# Patient Record
Sex: Female | Born: 1955
Health system: Southern US, Community
[De-identification: ages and names within clinical notes are randomized; demographics above are authoritative.]

## PROBLEM LIST (undated history)

## (undated) DIAGNOSIS — J189 Pneumonia, unspecified organism: Secondary | ICD-10-CM

## (undated) DIAGNOSIS — I10 Essential (primary) hypertension: Secondary | ICD-10-CM

## (undated) DIAGNOSIS — F411 Generalized anxiety disorder: Secondary | ICD-10-CM

## (undated) DIAGNOSIS — G459 Transient cerebral ischemic attack, unspecified: Secondary | ICD-10-CM

## (undated) DIAGNOSIS — J90 Pleural effusion, not elsewhere classified: Secondary | ICD-10-CM

## (undated) DIAGNOSIS — M1990 Unspecified osteoarthritis, unspecified site: Secondary | ICD-10-CM

## (undated) DIAGNOSIS — I700 Atherosclerosis of aorta: Secondary | ICD-10-CM

## (undated) DIAGNOSIS — R0609 Other forms of dyspnea: Secondary | ICD-10-CM

## (undated) DIAGNOSIS — C3492 Malignant neoplasm of unspecified part of left bronchus or lung: Secondary | ICD-10-CM

## (undated) DIAGNOSIS — E785 Hyperlipidemia, unspecified: Secondary | ICD-10-CM

## (undated) HISTORY — PX: NEPHRECTOMY: SHX65

## (undated) HISTORY — DX: Pleural effusion, not elsewhere classified: J90

## (undated) HISTORY — DX: Other forms of dyspnea: R06.09

## (undated) HISTORY — DX: Pneumonia, unspecified organism: J18.9

## (undated) HISTORY — DX: Malignant neoplasm of unspecified part of left bronchus or lung: C34.92

## (undated) HISTORY — DX: Transient cerebral ischemic attack, unspecified: G45.9

## (undated) HISTORY — DX: Atherosclerosis of aorta: I70.0

## (undated) HISTORY — DX: Hyperlipidemia, unspecified: E78.5

## (undated) HISTORY — DX: Essential (primary) hypertension: I10

## (undated) HISTORY — DX: Generalized anxiety disorder: F41.1

---

## 1997-10-05 ENCOUNTER — Emergency Department (HOSPITAL_COMMUNITY): Admission: EM | Admit: 1997-10-05 | Discharge: 1997-10-05 | Payer: Self-pay | Admitting: *Deleted

## 1999-09-08 ENCOUNTER — Other Ambulatory Visit: Admission: RE | Admit: 1999-09-08 | Discharge: 1999-09-08 | Payer: Self-pay | Admitting: Obstetrics and Gynecology

## 1999-09-17 ENCOUNTER — Ambulatory Visit (HOSPITAL_COMMUNITY): Admission: RE | Admit: 1999-09-17 | Discharge: 1999-09-17 | Payer: Self-pay | Admitting: Gastroenterology

## 1999-09-17 ENCOUNTER — Encounter: Payer: Self-pay | Admitting: Obstetrics & Gynecology

## 2016-04-29 DIAGNOSIS — Z1231 Encounter for screening mammogram for malignant neoplasm of breast: Secondary | ICD-10-CM | POA: Diagnosis not present

## 2016-04-29 DIAGNOSIS — M8589 Other specified disorders of bone density and structure, multiple sites: Secondary | ICD-10-CM | POA: Diagnosis not present

## 2016-04-29 DIAGNOSIS — M8588 Other specified disorders of bone density and structure, other site: Secondary | ICD-10-CM | POA: Diagnosis not present

## 2016-08-03 DIAGNOSIS — H6123 Impacted cerumen, bilateral: Secondary | ICD-10-CM | POA: Diagnosis not present

## 2016-09-17 DIAGNOSIS — N3090 Cystitis, unspecified without hematuria: Secondary | ICD-10-CM | POA: Diagnosis not present

## 2016-09-17 DIAGNOSIS — N3001 Acute cystitis with hematuria: Secondary | ICD-10-CM | POA: Diagnosis not present

## 2016-10-06 DIAGNOSIS — Z1389 Encounter for screening for other disorder: Secondary | ICD-10-CM | POA: Diagnosis not present

## 2016-10-06 DIAGNOSIS — I10 Essential (primary) hypertension: Secondary | ICD-10-CM | POA: Diagnosis not present

## 2016-10-06 DIAGNOSIS — E785 Hyperlipidemia, unspecified: Secondary | ICD-10-CM | POA: Diagnosis not present

## 2016-10-06 DIAGNOSIS — E119 Type 2 diabetes mellitus without complications: Secondary | ICD-10-CM | POA: Diagnosis not present

## 2017-01-25 DIAGNOSIS — H524 Presbyopia: Secondary | ICD-10-CM | POA: Diagnosis not present

## 2017-01-25 DIAGNOSIS — H2512 Age-related nuclear cataract, left eye: Secondary | ICD-10-CM | POA: Diagnosis not present

## 2017-02-25 DIAGNOSIS — N3001 Acute cystitis with hematuria: Secondary | ICD-10-CM | POA: Diagnosis not present

## 2017-03-03 DIAGNOSIS — N210 Calculus in bladder: Secondary | ICD-10-CM | POA: Diagnosis not present

## 2017-03-03 DIAGNOSIS — N1330 Unspecified hydronephrosis: Secondary | ICD-10-CM | POA: Insufficient documentation

## 2017-03-04 DIAGNOSIS — N1330 Unspecified hydronephrosis: Secondary | ICD-10-CM | POA: Diagnosis not present

## 2017-03-04 DIAGNOSIS — N210 Calculus in bladder: Secondary | ICD-10-CM | POA: Diagnosis not present

## 2017-03-04 DIAGNOSIS — R319 Hematuria, unspecified: Secondary | ICD-10-CM | POA: Diagnosis not present

## 2017-03-23 DIAGNOSIS — Z22322 Carrier or suspected carrier of Methicillin resistant Staphylococcus aureus: Secondary | ICD-10-CM | POA: Insufficient documentation

## 2017-03-30 DIAGNOSIS — N1330 Unspecified hydronephrosis: Secondary | ICD-10-CM | POA: Diagnosis not present

## 2017-11-05 DIAGNOSIS — F411 Generalized anxiety disorder: Secondary | ICD-10-CM | POA: Diagnosis not present

## 2017-11-05 DIAGNOSIS — E559 Vitamin D deficiency, unspecified: Secondary | ICD-10-CM | POA: Diagnosis not present

## 2017-11-05 DIAGNOSIS — D539 Nutritional anemia, unspecified: Secondary | ICD-10-CM | POA: Diagnosis not present

## 2017-11-05 DIAGNOSIS — E119 Type 2 diabetes mellitus without complications: Secondary | ICD-10-CM | POA: Diagnosis not present

## 2017-11-05 DIAGNOSIS — E785 Hyperlipidemia, unspecified: Secondary | ICD-10-CM | POA: Diagnosis not present

## 2017-11-05 DIAGNOSIS — I10 Essential (primary) hypertension: Secondary | ICD-10-CM | POA: Diagnosis not present

## 2018-05-09 DIAGNOSIS — I10 Essential (primary) hypertension: Secondary | ICD-10-CM | POA: Diagnosis not present

## 2018-05-09 DIAGNOSIS — E119 Type 2 diabetes mellitus without complications: Secondary | ICD-10-CM | POA: Diagnosis not present

## 2018-05-09 DIAGNOSIS — D539 Nutritional anemia, unspecified: Secondary | ICD-10-CM | POA: Diagnosis not present

## 2018-05-09 DIAGNOSIS — Z1331 Encounter for screening for depression: Secondary | ICD-10-CM | POA: Diagnosis not present

## 2018-05-09 DIAGNOSIS — E785 Hyperlipidemia, unspecified: Secondary | ICD-10-CM | POA: Diagnosis not present

## 2018-07-22 DIAGNOSIS — Z1231 Encounter for screening mammogram for malignant neoplasm of breast: Secondary | ICD-10-CM | POA: Diagnosis not present

## 2018-07-22 DIAGNOSIS — M85851 Other specified disorders of bone density and structure, right thigh: Secondary | ICD-10-CM | POA: Diagnosis not present

## 2018-07-22 DIAGNOSIS — M8588 Other specified disorders of bone density and structure, other site: Secondary | ICD-10-CM | POA: Diagnosis not present

## 2018-07-22 DIAGNOSIS — M8589 Other specified disorders of bone density and structure, multiple sites: Secondary | ICD-10-CM | POA: Diagnosis not present

## 2018-11-15 DIAGNOSIS — E785 Hyperlipidemia, unspecified: Secondary | ICD-10-CM | POA: Diagnosis not present

## 2018-11-15 DIAGNOSIS — I10 Essential (primary) hypertension: Secondary | ICD-10-CM | POA: Diagnosis not present

## 2018-11-15 DIAGNOSIS — E119 Type 2 diabetes mellitus without complications: Secondary | ICD-10-CM | POA: Diagnosis not present

## 2018-11-15 DIAGNOSIS — E559 Vitamin D deficiency, unspecified: Secondary | ICD-10-CM | POA: Diagnosis not present

## 2018-11-15 DIAGNOSIS — F411 Generalized anxiety disorder: Secondary | ICD-10-CM | POA: Diagnosis not present

## 2019-06-16 LAB — COLOGUARD: COLOGUARD: NEGATIVE

## 2019-12-08 ENCOUNTER — Institutional Professional Consult (permissible substitution): Payer: Self-pay | Admitting: Emergency Medicine

## 2019-12-11 ENCOUNTER — Ambulatory Visit (INDEPENDENT_AMBULATORY_CARE_PROVIDER_SITE_OTHER): Payer: Medicare Other | Admitting: Pulmonary Disease

## 2019-12-11 ENCOUNTER — Other Ambulatory Visit: Payer: Self-pay

## 2019-12-11 ENCOUNTER — Encounter: Payer: Self-pay | Admitting: Pulmonary Disease

## 2019-12-11 VITALS — BP 124/72 | HR 93 | Temp 97.8°F | Ht 64.5 in | Wt 169.2 lb

## 2019-12-11 DIAGNOSIS — C3492 Malignant neoplasm of unspecified part of left bronchus or lung: Secondary | ICD-10-CM | POA: Diagnosis not present

## 2019-12-11 DIAGNOSIS — J910 Malignant pleural effusion: Secondary | ICD-10-CM

## 2019-12-11 NOTE — Patient Instructions (Addendum)
- We will refer you to medical oncology for further evaluation and treatment of lung cancer - We will schedule you for CT PET scan and Brain MRI at [facility] - Should your shortness of breath worsen we can perform repeat thoracentesis or place a PleurX catheter to remove fluid    Indwelling Pleural Catheter Home Guide  An indwelling pleural catheter is a thin, flexible tube that is inserted under your skin and into your chest. The catheter drains excess fluid that collects in the area between the chest wall and the lungs (pleural space).After the catheter is inserted, it can be attached to a bottle that collects fluid. The pleural catheter will allow you to drain fluid from your chest at home on a regular basis (sometimes daily). This will eliminate the need for frequent visits to the hospital or clinic to drain the fluid. The catheter may be removed after the excess fluid problem is resolved, usually after 2-3 months. It is important to follow instructions from your health care provider about how to drain and care for your catheter. What are the risks? Generally, this is a safe procedure. However, problems may occur, including:  Infection.  Skin damage around the catheter.  Lung damage.  Failure of the chest tube to work properly.  Spreading of cancer cells along the catheter, if you have cancer. Supplies needed:  Vacuum-sealed drainage bottle with attached drainage line.  Sterile dressing.  Sterile alcohol pads.  Sterile gloves.  Valve cap.  Sterile gauze pads, 4  4 inch (10 cm  10 cm).  Tape.  Adhesive dressing.  Sterile foam catheter pad. How to care for your catheter and insertion site  Wash your hands with soap and warm water before and after touching the catheter or insertion site. If soap and water are not available, use hand sanitizer.  Check your bandage (dressing) daily to make sure it is clean and dry.  Keep the skin around the catheter clean  and dry.  Check the catheter regularly for any cracks or kinks in the tubing.  Check your catheter insertion site every day for signs of infection. Check for: ? Skin breakdown. ? Redness, swelling, or pain. ? Fluid or blood. ? Warmth. ? Pus or a bad smell. How to drain your catheter You may need to drain your catheter every day, or more or less often as told by your health care provider. Follow instructions from your health care provider about how to drain your catheter. You may also refer to instructions that come with the drainage system. To drain the catheter: 1. Wash your hands with soap and warm water. If soap and water are not available, use hand sanitizer. 2. Carefully remove the dressing from around the catheter. 3. Wash your hands again. 4. Put on the gloves provided. 5. Prepare the vacuum-sealed drainage bottle and drainage line. Close the drainage line of the vacuum-sealed drainage bottle by squeezing the pinch clamp or rolling the wheel of the roller clamp toward the bottle. The vacuum in the bottle will be lost if the line is not closed completely. 6. Remove the access tip cover from the drainage line. Do not touch the end. Set it on a sterile surface. 7. Remove the catheter valve cap and throw it away. 8. Use an alcohol pad to clean the end of the catheter. 9. Insert the access tip into the catheter valve. Make sure the valve and access tip are securely connected. Listen for a click to confirm that  they are connected. 10. Insert the T plunger to break the vacuum seal on the drainage bottle. 11. Open the clamp on the drainage line. 12. Allow the catheter to drain. Keep the catheter and the drainage bottle below the level of your chest. There may be a one-way valve on the end of the tubing that will allow liquid and air to flow out of the catheter without letting air inside. 13. Drain the amount of fluid as told by your health care provider. It usually takes 5-15 minutes. Do not  drain more than 1000 mL of fluid. You may feel a little discomfort while you are draining. If the pain is severe, stop draining and contact your health care provider. 14. After you finish draining the catheter, remove the drainage bottle tubing from the catheter. 15. Use a clean alcohol pad to wipe the catheter tip. 16. Place a clean cap on the end of the catheter. 17. Use an alcohol pad to clean the skin around the catheter. 18. Allow the skin to air-dry. 19. Put the catheter pad on your skin. Curl the catheter into loops and place it on the pad. Do not place the catheter on your skin. 20. Replace the dressing over the catheter. 21. Discard the drainage bottle as instructed by your health care provider. Do not reuse the drainage bottle. How to change your dressing Change your dressing at least once a week, or more often if needed to keep the dressing dry. Be sure to change the dressing whenever it becomes moist. Your health care provider will tell you how often to change your dressing. 1. Wash your hands with soap and warm water. If soap and water are not available, use hand sanitizer. 2. Gently remove the old dressing. Avoid using scissors to remove the dressing. Sharp objects may damage the catheter. 3. Wash the skin around the insertion site with mild, fragrance-free soap and warm water. Rinse well, then pat the area dry with a clean cloth. 4. Check the skin around the catheter for signs of infection. Check for: ? Skin breakdown. ? Redness, swelling, or pain. ? Fluid or blood. ? Warmth. ? Pus or a bad smell. 5. If your catheter was stitched (sutured) to your skin, look at the suture to make sure it is still anchored in your skin. 6. Do not apply creams, ointments, or alcohol to the area. Let your skin air-dry completely before you apply a new dressing. 7. Curl the catheter into loops and place it on the sterile catheter pad. Do not place the catheter on your skin. 8. If you do not have a  pad, use a clean dressing. Slide the dressing under the disk that holds the drainage catheter in place. 9. Use gauze to cover the catheter and the catheter pad. The catheter should rest on the pad or dressing, not on your skin. 10. Tape the dressing to your skin. You may be instructed to use an adhesive dressing covering instead of gauze and tape. 11. Wash your hands with soap and warm water. If soap and water are not available, use hand sanitizer. General recommendations  Always wash your hands with soap and warm water before and after caring for your catheter and drainage bottle. Use a mild, fragrance-free soap. If soap and water are not available, use hand sanitizer.  Always make sure there are no leaks in the catheter or drainage bottle.  Each time you drain the catheter, note the color and amount of fluid.  Do not  touch the tip of the catheter or the drainage bottle tubing.  Do not reuse drainage bottles.  Do not take baths, swim, or use a hot tub until your health care provider approves. Ask your health care provider if you may take showers. You may only be allowed to take sponge baths.  Take deep breaths regularly, followed by a cough. Doing this can help to prevent lung infection. Contact a health care provider if:  You have any questions about caring for your catheter or drainage bottle.  You still have pain at the catheter insertion site more than 2 days after your procedure.  You have pain while draining your catheter.  Your catheter becomes bent, twisted, or cracked.  The connection between the catheter and the collection bottle becomes loose.  You have any of these around your catheter insertion site or coming from it: ? Skin breakdown. ? Redness, swelling, or pain. ? Fluid or blood. ? Warmth. ? Pus or a bad smell. Get help right away if:  You have a fever or chills.  You have chest pain.  You have dizziness or shortness of breath.  You have severe redness,  swelling, or pain at your catheter insertion site.  The catheter comes out.  The catheter is blocked or clogged. Summary  An indwelling pleural catheter is a thin, flexible tube that is inserted under your skin and into your chest. The catheter drains excess fluid that collects in the area between the chest wall and the lungs (pleural space).  It is important to follow instructions from your health care provider about how to drain and care for your catheter.  Do not touch the tip of the catheter or the drainage bottle tubing.  Always wash your hands with soap and water before and after caring for your catheter and drainage bottle. If soap and water are not available, use hand sanitizer. This information is not intended to replace advice given to you by your health care provider. Make sure you discuss any questions you have with your health care provider. Document Revised: 06/17/2018 Document Reviewed: 06/18/2016 Elsevier Patient Education  [place].

## 2019-12-11 NOTE — Progress Notes (Signed)
Synopsis: Referred by Laverna Peace, NP for Lung Cancer  Subjective:   PATIENT ID: Gabrielle [name] GENDER: female DOB: 1955/09/16, MRN: [id number]   HPI  Chief Complaint  Patient presents with  . Consult    had 1L of fluid drained from left lung, belives she was told it was cancer increased shortness of breath with more activity but usually O2 sats stay 93-96% denies any coughing    Gabrielle [name] is a 64 year old woman, former smoker with history of hypertension and hyperlipidemia who is referred to pulmonary clinic for adenocarcinoma of the lung.   Patient reports developing shortness of breath with exertion in August and went to her primary care in September for evaluation. Chest radiograph was performed on 11/23/19 which showed large left pleural effusion with compressive atelectasis. She then had ultrasound guided thoracentesis by IR on 11/27/19 with drainage of 1,268mL of dark maroon colored fluid. Pathology report on 12/01/19 resulted with malignant cells consistent with adenocarcinoma of the lung.   She reports experiencing some relief of her shortness of breath after the thoracentesis. She currently experiences dyspnea with exertion but she is not limited from completing her daily activities. She denies cough, hemoptysis or wheezing. She denies chest discomfort and lower extremity edema. She denies fevers, chills or sweats. She has lost 30lbs this year and reports a persistent 'full' feeling in her abdomen, but she has also intentionally modified her diet as she has tried to work on losing weight over this year.  She is a former smoker. She quit 5 years ago. She smoked 1 pack per day from age 36 to 28, so she has a 39 pack year smoking history. She has no history of hazardous occupational exposures.    Past Medical History:  Diagnosis Date  . Hyperlipidemia   . Hypertension   . Pleural effusion   . Pneumonia      Family History  Problem Relation Age of Onset  . COPD Mother        Social History   Socioeconomic History  . Marital status: Married    Spouse name: Not on file  . Number of children: Not on file  . Years of education: Not on file  . Highest education level: Not on file  Occupational History  . Not on file  Tobacco Use  . Smoking status: Former Smoker    Packs/day: 1.00    Years: 40.00    Pack years: 40.00    Types: Cigarettes    Quit date: 2016    Years since quitting: 5.7  . Smokeless tobacco: Never Used  Substance and Sexual Activity  . Alcohol use: Not on file  . Drug use: Not on file  . Sexual activity: Not on file  Other Topics Concern  . Not on file  Social History Narrative  . Not on file   Social Determinants of Health   Financial Resource Strain:   . Difficulty of Paying Living Expenses: Not on file  Food Insecurity:   . Worried About Charity fundraiser in the Last Year: Not on file  . Ran Out of Food in the Last Year: Not on file  Transportation Needs:   . Lack of Transportation (Medical): Not on file  . Lack of Transportation (Non-Medical): Not on file  Physical Activity:   . Days of Exercise per Week: Not on file  . Minutes of Exercise per Session: Not on file  Stress:   . Feeling of Stress : Not  on file  Social Connections:   . Frequency of Communication with Friends and Family: Not on file  . Frequency of Social Gatherings with Friends and Family: Not on file  . Attends Religious Services: Not on file  . Active Member of Clubs or Organizations: Not on file  . Attends Archivist Meetings: Not on file  . Marital Status: Not on file  Intimate Partner Violence:   . Fear of Current or Ex-Partner: Not on file  . Emotionally Abused: Not on file  . Physically Abused: Not on file  . Sexually Abused: Not on file     Allergies  Allergen Reactions  . Ace Inhibitors Cough     Outpatient Medications Prior to Visit  Medication Sig Dispense Refill  . cholecalciferol (VITAMIN D3) 25 MCG (1000 UNIT) tablet  Take 2,000 Units by mouth daily.    Marland Kitchen losartan-hydrochlorothiazide (HYZAAR) 100-12.5 MG tablet Take 1 tablet by mouth daily.    . metFORMIN (GLUCOPHAGE) 500 MG tablet Take 500 mg by mouth 2 (two) times daily with a meal.    . pravastatin (PRAVACHOL) 40 MG tablet Take 40 mg by mouth daily.    . sertraline (ZOLOFT) 50 MG tablet Take 75 mg by mouth daily.     No facility-administered medications prior to visit.    Review of Systems  Constitutional: Positive for weight loss. Negative for chills, diaphoresis, fever and malaise/fatigue.  HENT: Negative for congestion, nosebleeds, sinus pain and sore throat.   Eyes: Negative for blurred vision.  Respiratory: Positive for shortness of breath. Negative for cough, hemoptysis, sputum production and wheezing.   Cardiovascular: Negative for chest pain, palpitations, orthopnea, claudication, leg swelling and PND.  Gastrointestinal: Negative for abdominal pain, blood in stool, constipation, diarrhea, heartburn, melena, nausea and vomiting.  Genitourinary: Negative for dysuria and hematuria.  Musculoskeletal: Negative for joint pain and myalgias.  Skin: Negative for itching and rash.  Neurological: Negative for dizziness and weakness.  Endo/Heme/Allergies: Does not bruise/bleed easily.  Psychiatric/Behavioral: Negative.    Objective:   Vitals:   12/11/19 1527  BP: 124/72  Pulse: 93  Temp: 97.8 F (36.6 C)  TempSrc: Temporal  SpO2: 94%  Weight: 169 lb 3.2 oz (76.7 kg)  Height: 5' 4.5" (1.638 m)     Physical Exam Constitutional:      General: She is not in acute distress.    Appearance: Normal appearance. She is normal weight. She is not ill-appearing.  HENT:     Head: Normocephalic and atraumatic.     Nose: Nose normal.     Mouth/Throat:     Mouth: Mucous membranes are dry.     Pharynx: Oropharynx is clear.  Eyes:     General: No scleral icterus.    Extraocular Movements: Extraocular movements intact.     Conjunctiva/sclera:  Conjunctivae normal.     Pupils: Pupils are equal, round, and reactive to light.  Cardiovascular:     Rate and Rhythm: Normal rate and regular rhythm.     Pulses: Normal pulses.     Heart sounds: Normal heart sounds. No murmur heard.   Pulmonary:     Effort: Pulmonary effort is normal.     Breath sounds: Examination of the left-middle field reveals decreased breath sounds. Examination of the left-lower field reveals decreased breath sounds. Decreased breath sounds present.  Abdominal:     General: Abdomen is flat. Bowel sounds are normal.     Palpations: Abdomen is soft.  Musculoskeletal:     Cervical back:  Neck supple.     Right lower leg: No edema.     Left lower leg: No edema.  Lymphadenopathy:     Cervical: No cervical adenopathy.  Skin:    Findings: No lesion or rash.  Neurological:     General: No focal deficit present.     Mental Status: She is alert and oriented to person, place, and time. Mental status is at baseline.  Psychiatric:        Behavior: Behavior normal.        Thought Content: Thought content normal.        Judgment: Judgment normal.    Chest imaging: 12/06/19 CT Chest: No mediastinal or hilar adenopathy Large left pleural effusion with some components appearing loculated. Subtle pleural nodularity without discrete enhancing pleural mass. There is significant compressive atelectasis of the left lung.  The right lung is clear.  Left kidney is surgically absent. No upper abdominal adenopathy. No chest wall mass, breast masses, supraclavicular or axillary adenopathy. The thyroid gland is grossly normal.    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   She has metastatic adenocarcinoma of the lung as diagnosis was made from pleural fluid sample at [facility] on 12/01/19. We will order a CT PET scan and Brain MRI to complete the staging imaging.   She will be referred to medical oncology for further evaluation and treatment. I will try to add her case to the 10/7 Thoracic Cancer Conference meeting.   In regards to the malignant pleural effusion, we discussed options for repeat thoracentesis and/or PleurX placement for relief of dyspnea. At this time, she is functionally active and able to perform her daily tasks as needed without limitation. Should her dyspnea progress or become and physically limiting factor then we can move forward with pleurX placement. I have provided her with information on the pleurX catheter in the after visit summary.   Follow up as needed.  A total of 65 minutes was spent in direct contact with the patient and her husband along with coordinating care and documentation.  Freda Jackson, MD [facility] Office: [phone]   Current Outpatient Medications:  .  cholecalciferol (VITAMIN D3) 25 MCG (1000 UNIT) tablet, Take 2,000 Units by mouth daily., Disp: , Rfl:  .  losartan-hydrochlorothiazide (HYZAAR) 100-12.5 MG tablet, Take 1 tablet by mouth daily., Disp: , Rfl:  .  metFORMIN (GLUCOPHAGE) 500 MG tablet, Take 500 mg by mouth 2 (two) times daily with a meal., Disp: , Rfl:  .  pravastatin (PRAVACHOL) 40 MG tablet, Take 40 mg by mouth daily., Disp: , Rfl:  .  sertraline (ZOLOFT) 50 MG tablet, Take 75 mg by mouth daily., Disp: , Rfl:

## 2019-12-12 ENCOUNTER — Encounter: Payer: Self-pay | Admitting: *Deleted

## 2019-12-12 ENCOUNTER — Ambulatory Visit
Admission: RE | Admit: 2019-12-12 | Discharge: 2019-12-12 | Disposition: A | Discharge status: Home / Self Care | Payer: Self-pay | Source: Ambulatory Visit | Attending: Pulmonary Disease | Admitting: Pulmonary Disease

## 2019-12-12 DIAGNOSIS — C3492 Malignant neoplasm of unspecified part of left bronchus or lung: Secondary | ICD-10-CM

## 2019-12-12 DIAGNOSIS — J910 Malignant pleural effusion: Secondary | ICD-10-CM

## 2019-12-12 NOTE — Progress Notes (Signed)
I received referral on Gabrielle [name] today.  Due to her work up at [facility], I updated scheduling to call and schedule her to be seen with Dr. Hinton Rao or Dr. Bobby Rumpf.

## 2019-12-12 NOTE — Addendum Note (Signed)
Addended by: Amado Coe on: 12/12/2019 03:51 PM   Modules accepted: Orders

## 2019-12-14 ENCOUNTER — Telehealth: Payer: Self-pay | Admitting: *Deleted

## 2019-12-14 ENCOUNTER — Other Ambulatory Visit: Payer: Self-pay | Admitting: *Deleted

## 2019-12-14 NOTE — Progress Notes (Signed)
The proposed treatment discussed in cancer conference 12/14/19 is for discussion purpose only and is not a binding recommendation.  The patient was not physically examined nor present for their treatment options.  Therefore, final treatment plans cannot be decided.

## 2019-12-14 NOTE — Telephone Encounter (Signed)
I called Ms. Bublitz to see if she would like to be seen with Dr. Julien Nordmann in [place] or would she like to be seen at [facility].  I was unable to reach but did leave a vm message with my name and phone number to call.

## 2019-12-15 ENCOUNTER — Telehealth: Payer: Self-pay | Admitting: *Deleted

## 2019-12-15 ENCOUNTER — Encounter: Payer: Self-pay | Admitting: *Deleted

## 2019-12-15 DIAGNOSIS — J910 Malignant pleural effusion: Secondary | ICD-10-CM

## 2019-12-15 NOTE — Telephone Encounter (Signed)
I called Gabrielle [name] today to inquire where she would like to get treatment.  I was unable to reach but did leave a vm message with my name and phone number.

## 2019-12-15 NOTE — Telephone Encounter (Signed)
Gabrielle [name] called me back. She states she would like to be treated in [place]. I will follow up with Dr. Julien Nordmann and then call her back with an appt.

## 2019-12-15 NOTE — Telephone Encounter (Signed)
I called to update Gabrielle [name] on her appt.  She verbalized understanding.

## 2019-12-15 NOTE — Progress Notes (Signed)
I notified pathology dept to send path from Gabrielle [name] to [place].

## 2019-12-19 ENCOUNTER — Telehealth: Payer: Self-pay | Admitting: Pulmonary Disease

## 2019-12-19 NOTE — Telephone Encounter (Signed)
Records need to be faxed to [phone],  Fax in increments of 25 pages otherwise will be rejected

## 2019-12-21 NOTE — Telephone Encounter (Signed)
Faxing records again Fax confirmation received

## 2019-12-22 ENCOUNTER — Other Ambulatory Visit: Payer: Self-pay

## 2019-12-22 ENCOUNTER — Ambulatory Visit (HOSPITAL_COMMUNITY)
Admission: RE | Admit: 2019-12-22 | Discharge: 2019-12-22 | Disposition: A | Discharge status: Home / Self Care | Payer: Medicare Other | Source: Ambulatory Visit | Attending: Pulmonary Disease | Admitting: Pulmonary Disease

## 2019-12-22 DIAGNOSIS — I700 Atherosclerosis of aorta: Secondary | ICD-10-CM | POA: Insufficient documentation

## 2019-12-22 DIAGNOSIS — J910 Malignant pleural effusion: Secondary | ICD-10-CM | POA: Diagnosis not present

## 2019-12-22 DIAGNOSIS — C3492 Malignant neoplasm of unspecified part of left bronchus or lung: Secondary | ICD-10-CM | POA: Diagnosis present

## 2019-12-22 DIAGNOSIS — I2510 Atherosclerotic heart disease of native coronary artery without angina pectoris: Secondary | ICD-10-CM | POA: Diagnosis not present

## 2019-12-22 DIAGNOSIS — K5730 Diverticulosis of large intestine without perforation or abscess without bleeding: Secondary | ICD-10-CM | POA: Diagnosis not present

## 2019-12-22 LAB — GLUCOSE, CAPILLARY: Glucose-Capillary: 118 mg/dL — ABNORMAL HIGH (ref 70–99)

## 2019-12-22 MED ORDER — FLUDEOXYGLUCOSE F - 18 (FDG) INJECTION
8.2400 | Freq: Once | INTRAVENOUS | Status: AC | PRN
  Administered 2019-12-22: 8.24 via INTRAVENOUS

## 2019-12-26 ENCOUNTER — Other Ambulatory Visit: Payer: Self-pay

## 2019-12-26 ENCOUNTER — Ambulatory Visit (HOSPITAL_COMMUNITY)
Admission: RE | Admit: 2019-12-26 | Discharge: 2019-12-26 | Disposition: A | Discharge status: Home / Self Care | Payer: Medicare Other | Source: Ambulatory Visit | Attending: Pulmonary Disease | Admitting: Pulmonary Disease

## 2019-12-26 ENCOUNTER — Telehealth: Payer: Self-pay | Admitting: Pulmonary Disease

## 2019-12-26 DIAGNOSIS — C3492 Malignant neoplasm of unspecified part of left bronchus or lung: Secondary | ICD-10-CM | POA: Insufficient documentation

## 2019-12-26 MED ORDER — GADOBUTROL 1 MMOL/ML IV SOLN
7.0000 mL | Freq: Once | INTRAVENOUS | Status: AC | PRN
  Administered 2019-12-26: 7 mL via INTRAVENOUS

## 2019-12-26 NOTE — Telephone Encounter (Signed)
Spoke with Linus Orn from [facility]iology       IMPRESSION: 1.4 x 1.3 cm enhancing dural-based mass overlying the left frontal operculum. While this may reflect a meningioma, a dural-based metastatic lesion cannot be excluded. Mild mass effect upon the underlying left frontal lobe with mild parenchymal edema.  4 mm indeterminate enhancing lesion within the right frontal calvarium for which a small osseous metastasis cannot be excluded. Attention recommended on follow-up.  Mild chronic small vessel ischemic disease. This includes a small chronic left thalamic lacunar infarct.  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   Electronically Signed   By: Kellie Simmering DO   On: 12/26/2019 15:04

## 2019-12-26 NOTE — Telephone Encounter (Signed)
Thank you. She has follow up with heme/onc on 12/28/19.

## 2019-12-28 ENCOUNTER — Encounter: Payer: Self-pay | Admitting: *Deleted

## 2019-12-28 ENCOUNTER — Inpatient Hospital Stay: Payer: Medicare Other | Attending: Internal Medicine | Admitting: Internal Medicine

## 2019-12-28 ENCOUNTER — Telehealth: Payer: Self-pay | Admitting: *Deleted

## 2019-12-28 ENCOUNTER — Inpatient Hospital Stay: Payer: Medicare Other

## 2019-12-28 ENCOUNTER — Other Ambulatory Visit: Payer: Self-pay | Admitting: *Deleted

## 2019-12-28 ENCOUNTER — Other Ambulatory Visit: Payer: Self-pay

## 2019-12-28 ENCOUNTER — Encounter: Payer: Self-pay | Admitting: Internal Medicine

## 2019-12-28 VITALS — BP 147/73 | HR 81 | Temp 97.6°F | Resp 18 | Ht 64.5 in | Wt 169.0 lb

## 2019-12-28 DIAGNOSIS — Z8051 Family history of malignant neoplasm of kidney: Secondary | ICD-10-CM | POA: Diagnosis not present

## 2019-12-28 DIAGNOSIS — Z7984 Long term (current) use of oral hypoglycemic drugs: Secondary | ICD-10-CM | POA: Insufficient documentation

## 2019-12-28 DIAGNOSIS — Z8249 Family history of ischemic heart disease and other diseases of the circulatory system: Secondary | ICD-10-CM | POA: Diagnosis not present

## 2019-12-28 DIAGNOSIS — I2510 Atherosclerotic heart disease of native coronary artery without angina pectoris: Secondary | ICD-10-CM | POA: Insufficient documentation

## 2019-12-28 DIAGNOSIS — Z905 Acquired absence of kidney: Secondary | ICD-10-CM | POA: Insufficient documentation

## 2019-12-28 DIAGNOSIS — Z7189 Other specified counseling: Secondary | ICD-10-CM

## 2019-12-28 DIAGNOSIS — E119 Type 2 diabetes mellitus without complications: Secondary | ICD-10-CM | POA: Diagnosis not present

## 2019-12-28 DIAGNOSIS — Z79899 Other long term (current) drug therapy: Secondary | ICD-10-CM | POA: Diagnosis not present

## 2019-12-28 DIAGNOSIS — J90 Pleural effusion, not elsewhere classified: Secondary | ICD-10-CM | POA: Diagnosis not present

## 2019-12-28 DIAGNOSIS — E785 Hyperlipidemia, unspecified: Secondary | ICD-10-CM | POA: Insufficient documentation

## 2019-12-28 DIAGNOSIS — Z87891 Personal history of nicotine dependence: Secondary | ICD-10-CM | POA: Diagnosis not present

## 2019-12-28 DIAGNOSIS — C3432 Malignant neoplasm of lower lobe, left bronchus or lung: Secondary | ICD-10-CM | POA: Diagnosis present

## 2019-12-28 DIAGNOSIS — Z5111 Encounter for antineoplastic chemotherapy: Secondary | ICD-10-CM

## 2019-12-28 DIAGNOSIS — J910 Malignant pleural effusion: Secondary | ICD-10-CM

## 2019-12-28 DIAGNOSIS — I10 Essential (primary) hypertension: Secondary | ICD-10-CM | POA: Insufficient documentation

## 2019-12-28 DIAGNOSIS — C3492 Malignant neoplasm of unspecified part of left bronchus or lung: Secondary | ICD-10-CM

## 2019-12-28 LAB — CBC WITH DIFFERENTIAL (CANCER CENTER ONLY)
Abs Immature Granulocytes: 0.01 10*3/uL (ref 0.00–0.07)
Basophils Absolute: 0.1 10*3/uL (ref 0.0–0.1)
Basophils Relative: 1 %
Eosinophils Absolute: 0.2 10*3/uL (ref 0.0–0.5)
Eosinophils Relative: 3 %
HCT: 39 % (ref 36.0–46.0)
Hemoglobin: 12.8 g/dL (ref 12.0–15.0)
Immature Granulocytes: 0 %
Lymphocytes Relative: 16 %
Lymphs Abs: 1 10*3/uL (ref 0.7–4.0)
MCH: 30.3 pg (ref 26.0–34.0)
MCHC: 32.8 g/dL (ref 30.0–36.0)
MCV: 92.4 fL (ref 80.0–100.0)
Monocytes Absolute: 0.5 10*3/uL (ref 0.1–1.0)
Monocytes Relative: 7 %
Neutro Abs: 4.8 10*3/uL (ref 1.7–7.7)
Neutrophils Relative %: 73 %
Platelet Count: 259 10*3/uL (ref 150–400)
RBC: 4.22 MIL/uL (ref 3.87–5.11)
RDW: 13.4 % (ref 11.5–15.5)
WBC Count: 6.5 10*3/uL (ref 4.0–10.5)
nRBC: 0 % (ref 0.0–0.2)

## 2019-12-28 LAB — CMP (CANCER CENTER ONLY)
ALT: 13 U/L (ref 0–44)
AST: 12 U/L — ABNORMAL LOW (ref 15–41)
Albumin: 3.8 g/dL (ref 3.5–5.0)
Alkaline Phosphatase: 49 U/L (ref 38–126)
Anion gap: 8 (ref 5–15)
BUN: 20 mg/dL (ref 8–23)
CO2: 30 mmol/L (ref 22–32)
Calcium: 10.3 mg/dL (ref 8.9–10.3)
Chloride: 104 mmol/L (ref 98–111)
Creatinine: 1.08 mg/dL — ABNORMAL HIGH (ref 0.44–1.00)
GFR, Estimated: 57 mL/min — ABNORMAL LOW (ref >60)
Glucose, Bld: 109 mg/dL — ABNORMAL HIGH (ref 70–99)
Potassium: 4 mmol/L (ref 3.5–5.1)
Sodium: 142 mmol/L (ref 135–145)
Total Bilirubin: 0.3 mg/dL (ref 0.3–1.2)
Total Protein: 7.3 g/dL (ref 6.5–8.1)

## 2019-12-28 NOTE — Progress Notes (Signed)
The proposed treatment discussed in cancer conference is for discussion purpose only and is not a binding recommendation. The patient was not physically examined nor present for their treatment options. Therefore, final treatment plans cannot be decided.  ?

## 2019-12-28 NOTE — Progress Notes (Signed)
Oncology Nurse Navigator Documentation  Oncology Nurse Navigator Flowsheets 12/28/2019  Abnormal Finding Date 12/22/2019  Diagnosis Status Pending Molecular Studies  Planned Course of Treatment Chemotherapy;Targeted Therapy  Navigation Complete Date: 01/01/2020  Post Navigation: Continue to Follow Patient? Yes  Navigator Location [facility]  Navigator Encounter Type Clinic/MDC  Multidisiplinary Clinic Date 12/28/2019  Multidisiplinary Clinic Type Thoracic  Patient Visit Type MedOnc  Treatment Phase Pre-Tx/Tx Discussion  Barriers/Navigation Needs Cultural Needs;Education  Education Newly Diagnosed Cancer Education;Other  Interventions Coordination of Care;Education;Psycho-Social Support  Acuity Level 2-Minimal Needs (1-2 Barriers Identified)  Coordination of Care Other  Education Method Verbal;Written  Time Spent with Patient 48

## 2019-12-28 NOTE — Progress Notes (Signed)
[place] Telephone:([phone]) [phone]   Fax:(336) [phone] Multidisciplinary thoracic oncology clinic  CONSULT NOTE  REFERRING PHYSICIAN: Dr. Freda Jackson  REASON FOR CONSULTATION:  64 years old white female recently diagnosed with lung cancer.  HPI Gabrielle [name] is a 64 y.o. female with past medical history significant for hypertension, dyslipidemia, diabetes mellitus, left nephrectomy for chronic infection and atrophy as well as long history for smoking but quit 2016.  The patient mentioned that she has been complaining of shortness of breath since July 2021.  She was seen by her primary care physician and chest x-ray on 11/23/2019 showed large left pleural effusion.  The patient underwent ultrasound-guided left thoracentesis with drainage of 1200 mL of pleural fluid and the cytology was consistent with metastatic adenocarcinoma.  The patient had CT scan of the chest on 12/06/2019 and it showed large left pleural effusion with subpleural nodularity and pleural-based nodules.  She was referred to Dr. Erin Fulling and she had a PET scan performed on 12/22/2019 and it showed suggestion of a hypermetabolic 5.3 cm centrally necrotic left lower lobe lung mass compatible with the primary bronchogenic carcinoma.  There was also suggestion of a hypermetabolic 2.7 cm pulmonary nodule in the lingula suspicious for metastasis with complete left lower lobe and lingular atelectasis.  The PET scan also showed large malignant left pleural effusion with diffuse left pleural thickening and hypermetabolism.  There was no hypermetabolic thoracic adenopathy or extrathoracic metastatic disease.  The scan also showed hypermetabolic small bilateral parotid gland nodules the largest measure 1.4 cm on the right most suggestive of Warthin's tumor.  She also had MRI of the brain on 12/26/2019 that showed 1.4 x 1.3 cm enhancing dural based mass overlying the left frontal operculum suggestive of meningioma but dural  based metastatic lesion could not be excluded.  There was also a 4 mm indeterminate enhancing lesion within the right frontal calvarium for which small osseous metastasis could not be excluded. The patient was referred to me today for evaluation and recommendation regarding treatment of her condition. When seen today she is feeling fine except for the left-sided chest pain and shortness of breath at baseline increased with exertion but no significant cough or hemoptysis.  She lost around 20 pounds in the last few months.  She has no nausea, vomiting, diarrhea or constipation.  She denied having any headache or visual changes. Family history significant for mother with COPD, father had CHF and hypertension..  Brother had kidney cancer. The patient is married and has 1 daughter.  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  She was accompanied today by her husband weight.  The patient has a history of smoking 1 pack/day for around 30 years and quit in May 2016.  She drinks alcohol occasionally and no history of drug abuse.  HPI  Past Medical History:  Diagnosis Date  . Hyperlipidemia   . Hypertension   . Pleural effusion   . Pneumonia     Past Surgical History:  Procedure Laterality Date  . NEPHRECTOMY Left     Family History  Problem Relation Age of Onset  . COPD Mother     Social History Social History   Tobacco Use  . Smoking status: Former Smoker    Packs/day: 1.00    Years: 40.00    Pack years: 40.00    Types: Cigarettes    Quit date: 2016    Years since quitting: 5.8  . Smokeless tobacco:  Never Used  Substance Use Topics  . Alcohol use: Not on file  . Drug use: Not on file    Allergies  Allergen Reactions  . Ace Inhibitors Cough    Current Outpatient Medications  Medication Sig Dispense Refill  . cholecalciferol (VITAMIN D3) 25 MCG (1000 UNIT) tablet Take 2,000 Units by mouth daily.    Marland Kitchen losartan-hydrochlorothiazide (HYZAAR) 100-12.5 MG tablet Take  1 tablet by mouth daily.    . metFORMIN (GLUCOPHAGE) 500 MG tablet Take 500 mg by mouth 2 (two) times daily with a meal.    . pravastatin (PRAVACHOL) 40 MG tablet Take 40 mg by mouth daily.    . sertraline (ZOLOFT) 50 MG tablet Take 75 mg by mouth daily.     No current facility-administered medications for this visit.    Review of Systems  Constitutional: positive for anorexia, fatigue and weight loss Eyes: negative Ears, nose, mouth, throat, and face: negative Respiratory: positive for dyspnea on exertion and pleurisy/chest pain Cardiovascular: negative Gastrointestinal: negative Genitourinary:negative Integument/breast: negative Hematologic/lymphatic: negative Musculoskeletal:negative Neurological: negative Behavioral/Psych: negative Endocrine: negative Allergic/Immunologic: negative  Physical Exam  DUK:GURKY, healthy, no distress, well nourished and well developed SKIN: skin color, texture, turgor are normal, no rashes or significant lesions HEAD: Normocephalic, No masses, lesions, tenderness or abnormalities EYES: normal, PERRLA, Conjunctiva are pink and non-injected EARS: External ears normal, Canals clear OROPHARYNX:no exudate, no erythema and lips, buccal mucosa, and tongue normal  NECK: supple, no adenopathy, no JVD LYMPH:  no palpable lymphadenopathy, no hepatosplenomegaly BREAST:not examined LUNGS: coarse sounds heard, decreased breath sounds HEART: regular rate & rhythm, no murmurs and no gallops ABDOMEN:abdomen soft, non-tender, normal bowel sounds and no masses or organomegaly BACK: Back symmetric, no curvature., No CVA tenderness EXTREMITIES:no joint deformities, effusion, or inflammation, no edema  NEURO: alert & oriented x 3 with fluent speech, no focal motor/sensory deficits  PERFORMANCE STATUS: ECOG 1  LABORATORY DATA: Lab Results  Component Value Date   WBC 6.5 12/28/2019   HGB 12.8 12/28/2019   HCT 39.0 12/28/2019   MCV 92.4 12/28/2019   PLT 259  12/28/2019      Chemistry   No results found for: NA, K, CL, CO2, BUN, CREATININE, GLU No results found for: CALCIUM, ALKPHOS, AST, ALT, BILITOT     RADIOGRAPHIC STUDIES: MR BRAIN W WO CONTRAST  Addendum Date: 12/26/2019   ADDENDUM REPORT: 12/26/2019 15:04 ADDENDUM: As described on the prior PET-CT of 12/22/2019, there are bilateral 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 Electronically Signed   By: Kellie Simmering DO   On: 12/26/2019 15:04   Result Date: 12/26/2019 CLINICAL DATA:  Adenocarcinoma of left lung. Metastatic disease evaluation. EXAM: MRI HEAD WITHOUT AND WITH CONTRAST TECHNIQUE: Multiplanar, multiecho pulse sequences of the brain and surrounding structures were obtained without and with intravenous contrast. CONTRAST:  24m GADAVIST GADOBUTROL 1 MMOL/ML IV SOLN COMPARISON:  PET-CT 12/22/2019. FINDINGS: Brain: Mild intermittent motion degradation. Cerebral volume is normal for age. There is a 1.4 x 1.3 x 1.1 cm dural-based mass overlying the left frontal calvarium demonstrating relatively homogeneous enhancement as well as a dural tail (series 16, image 82) (series 17, image 17). Mild mass effect upon the underlying left frontal lobe. Additionally, there is mild vasogenic edema within the underlying left frontal lobe. No abnormal intracranial enhancement is identified elsewhere to suggest intracranial metastatic disease. A subcentimeter curvilinear focus of enhancement within the right basal ganglia appears vascular. Additional mild multifocal T2/FLAIR  hyperintensity within the cerebral white matter and pons is nonspecific, but compatible with chronic small vessel ischemic disease. Small chronic left thalamic lacunar infarct. There is no acute infarct. No chronic intracranial blood products. No extra-axial fluid collection. No midline shift. Vascular: Expected proximal arterial flow voids. Skull and upper cervical spine: 4 mm  T1 hypointense and T2/FLAIR hyperintense enhancing focus within the right frontal calvarium (series 16, image 108). No focal marrow lesion identified elsewhere. Sinuses/Orbits: Prior right lens replacement. Visualized orbits show no acute finding. No significant paranasal sinus disease or mastoid effusion. These results will be called to the ordering clinician or representative by the Radiologist Assistant, and communication documented in the PACS or [employer]. IMPRESSION: 1.4 x 1.3 cm enhancing dural-based mass overlying the left frontal operculum. While this may reflect a meningioma, a dural-based metastatic lesion cannot be excluded. Mild mass effect upon the underlying left frontal lobe with mild parenchymal edema. 4 mm indeterminate enhancing lesion within the right frontal calvarium for which a small osseous metastasis cannot be excluded. Attention recommended on follow-up. Mild chronic small vessel ischemic disease. This includes a small chronic left thalamic lacunar infarct. Electronically Signed: By: Kellie Simmering DO On: 12/26/2019 14:59   NM PET Image Initial (PI) Skull Base To Thigh  Result Date: 12/22/2019 CLINICAL DATA:  Initial treatment strategy for non-small cell left lung cancer. EXAM: NUCLEAR MEDICINE PET SKULL BASE TO THIGH TECHNIQUE: 8.2 mCi F-18 FDG was injected intravenously. Full-ring PET imaging was performed from the skull base to thigh after the radiotracer. CT data was obtained and used for attenuation correction and anatomic localization. Fasting blood glucose: 118 mg/dl COMPARISON:  None. FINDINGS: Mediastinal blood pool activity: SUV max 2.9 Liver activity: SUV max NA NECK: There are small hypermetabolic soft tissue nodules in the parotid glands bilaterally, largest 1.4 x 0.9 cm on the right with max SUV 8.5 (series 4/image 21) and 0.9 x 0.6 cm on the left with max SUV 4.0 (series 4/image 23). No enlarged or hypermetabolic lymph nodes in the neck. Incidental CT findings: none  CHEST: Large left pleural effusion with diffuse left pleural thickening and hypermetabolism. Representative focus of hypermetabolism in the medial basilar left pleural space with max SUV 11.1. Suggestion of a hypermetabolic 5.3 x 5.1 cm centrally necrotic left lower lobe lung mass with max SUV 12.3 (series 4/image 86). Complete left lower lobe atelectasis. Suggestion of a separate 2.7 cm hypermetabolic pulmonary nodule in the lingula with max SUV 13.4 (series 4/image 76). Complete lingular atelectasis. No enlarged or hypermetabolic axillary, mediastinal or hilar lymph nodes. No hypermetabolic right pulmonary findings. Incidental CT findings: Coronary atherosclerosis. Atherosclerotic nonaneurysmal thoracic aorta. No significant right pulmonary nodules. ABDOMEN/PELVIS: No abnormal hypermetabolic activity within the liver, pancreas, adrenal glands, or spleen. No hypermetabolic lymph nodes in the abdomen or pelvis. Incidental CT findings: Left nephrectomy. Atherosclerotic nonaneurysmal abdominal aorta. Mild sigmoid diverticulosis. SKELETON: No focal hypermetabolic activity to suggest skeletal metastasis. Incidental CT findings: none IMPRESSION: 1. Suggestion of a hypermetabolic 5.3 cm centrally necrotic left lower lobe lung mass, compatible with primary bronchogenic carcinoma. Suggestion of a hypermetabolic 2.7 cm pulmonary nodule in the lingula, suspicious for metastasis. Complete left lower lobe and lingular atelectasis. 2. Large malignant left pleural effusion with diffuse left pleural thickening and hypermetabolism. 3. No hypermetabolic thoracic adenopathy or extrathoracic metastatic disease. 4. Hypermetabolic small bilateral parotid gland nodules, largest 1.4 cm on the right, most suggestive of Warthin's tumors. 5. Chronic findings include: Aortic Atherosclerosis (ICD10-I70.0). Coronary atherosclerosis. Mild sigmoid diverticulosis. Electronically  Signed   By: Ilona Sorrel M.D.   On: 12/22/2019 13:11     ASSESSMENT: This is a very pleasant 64 years old white female recently diagnosed with stage IV (T3, N0, M1 a) non-small cell lung cancer, adenocarcinoma presented with large left lower lobe lung mass in addition to another lingual mass and malignant left pleural effusion diagnosed in September 2021   PLAN: I had a lengthy discussion with the patient and her husband today about her current disease stage, prognosis and treatment options. I recommended for the patient to send her tissue block for molecular studies and PD-L1 expression by [facility]. I explained to the patient that she has incurable condition and all the treatment will be of palliative nature. I gave her the option of palliative care and hospice referral versus consideration of palliative systemic chemotherapy with carboplatin and Alimta in addition to immunotherapy with Keytruda if she has no actionable mutations but if the patient has an actionable mutation she will be treated with targeted therapy. I recommended for the patient to wait for the molecular studies before proceeding with systemic therapy.  The patient and her husband are willing to wait for the studies first. In the meantime I will refer the patient to IR for consideration of ultrasound-guided left thoracentesis for therapeutic and also diagnostic to keep more tissue for molecular studies if needed. The patient was advised to call immediately if she has any concerning symptoms in the interval. The patient voices understanding of current disease status and treatment options and is in agreement with the current care plan.  All questions were answered. The patient knows to call the clinic with any problems, questions or concerns. We can certainly see the patient much sooner if necessary.  Thank you so much for allowing me to participate in the care of [place]. I will continue to follow up the patient with you and assist in her care.  The total time  spent in the appointment was 70 minutes.  Disclaimer: This note was dictated with voice recognition software. Similar sounding words can inadvertently be transcribed and may not be corrected upon review.   Eilleen Kempf December 28, 2019, 2:11 PM

## 2019-12-28 NOTE — Telephone Encounter (Signed)
Per Dr. Julien Nordmann, I notified path dept to send recent bx [id number] for molecular testing and PDL 1

## 2019-12-30 ENCOUNTER — Encounter: Payer: Self-pay | Admitting: Internal Medicine

## 2019-12-30 DIAGNOSIS — Z7189 Other specified counseling: Secondary | ICD-10-CM | POA: Insufficient documentation

## 2019-12-30 DIAGNOSIS — J90 Pleural effusion, not elsewhere classified: Secondary | ICD-10-CM | POA: Insufficient documentation

## 2019-12-30 DIAGNOSIS — Z5111 Encounter for antineoplastic chemotherapy: Secondary | ICD-10-CM

## 2019-12-30 DIAGNOSIS — C3492 Malignant neoplasm of unspecified part of left bronchus or lung: Secondary | ICD-10-CM | POA: Insufficient documentation

## 2019-12-30 HISTORY — DX: Other specified counseling: Z71.89

## 2019-12-30 HISTORY — DX: Encounter for antineoplastic chemotherapy: Z51.11

## 2020-01-01 ENCOUNTER — Other Ambulatory Visit (HOSPITAL_COMMUNITY)
Admission: RE | Admit: 2020-01-01 | Discharge: 2020-01-01 | Disposition: A | Discharge status: Home / Self Care | Payer: Medicare Other | Source: Ambulatory Visit | Attending: Internal Medicine | Admitting: Internal Medicine

## 2020-01-01 DIAGNOSIS — Z01812 Encounter for preprocedural laboratory examination: Secondary | ICD-10-CM | POA: Diagnosis present

## 2020-01-01 DIAGNOSIS — Z20822 Contact with and (suspected) exposure to covid-19: Secondary | ICD-10-CM | POA: Diagnosis not present

## 2020-01-01 LAB — SARS CORONAVIRUS 2 (TAT 6-24 HRS): SARS Coronavirus 2: NEGATIVE

## 2020-01-02 ENCOUNTER — Encounter: Payer: Self-pay | Admitting: *Deleted

## 2020-01-02 NOTE — Progress Notes (Signed)
I followed up on Gabrielle [name]'s molecular test results. They have been resulted and Dr. Julien Nordmann is updated.  He would like to see her sooner than schedule appt.  I notified scheduling to call and schedule her to be seen on 11/1 with labs and to see Dr. Julien Nordmann.

## 2020-01-04 ENCOUNTER — Ambulatory Visit (HOSPITAL_COMMUNITY)
Admission: RE | Admit: 2020-01-04 | Discharge: 2020-01-04 | Disposition: A | Discharge status: Home / Self Care | Payer: Medicare Other | Source: Ambulatory Visit | Attending: Student | Admitting: Student

## 2020-01-04 ENCOUNTER — Ambulatory Visit (HOSPITAL_COMMUNITY)
Admission: RE | Admit: 2020-01-04 | Discharge: 2020-01-04 | Disposition: A | Discharge status: Home / Self Care | Payer: Medicare Other | Source: Ambulatory Visit | Attending: Internal Medicine | Admitting: Internal Medicine

## 2020-01-04 ENCOUNTER — Other Ambulatory Visit: Payer: Self-pay

## 2020-01-04 ENCOUNTER — Telehealth: Payer: Self-pay | Admitting: Internal Medicine

## 2020-01-04 DIAGNOSIS — J90 Pleural effusion, not elsewhere classified: Secondary | ICD-10-CM | POA: Diagnosis present

## 2020-01-04 DIAGNOSIS — Z9889 Other specified postprocedural states: Secondary | ICD-10-CM

## 2020-01-04 DIAGNOSIS — C3492 Malignant neoplasm of unspecified part of left bronchus or lung: Secondary | ICD-10-CM | POA: Diagnosis not present

## 2020-01-04 MED ORDER — LIDOCAINE HCL 1 % IJ SOLN
INTRAMUSCULAR | Status: AC
  Filled 2020-01-04: qty 20

## 2020-01-04 NOTE — Procedures (Signed)
PROCEDURE SUMMARY:  Successful image-guided left thoracentesis. Yielded 1.1 liters of dark red fluid. Procedure was stopped after 1.1 liters secondary to patient;s symptoms (coughing, chest pain). Patient tolerated procedure well. No immediate complications. EBL < 2 mL.  Specimen was sent for labs. CXR ordered.  Please see imaging section of Epic for full dictation.   Truth Wolaver PA-C 01/04/2020 11:12 AM

## 2020-01-04 NOTE — Telephone Encounter (Signed)
Scheduled appt per 10/26 sch msg - left message for patient with new appt date and time

## 2020-01-08 ENCOUNTER — Other Ambulatory Visit: Payer: Self-pay

## 2020-01-08 ENCOUNTER — Encounter: Payer: Self-pay | Admitting: Internal Medicine

## 2020-01-08 ENCOUNTER — Inpatient Hospital Stay: Payer: Medicare Other | Attending: Internal Medicine

## 2020-01-08 ENCOUNTER — Inpatient Hospital Stay (HOSPITAL_BASED_OUTPATIENT_CLINIC_OR_DEPARTMENT_OTHER): Payer: Medicare Other | Admitting: Internal Medicine

## 2020-01-08 VITALS — BP 129/72 | HR 91 | Temp 98.4°F | Resp 18 | Wt 167.0 lb

## 2020-01-08 DIAGNOSIS — Z23 Encounter for immunization: Secondary | ICD-10-CM | POA: Insufficient documentation

## 2020-01-08 DIAGNOSIS — C3492 Malignant neoplasm of unspecified part of left bronchus or lung: Secondary | ICD-10-CM

## 2020-01-08 DIAGNOSIS — Z5112 Encounter for antineoplastic immunotherapy: Secondary | ICD-10-CM | POA: Insufficient documentation

## 2020-01-08 DIAGNOSIS — Z7984 Long term (current) use of oral hypoglycemic drugs: Secondary | ICD-10-CM | POA: Diagnosis not present

## 2020-01-08 DIAGNOSIS — J910 Malignant pleural effusion: Secondary | ICD-10-CM | POA: Diagnosis not present

## 2020-01-08 DIAGNOSIS — Z7189 Other specified counseling: Secondary | ICD-10-CM | POA: Diagnosis not present

## 2020-01-08 DIAGNOSIS — Z79899 Other long term (current) drug therapy: Secondary | ICD-10-CM | POA: Insufficient documentation

## 2020-01-08 DIAGNOSIS — R5382 Chronic fatigue, unspecified: Secondary | ICD-10-CM

## 2020-01-08 DIAGNOSIS — Z905 Acquired absence of kidney: Secondary | ICD-10-CM | POA: Diagnosis not present

## 2020-01-08 DIAGNOSIS — I10 Essential (primary) hypertension: Secondary | ICD-10-CM | POA: Diagnosis not present

## 2020-01-08 DIAGNOSIS — C3432 Malignant neoplasm of lower lobe, left bronchus or lung: Secondary | ICD-10-CM | POA: Diagnosis present

## 2020-01-08 DIAGNOSIS — E785 Hyperlipidemia, unspecified: Secondary | ICD-10-CM | POA: Diagnosis not present

## 2020-01-08 HISTORY — DX: Encounter for antineoplastic immunotherapy: Z51.12

## 2020-01-08 LAB — CBC WITH DIFFERENTIAL (CANCER CENTER ONLY)
Abs Immature Granulocytes: 0.01 10*3/uL (ref 0.00–0.07)
Basophils Absolute: 0.1 10*3/uL (ref 0.0–0.1)
Basophils Relative: 1 %
Eosinophils Absolute: 0.2 10*3/uL (ref 0.0–0.5)
Eosinophils Relative: 4 %
HCT: 41.3 % (ref 36.0–46.0)
Hemoglobin: 13.4 g/dL (ref 12.0–15.0)
Immature Granulocytes: 0 %
Lymphocytes Relative: 21 %
Lymphs Abs: 1.1 10*3/uL (ref 0.7–4.0)
MCH: 30.2 pg (ref 26.0–34.0)
MCHC: 32.4 g/dL (ref 30.0–36.0)
MCV: 93 fL (ref 80.0–100.0)
Monocytes Absolute: 0.3 10*3/uL (ref 0.1–1.0)
Monocytes Relative: 6 %
Neutro Abs: 3.6 10*3/uL (ref 1.7–7.7)
Neutrophils Relative %: 68 %
Platelet Count: 227 10*3/uL (ref 150–400)
RBC: 4.44 MIL/uL (ref 3.87–5.11)
RDW: 13.3 % (ref 11.5–15.5)
WBC Count: 5.3 10*3/uL (ref 4.0–10.5)
nRBC: 0 % (ref 0.0–0.2)

## 2020-01-08 LAB — CMP (CANCER CENTER ONLY)
ALT: 13 U/L (ref 0–44)
AST: 13 U/L — ABNORMAL LOW (ref 15–41)
Albumin: 3.8 g/dL (ref 3.5–5.0)
Alkaline Phosphatase: 53 U/L (ref 38–126)
Anion gap: 6 (ref 5–15)
BUN: 24 mg/dL — ABNORMAL HIGH (ref 8–23)
CO2: 31 mmol/L (ref 22–32)
Calcium: 9.9 mg/dL (ref 8.9–10.3)
Chloride: 103 mmol/L (ref 98–111)
Creatinine: 1.17 mg/dL — ABNORMAL HIGH (ref 0.44–1.00)
GFR, Estimated: 52 mL/min — ABNORMAL LOW (ref >60)
Glucose, Bld: 187 mg/dL — ABNORMAL HIGH (ref 70–99)
Potassium: 4.3 mmol/L (ref 3.5–5.1)
Sodium: 140 mmol/L (ref 135–145)
Total Bilirubin: 0.4 mg/dL (ref 0.3–1.2)
Total Protein: 7.5 g/dL (ref 6.5–8.1)

## 2020-01-08 LAB — CYTOLOGY - NON PAP

## 2020-01-08 MED ORDER — PROCHLORPERAZINE MALEATE 10 MG PO TABS: 10.0000 mg | ORAL_TABLET | Freq: Four times a day (QID) | ORAL | 0 refills | Status: DC | PRN

## 2020-01-08 NOTE — Progress Notes (Signed)
START OFF PATHWAY REGIMEN - Non-Small Cell Lung   [id number]:Cemiplimab 350 mg IV D1 q21 Days:   A cycle is every 21 days:     Cemiplimab-rwlc   **Always confirm dose/schedule in your pharmacy ordering system**  Patient Characteristics: Stage IV Metastatic, Nonsquamous, Initial Chemotherapy/Immunotherapy, PS = 0, 1, ALK Rearrangement Negative and ROS1 Rearrangement Negative and NTRK Gene Fusion?Negative and RET Gene Fusion?Negative and EGFR Mutation Negative, PD-L1 Expression Positive   ? 50% (TPS) and Immunotherapy Candidate Therapeutic Status: Stage IV Metastatic Histology: Nonsquamous Cell ROS1 Rearrangement Status: Negative Other Mutations/Biomarkers: No Other Actionable Mutations Chemotherapy/Immunotherapy LOT: Initial Chemotherapy/Immunotherapy Molecular Targeted Therapy: Not Appropriate KRAS G12C Mutation Status: G12C Positive MET Exon 14 Mutation Status: Negative RET Gene Fusion Status: Negative EGFR Mutation Status: Negative/Wild Type NTRK Gene Fusion Status: Negative PD-L1 Expression Status: PD-L1 Positive ? 50% (TPS) ALK Rearrangement Status: Negative BRAF [id number] Mutation Status: Negative ECOG Performance Status: 1 Biomarker Assessment Status Confirmation: All Genomic Markers Negative, or Only MET+ or BRAF+ or KRAS G12C+ Immunotherapy Candidate Status: Candidate for Immunotherapy Intent of Therapy: Non-Curative / Palliative Intent, Discussed with Patient

## 2020-01-08 NOTE — Progress Notes (Signed)
[place] [phone]   [phone]  OFFICE PROGRESS NOTE  Gabrielle Dandy, NP [place] [address]  DIAGNOSIS: stage IV (T3, N0, M1 a) non-small cell lung cancer, adenocarcinoma presented with large left lower lobe lung mass in addition to another lingual mass and malignant left pleural effusion diagnosed in September 2021  Biomarker Findings Tumor Mutational Burden - 13 Muts/Mb Microsatellite status - MS-Stable Genomic Findings For a complete list of the genes assayed, please refer to the Appendix. KRAS G12C PRDM1 D228f*46 TERT promoter -146C>T TP53 V157F 7 Disease relevant genes with no reportable alterations: ALK, BRAF, EGFR, ERBB2, MET, RET, ROS1   PDL1 Expression: 95%  PRIOR THERAPY: None.  CURRENT THERAPY: Libtayo (Cempilimab) 350 mg IV every 3 weeks.  First dose January 15, 2020.  [name] 64y.o. female returns to the clinic today for follow-up visit accompanied by her husband.  The patient is feeling fine today with no concerning complaints.  She underwent ultrasound-guided left thoracentesis with drainage of 1100 mL of pleural fluid.  The cytology is still pending.  The patient had molecular studies by [facility] reported recently.  It showed K-ras G12C mutation.  She also has PD-L1 expression of 95%.  The patient denied having any current chest pain, shortness of breath, cough or hemoptysis.  She denied having any fever or chills.  She has no nausea, vomiting, diarrhea or constipation.  She has no headache or visual changes.  She denied having any weight loss or night sweats.  She is here today for evaluation and discussion of her treatment options based on the molecular studies.  MEDICAL HISTORY: Past Medical History:  Diagnosis Date  . Hyperlipidemia   . Hypertension   . Pleural effusion   . Pneumonia     ALLERGIES:  is allergic to ace inhibitors.  MEDICATIONS:    Current Outpatient Medications  Medication Sig Dispense Refill  . cholecalciferol (VITAMIN D3) 25 MCG (1000 UNIT) tablet Take 2,000 Units by mouth daily.    .Marland Kitchenlosartan-hydrochlorothiazide (HYZAAR) 100-12.5 MG tablet Take 1 tablet by mouth daily.    . metFORMIN (GLUCOPHAGE) 500 MG tablet Take 500 mg by mouth 2 (two) times daily with a meal.    . pravastatin (PRAVACHOL) 40 MG tablet Take 40 mg by mouth daily.    . sertraline (ZOLOFT) 50 MG tablet Take 75 mg by mouth daily.     No current facility-administered medications for this visit.    SURGICAL HISTORY:  Past Surgical History:  Procedure Laterality Date  . NEPHRECTOMY Left     REVIEW OF SYSTEMS:  Constitutional: positive for fatigue Eyes: negative Ears, nose, mouth, throat, and face: negative Respiratory: positive for dyspnea on exertion Cardiovascular: negative Gastrointestinal: negative Genitourinary:negative Integument/breast: negative Hematologic/lymphatic: negative Musculoskeletal:negative Neurological: negative Behavioral/Psych: negative Endocrine: negative Allergic/Immunologic: negative   PHYSICAL EXAMINATION: General appearance: alert, cooperative, fatigued and no distress Head: Normocephalic, without obvious abnormality, atraumatic Neck: no adenopathy, no JVD, supple, symmetrical, trachea midline and thyroid not enlarged, symmetric, no tenderness/mass/nodules Lymph nodes: Cervical, supraclavicular, and axillary nodes normal. Resp: clear to auscultation bilaterally Back: symmetric, no curvature. ROM normal. No CVA tenderness. Cardio: regular rate and rhythm, S1, S2 normal, no murmur, click, rub or gallop GI: soft, non-tender; bowel sounds normal; no masses,  no organomegaly Extremities: extremities normal, atraumatic, no cyanosis or edema Neurologic: Alert and oriented X 3, normal strength and tone. Normal symmetric reflexes. Normal coordination and  gait  ECOG PERFORMANCE STATUS: 1 - Symptomatic but completely  ambulatory  Blood pressure 129/72, pulse 91, temperature 98.4 F (36.9 C), temperature source Tympanic, resp. rate 18, weight 167 lb (75.8 kg), SpO2 97 %.  LABORATORY DATA: Lab Results  Component Value Date   WBC 5.3 01/08/2020   HGB 13.4 01/08/2020   HCT 41.3 01/08/2020   MCV 93.0 01/08/2020   PLT 227 01/08/2020      Chemistry      Component Value Date/Time   NA 140 01/08/2020 1502   K 4.3 01/08/2020 1502   CL 103 01/08/2020 1502   CO2 31 01/08/2020 1502   BUN 24 (H) 01/08/2020 1502   CREATININE 1.17 (H) 01/08/2020 1502      Component Value Date/Time   CALCIUM 9.9 01/08/2020 1502   ALKPHOS 53 01/08/2020 1502   AST 13 (L) 01/08/2020 1502   ALT 13 01/08/2020 1502   BILITOT 0.4 01/08/2020 1502       RADIOGRAPHIC STUDIES: DG Chest 1 View  Result Date: 01/04/2020 CLINICAL DATA:  Status post thoracentesis. EXAM: CHEST  1 VIEW COMPARISON:  PET-CT 12/22/2019 FINDINGS: Large left pleural effusion is again noted. This appears mildly decreased in volume when compared with the previous exam. No pneumothorax identified status post thoracentesis. Right lung is clear. IMPRESSION: 1. No pneumothorax status post left thoracentesis. 2. Large left pleural effusion, mildly decreased from previous exam. Electronically Signed   By: Kerby Moors M.D.   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 Electronically Signed   By: Kellie Simmering DO   On: 12/26/2019 15:04   Result Date: 12/26/2019 CLINICAL DATA:  Adenocarcinoma of left lung. Metastatic disease evaluation. EXAM: MRI HEAD WITHOUT AND WITH CONTRAST TECHNIQUE: Multiplanar, multiecho pulse sequences of the brain and surrounding structures were obtained without and with intravenous contrast.  CONTRAST:  53m GADAVIST GADOBUTROL 1 MMOL/ML IV SOLN COMPARISON:  PET-CT 12/22/2019. FINDINGS: Brain: Mild intermittent motion degradation. Cerebral volume is normal for age. There is a 1.4 x 1.3 x 1.1 cm dural-based mass overlying the left frontal calvarium demonstrating relatively homogeneous enhancement as well as a dural tail (series 16, image 82) (series 17, image 17). Mild mass effect upon the underlying left frontal lobe. Additionally, there is mild vasogenic edema within the underlying left frontal lobe. No abnormal intracranial enhancement is identified elsewhere to suggest intracranial metastatic disease. A subcentimeter curvilinear focus of enhancement within the right basal ganglia appears vascular. Additional mild multifocal T2/FLAIR hyperintensity within the cerebral white matter and pons is nonspecific, but compatible with chronic small vessel ischemic disease. Small chronic left thalamic lacunar infarct. There is no acute infarct. No chronic intracranial blood products. No extra-axial fluid collection. No midline shift. Vascular: Expected proximal arterial flow voids. Skull and upper cervical spine: 4 mm T1 hypointense and T2/FLAIR hyperintense enhancing focus within the right frontal calvarium (series 16, image 108). No focal marrow lesion identified elsewhere. Sinuses/Orbits: Prior right lens replacement. Visualized orbits show no acute finding. No significant paranasal sinus disease or mastoid effusion. These results will be called to the ordering clinician or representative by the Radiologist Assistant, and communication documented in the PACS or [employer] IMPRESSION: 1.4 x 1.3 cm enhancing dural-based mass overlying the left frontal operculum. While this may reflect  a meningioma, a dural-based metastatic lesion cannot be excluded. Mild mass effect upon the underlying left frontal lobe with mild parenchymal edema. 4 mm indeterminate enhancing lesion within the right frontal calvarium for  which a small osseous metastasis cannot be excluded. Attention recommended on follow-up. Mild chronic small vessel ischemic disease. This includes a small chronic left thalamic lacunar infarct. Electronically Signed: By: Kellie Simmering DO On: 12/26/2019 14:59   NM PET Image Initial (PI) Skull Base To Thigh  Result Date: 12/22/2019 CLINICAL DATA:  Initial treatment strategy for non-small cell left lung cancer. EXAM: NUCLEAR MEDICINE PET SKULL BASE TO THIGH TECHNIQUE: 8.2 mCi F-18 FDG was injected intravenously. Full-ring PET imaging was performed from the skull base to thigh after the radiotracer. CT data was obtained and used for attenuation correction and anatomic localization. Fasting blood glucose: 118 mg/dl COMPARISON:  None. FINDINGS: Mediastinal blood pool activity: SUV max 2.9 Liver activity: SUV max NA NECK: There are small hypermetabolic soft tissue nodules in the parotid glands bilaterally, largest 1.4 x 0.9 cm on the right with max SUV 8.5 (series 4/image 21) and 0.9 x 0.6 cm on the left with max SUV 4.0 (series 4/image 23). No enlarged or hypermetabolic lymph nodes in the neck. Incidental CT findings: none CHEST: Large left pleural effusion with diffuse left pleural thickening and hypermetabolism. Representative focus of hypermetabolism in the medial basilar left pleural space with max SUV 11.1. Suggestion of a hypermetabolic 5.3 x 5.1 cm centrally necrotic left lower lobe lung mass with max SUV 12.3 (series 4/image 86). Complete left lower lobe atelectasis. Suggestion of a separate 2.7 cm hypermetabolic pulmonary nodule in the lingula with max SUV 13.4 (series 4/image 76). Complete lingular atelectasis. No enlarged or hypermetabolic axillary, mediastinal or hilar lymph nodes. No hypermetabolic right pulmonary findings. Incidental CT findings: Coronary atherosclerosis. Atherosclerotic nonaneurysmal thoracic aorta. No significant right pulmonary nodules. ABDOMEN/PELVIS: No abnormal hypermetabolic  activity within the liver, pancreas, adrenal glands, or spleen. No hypermetabolic lymph nodes in the abdomen or pelvis. Incidental CT findings: Left nephrectomy. Atherosclerotic nonaneurysmal abdominal aorta. Mild sigmoid diverticulosis. SKELETON: No focal hypermetabolic activity to suggest skeletal metastasis. Incidental CT findings: none IMPRESSION: 1. Suggestion of a hypermetabolic 5.3 cm centrally necrotic left lower lobe lung mass, compatible with primary bronchogenic carcinoma. Suggestion of a hypermetabolic 2.7 cm pulmonary nodule in the lingula, suspicious for metastasis. Complete left lower lobe and lingular atelectasis. 2. Large malignant left pleural effusion with diffuse left pleural thickening and hypermetabolism. 3. No hypermetabolic thoracic adenopathy or extrathoracic metastatic disease. 4. Hypermetabolic small bilateral parotid gland nodules, largest 1.4 cm on the right, most suggestive of Warthin's tumors. 5. Chronic findings include: Aortic Atherosclerosis (ICD10-I70.0). Coronary atherosclerosis. Mild sigmoid diverticulosis. Electronically Signed   By: Ilona Sorrel M.D.   On: 12/22/2019 13:11   US Thoracentesis Asp Pleural space w/IMG guide  Result Date: 01/04/2020 INDICATION: Patient with history of lung cancer, dyspnea, and recurrent left pleural effusion. Request is made for diagnostic and therapeutic left thoracentesis. EXAM: ULTRASOUND GUIDED DIAGNOSTIC AND THERAPEUTIC LEFT THORACENTESIS MEDICATIONS: 10 mL 1% lidocaine COMPLICATIONS: None immediate. PROCEDURE: An ultrasound guided thoracentesis was thoroughly discussed with the patient and questions answered. The benefits, risks, alternatives and complications were also discussed. The patient understands and wishes to proceed with the procedure. Written consent was obtained. Ultrasound was performed to localize and mark an adequate pocket of fluid in the left chest. The area was then prepped and draped in the normal sterile fashion. 1%  Lidocaine was used for local  anesthesia. Under ultrasound guidance a 6 Fr Safe-T-Centesis catheter was introduced. Thoracentesis was performed. The catheter was removed and a dressing applied. FINDINGS: A total of approximately 1.1 L of dark red fluid was removed. Samples were sent to the laboratory as requested by the clinical team. IMPRESSION: Successful ultrasound guided left thoracentesis yielding 1.1 L of pleural fluid. Read by: Earley Abide, PA-C Electronically Signed   By: Jacqulynn Cadet M.D.   On: 01/04/2020 12:41    ASSESSMENT AND PLAN: This is a very pleasant 64 years old white female recently diagnosed with a stage IV (T3, N0, M1 a) non-small cell lung cancer, adenocarcinoma presented with large left lower lobe lung mass in addition to another lingular mass and malignant left pleural effusion diagnosed in September 2021.  The molecular studies showed positive PD-L1 expression of 95%.  The patient also has K-ras G12C mutation which will be an option for the second line treatment. I had a lengthy discussion with the patient today about her current disease stage, prognosis and treatment options. With the high PD-L1 expression I discussed with the patient treatment with single agent Libtayo (Cempilimab) 350 mg IV every 3 weeks for up to 2 years unless the patient has evidence for disease progression or unacceptable toxicity. I discussed with the patient the adverse effect of this treatment including but not limited to immunotherapy mediated skin rash, diarrhea, inflammation of the lung, kidney, liver, thyroid or other endocrine dysfunction. The patient is interested in this treatment. She will have a chemotherapy education class before the first dose of her treatment. The patient is expected to start the first cycle of her treatment next week. She will come back for follow-up visit in 2 weeks for evaluation and management of any adverse effect of her treatment. She was advised to call  immediately if she has any other concerning symptoms in the interval. The patient voices understanding of current disease status and treatment options and is in agreement with the current care plan.  All questions were answered. The patient knows to call the clinic with any problems, questions or concerns. We can certainly see the patient much sooner if necessary.  The total time spent in the appointment was 40 minutes.  Disclaimer: This note was dictated with voice recognition software. Similar sounding words can inadvertently be transcribed and may not be corrected upon review.

## 2020-01-11 ENCOUNTER — Telehealth: Payer: Self-pay | Admitting: Internal Medicine

## 2020-01-11 NOTE — Telephone Encounter (Signed)
Scheduled per los. Called and spoke with patient. Confirmed appts  

## 2020-01-15 ENCOUNTER — Encounter: Payer: Self-pay | Admitting: Internal Medicine

## 2020-01-15 ENCOUNTER — Inpatient Hospital Stay: Payer: Medicare Other

## 2020-01-15 ENCOUNTER — Other Ambulatory Visit: Payer: Self-pay

## 2020-01-15 NOTE — Progress Notes (Signed)
Called pt to introduce myself as her Arboriculturist and to discuss the [employer].  Pt has 2 insurances so copay assistance shouldn't be needed.  I left a msg requesting she return my call if she's interested in applying for the [employer].

## 2020-01-15 NOTE — Progress Notes (Signed)
Pharmacist Chemotherapy Monitoring - Initial Assessment    Anticipated start date: 01/16/20   Regimen:  . Are orders appropriate based on the patient's diagnosis, regimen, and cycle? Yes . Does the plan date match the patient's scheduled date? Yes . Is the sequencing of drugs appropriate? Yes . Are the premedications appropriate for the patient's regimen? Yes . Prior Authorization for treatment is: Approved o If applicable, is the correct biosimilar selected based on the patient's insurance? not applicable  Organ Function and Labs: Marland Kitchen Are dose adjustments needed based on the patient's renal function, hepatic function, or hematologic function? Yes . Are appropriate labs ordered prior to the start of patient's treatment? Yes . Other organ system assessment, if indicated: N/A . The following baseline labs, if indicated, have been ordered: cemiplimab: baseline TSH +/- T4  Dose Assessment: . Are the drug doses appropriate? Yes . Are the following correct: o Drug concentrations Yes o IV fluid compatible with drug Yes o Administration routes Yes o Timing of therapy Yes . If applicable, does the patient have documented access for treatment and/or plans for port-a-cath placement? not applicable . If applicable, have lifetime cumulative doses been properly documented and assessed? not applicable Lifetime Dose Tracking  No doses have been documented on this patient for the following tracked chemicals: Doxorubicin, Epirubicin, Idarubicin, Daunorubicin, Mitoxantrone, Bleomycin, Oxaliplatin, Carboplatin, Liposomal Doxorubicin  o   Toxicity Monitoring/Prevention: . The patient has the following take home antiemetics prescribed: Prochlorperazine . The patient has the following take home medications prescribed: N/A . Medication allergies and previous infusion related reactions, if applicable, have been reviewed and addressed. Yes . The patient's current medication list has been assessed for drug-drug  interactions with their chemotherapy regimen. no significant drug-drug interactions were identified on review.  Order Review: . Are the treatment plan orders signed? Yes . Is the patient scheduled to see a provider prior to their treatment? No  I verify that I have reviewed each item in the above checklist and answered each question accordingly.  Philomena Course 01/15/2020 12:55 PM

## 2020-01-16 ENCOUNTER — Inpatient Hospital Stay: Payer: Medicare Other

## 2020-01-16 ENCOUNTER — Other Ambulatory Visit: Payer: Self-pay

## 2020-01-16 ENCOUNTER — Other Ambulatory Visit: Payer: Medicare Other

## 2020-01-16 VITALS — BP 124/74 | HR 67 | Temp 97.5°F | Resp 20 | Wt 167.2 lb

## 2020-01-16 DIAGNOSIS — Z5112 Encounter for antineoplastic immunotherapy: Secondary | ICD-10-CM | POA: Diagnosis not present

## 2020-01-16 DIAGNOSIS — C3492 Malignant neoplasm of unspecified part of left bronchus or lung: Secondary | ICD-10-CM

## 2020-01-16 DIAGNOSIS — R5382 Chronic fatigue, unspecified: Secondary | ICD-10-CM

## 2020-01-16 LAB — CBC WITH DIFFERENTIAL (CANCER CENTER ONLY)
Abs Immature Granulocytes: 0.01 10*3/uL (ref 0.00–0.07)
Basophils Absolute: 0.1 10*3/uL (ref 0.0–0.1)
Basophils Relative: 1 %
Eosinophils Absolute: 0.2 10*3/uL (ref 0.0–0.5)
Eosinophils Relative: 4 %
HCT: 40.6 % (ref 36.0–46.0)
Hemoglobin: 13.2 g/dL (ref 12.0–15.0)
Immature Granulocytes: 0 %
Lymphocytes Relative: 21 %
Lymphs Abs: 1.1 10*3/uL (ref 0.7–4.0)
MCH: 29.7 pg (ref 26.0–34.0)
MCHC: 32.5 g/dL (ref 30.0–36.0)
MCV: 91.2 fL (ref 80.0–100.0)
Monocytes Absolute: 0.4 10*3/uL (ref 0.1–1.0)
Monocytes Relative: 7 %
Neutro Abs: 3.3 10*3/uL (ref 1.7–7.7)
Neutrophils Relative %: 67 %
Platelet Count: 241 10*3/uL (ref 150–400)
RBC: 4.45 MIL/uL (ref 3.87–5.11)
RDW: 13 % (ref 11.5–15.5)
WBC Count: 5 10*3/uL (ref 4.0–10.5)
nRBC: 0 % (ref 0.0–0.2)

## 2020-01-16 LAB — CMP (CANCER CENTER ONLY)
ALT: 9 U/L (ref 0–44)
AST: 12 U/L — ABNORMAL LOW (ref 15–41)
Albumin: 3.9 g/dL (ref 3.5–5.0)
Alkaline Phosphatase: 50 U/L (ref 38–126)
Anion gap: 9 (ref 5–15)
BUN: 24 mg/dL — ABNORMAL HIGH (ref 8–23)
CO2: 27 mmol/L (ref 22–32)
Calcium: 9.9 mg/dL (ref 8.9–10.3)
Chloride: 103 mmol/L (ref 98–111)
Creatinine: 0.98 mg/dL (ref 0.44–1.00)
GFR, Estimated: >60 mL/min (ref >60)
Glucose, Bld: 105 mg/dL — ABNORMAL HIGH (ref 70–99)
Potassium: 4.1 mmol/L (ref 3.5–5.1)
Sodium: 139 mmol/L (ref 135–145)
Total Bilirubin: 0.4 mg/dL (ref 0.3–1.2)
Total Protein: 7.6 g/dL (ref 6.5–8.1)

## 2020-01-16 LAB — TSH: TSH: 1.576 u[IU]/mL (ref 0.308–3.960)

## 2020-01-16 MED ORDER — SODIUM CHLORIDE 0.9 % IV SOLN
Freq: Once | INTRAVENOUS | Status: AC
  Filled 2020-01-16: qty 250

## 2020-01-16 MED ORDER — SODIUM CHLORIDE 0.9 % IV SOLN
350.0000 mg | Freq: Once | INTRAVENOUS | Status: AC
  Administered 2020-01-16: 350 mg via INTRAVENOUS
  Filled 2020-01-16: qty 7

## 2020-01-16 NOTE — Patient Instructions (Signed)
[facility] Discharge Instructions for Patients Receiving Chemotherapy  Today you received the following immunotherapy agent: Cemiplimab (Lintayo)  To help prevent nausea and vomiting after your treatment, we encourage you to take your nausea medication as directed by your MD.   If you develop nausea and vomiting that is not controlled by your nausea medication, call the clinic.   BELOW ARE SYMPTOMS THAT SHOULD BE REPORTED IMMEDIATELY:  *FEVER GREATER THAN 100.5 F  *CHILLS WITH OR WITHOUT FEVER  NAUSEA AND VOMITING THAT IS NOT CONTROLLED WITH YOUR NAUSEA MEDICATION  *UNUSUAL SHORTNESS OF BREATH  *UNUSUAL BRUISING OR BLEEDING  TENDERNESS IN MOUTH AND THROAT WITH OR WITHOUT PRESENCE OF ULCERS  *URINARY PROBLEMS  *BOWEL PROBLEMS  UNUSUAL RASH Items with * indicate a potential emergency and should be followed up as soon as possible.  Feel free to call the clinic should you have any questions or concerns. The clinic phone number is [phone].  Please show the [facility] at check-in to the Emergency Department and triage nurse.  Cemiplimab injection What is this medicine? CEMIPLIMAB (se mip li mab) is a monoclonal antibody. It is used to treat cutaneous squamous cell carcinoma. This medicine may be used for other purposes; ask your health care provider or pharmacist if you have questions. COMMON BRAND NAME(S): LIBTAYO What should I tell my health care provider before I take this medicine? They need to know if you have any of these conditions:  diabetes  immune system problems like lupus  inflammatory bowel disease  kidney disease  liver disease  lung or breathing disease  organ transplant  an unusual or allergic reaction to cemiplimab, other medicines, foods, dyes, or preservatives  pregnant or trying to get pregnant  breast-feeding How should I use this medicine? This medicine is for infusion into a vein. It is given by a health care  professional in a hospital or clinic setting. A special MedGuide will be given to you before each treatment. Be sure to read this information carefully each time. Talk to your pediatrician regarding the use of this medicine in children. Special care may be needed. Overdosage: If you think you have taken too much of this medicine contact a poison control center or emergency room at once. NOTE: This medicine is only for you. Do not share this medicine with others. What if I miss a dose? It is important not to miss your dose. Call your doctor or health care professional if you are unable to keep an appointment. What may interact with this medicine? Interactions have not been studied. Give your health care provider a list of all the medicines, herbs, non-prescription drugs, or dietary supplements you use. Also tell them if you smoke, drink alcohol, or use illegal drugs. Some items may interact with your medicine. This list may not describe all possible interactions. Give your health care provider a list of all the medicines, herbs, non-prescription drugs, or dietary supplements you use. Also tell them if you smoke, drink alcohol, or use illegal drugs. Some items may interact with your medicine. What should I watch for while using this medicine? Your condition will be monitored carefully while you are receiving this medicine. You may need blood work done while you are taking this medicine. Do not become pregnant while taking this medicine or for at least 4 months after stopping it. Women should inform their doctor if they wish to become pregnant or think they might be pregnant. There is a potential for serious  side effects to an unborn child. Talk to your health care professional or pharmacist for more information. Do not breast-feed an infant while taking this medicine or for at least 4 months after the last dose. What side effects may I notice from receiving this medicine? Side effects that you should  report to your doctor or health care professional as soon as possible:  allergic reactions like skin rash, itching or hives; swelling of the face, lips, or tongue  black, tarry stools  bloody or watery diarrhea  breathing problems  changes in vision  changes in voice  chest pain or chest tightness  chills  cough  dizziness  fast or irregular heart beat  feeling faint or lightheaded  hair loss  increased hunger or thirst  muscle weakness  persistent headache  redness, blistering, peeling or loosening of the skin, including inside the mouth  signs and symptoms of kidney injury like trouble passing urine or change in the amount of urine  signs and symptoms of liver injury like dark yellow or brown urine; general ill feeling or flu-like symptoms; light-colored stools; loss of appetite; nausea; right upper belly pain; unusually weak or tired; yellowing of the eyes or skin  stomach pain  unusual bleeding or bruising  weight gain or weight loss  unusual sweating Side effects that usually do not require medical attention (report these to your doctor or health care professional if they continue or are bothersome):  bone pain  constipation  muscle pain  tiredness This list may not describe all possible side effects. Call your doctor for medical advice about side effects. You may report side effects to FDA at 1-800-FDA-1088. Where should I keep my medicine? This drug is given in a hospital or clinic and will not be stored at home. NOTE: This sheet is a summary. It may not cover all possible information. If you have questions about this medicine, talk to your doctor, pharmacist, or health care provider.  2020 Elsevier/Gold Standard (2018-09-13 14:26:31)

## 2020-01-17 ENCOUNTER — Telehealth: Payer: Self-pay | Admitting: *Deleted

## 2020-01-17 ENCOUNTER — Other Ambulatory Visit: Payer: Medicare Other

## 2020-01-17 ENCOUNTER — Ambulatory Visit: Payer: Medicare Other | Admitting: Internal Medicine

## 2020-01-19 ENCOUNTER — Encounter: Payer: Self-pay | Admitting: Internal Medicine

## 2020-01-19 NOTE — Progress Notes (Signed)
Pt returned my call and would like to apply for the [place] so she will bring proof of income on 01/23/20.  If approved I will give her an expense sheet and my card for any questions or concerns she may have in the future.

## 2020-01-23 ENCOUNTER — Encounter: Payer: Self-pay | Admitting: Internal Medicine

## 2020-01-23 ENCOUNTER — Inpatient Hospital Stay (HOSPITAL_BASED_OUTPATIENT_CLINIC_OR_DEPARTMENT_OTHER): Payer: Medicare Other | Admitting: Internal Medicine

## 2020-01-23 ENCOUNTER — Other Ambulatory Visit: Payer: Self-pay

## 2020-01-23 VITALS — BP 128/73 | HR 71 | Temp 96.8°F | Resp 19 | Ht 64.5 in | Wt 168.9 lb

## 2020-01-23 DIAGNOSIS — Z23 Encounter for immunization: Secondary | ICD-10-CM | POA: Diagnosis not present

## 2020-01-23 DIAGNOSIS — C3492 Malignant neoplasm of unspecified part of left bronchus or lung: Secondary | ICD-10-CM | POA: Diagnosis not present

## 2020-01-23 DIAGNOSIS — Z5112 Encounter for antineoplastic immunotherapy: Secondary | ICD-10-CM

## 2020-01-23 MED ORDER — INFLUENZA VAC A&B SA ADJ QUAD 0.5 ML IM PRSY
0.5000 mL | PREFILLED_SYRINGE | Freq: Once | INTRAMUSCULAR | Status: AC
  Administered 2020-01-23: 0.5 mL via INTRAMUSCULAR

## 2020-01-23 MED ORDER — INFLUENZA VAC A&B SA ADJ QUAD 0.5 ML IM PRSY
PREFILLED_SYRINGE | INTRAMUSCULAR | Status: AC
  Filled 2020-01-23: qty 0.5

## 2020-01-23 NOTE — Progress Notes (Signed)
[place] [phone]   [phone]  OFFICE PROGRESS NOTE  Gabrielle Dandy, NP [place] [address]  DIAGNOSIS: stage IV (T3, N0, M1 a) non-small cell lung cancer, adenocarcinoma presented with large left lower lobe lung mass in addition to another lingual mass and malignant left pleural effusion diagnosed in September 2021  Biomarker Findings Tumor Mutational Burden - 13 Muts/Mb Microsatellite status - MS-Stable Genomic Findings For a complete list of the genes assayed, please refer to the Appendix. KRAS G12C PRDM1 D244f*46 TERT promoter -146C>T TP53 V157F 7 Disease relevant genes with no reportable alterations: ALK, BRAF, EGFR, ERBB2, MET, RET, ROS1   PDL1 Expression: 95%  PRIOR THERAPY: None.  CURRENT THERAPY: Libtayo (Cempilimab) 350 mg IV every 3 weeks.  First dose January 15, 2020.  Status post 1 cycle.  INTERVAL HISTORY: Ammy H Menchaca 64y.o. female returns to the clinic today for follow-up visit accompanied by her husband.  The patient started the first cycle of treatment with Libtayo (Cempilimab) last week and tolerated the first week of her treatment fairly well with no concerning adverse effects.  She denied having any current chest pain, shortness of breath, cough or hemoptysis.  She denied having any fever or chills.  She has no nausea, vomiting, diarrhea or constipation.  She denied having any headache or visual changes.  The patient is here today for evaluation and repeat blood work.  MEDICAL HISTORY: Past Medical History:  Diagnosis Date  . Hyperlipidemia   . Hypertension   . Pleural effusion   . Pneumonia     ALLERGIES:  is allergic to ace inhibitors.  MEDICATIONS:  Current Outpatient Medications  Medication Sig Dispense Refill  . cholecalciferol (VITAMIN D3) 25 MCG (1000 UNIT) tablet Take 2,000 Units by mouth daily.    .Marland Kitchenlosartan-hydrochlorothiazide (HYZAAR) 100-12.5 MG tablet Take 1  tablet by mouth daily.    . metFORMIN (GLUCOPHAGE) 500 MG tablet Take 500 mg by mouth 2 (two) times daily with a meal.    . pravastatin (PRAVACHOL) 40 MG tablet Take 40 mg by mouth daily.    . prochlorperazine (COMPAZINE) 10 MG tablet Take 1 tablet (10 mg total) by mouth every 6 (six) hours as needed for nausea or vomiting. 30 tablet 0  . sertraline (ZOLOFT) 50 MG tablet Take 75 mg by mouth daily.     No current facility-administered medications for this visit.    SURGICAL HISTORY:  Past Surgical History:  Procedure Laterality Date  . NEPHRECTOMY Left     REVIEW OF SYSTEMS:  A comprehensive review of systems was negative.   PHYSICAL EXAMINATION: General appearance: alert, cooperative and no distress Head: Normocephalic, without obvious abnormality, atraumatic Neck: no adenopathy, no JVD, supple, symmetrical, trachea midline and thyroid not enlarged, symmetric, no tenderness/mass/nodules Lymph nodes: Cervical, supraclavicular, and axillary nodes normal. Resp: clear to auscultation bilaterally Back: symmetric, no curvature. ROM normal. No CVA tenderness. Cardio: regular rate and rhythm, S1, S2 normal, no murmur, click, rub or gallop GI: soft, non-tender; bowel sounds normal; no masses,  no organomegaly Extremities: extremities normal, atraumatic, no cyanosis or edema  ECOG PERFORMANCE STATUS: 1 - Symptomatic but completely ambulatory  Blood pressure 128/73, pulse 71, temperature (!) 96.8 F (36 C), temperature source Tympanic, resp. rate 19, height 5' 4.5" (1.638 m), weight 168 lb 14.4 oz (76.6 kg), SpO2 97 %.  LABORATORY DATA: Lab Results  Component Value Date   WBC 5.0 01/16/2020  HGB 13.2 01/16/2020   HCT 40.6 01/16/2020   MCV 91.2 01/16/2020   PLT 241 01/16/2020      Chemistry      Component Value Date/Time   NA 139 01/16/2020 1222   K 4.1 01/16/2020 1222   CL 103 01/16/2020 1222   CO2 27 01/16/2020 1222   BUN 24 (H) 01/16/2020 1222   CREATININE 0.98 01/16/2020  1222      Component Value Date/Time   CALCIUM 9.9 01/16/2020 1222   ALKPHOS 50 01/16/2020 1222   AST 12 (L) 01/16/2020 1222   ALT 9 01/16/2020 1222   BILITOT 0.4 01/16/2020 1222       RADIOGRAPHIC STUDIES: DG Chest 1 View  Result Date: 01/04/2020 CLINICAL DATA:  Status post thoracentesis. EXAM: CHEST  1 VIEW COMPARISON:  PET-CT 12/22/2019 FINDINGS: Large left pleural effusion is again noted. This appears mildly decreased in volume when compared with the previous exam. No pneumothorax identified status post thoracentesis. Right lung is clear. IMPRESSION: 1. No pneumothorax status post left thoracentesis. 2. Large left pleural effusion, mildly decreased from previous exam. Electronically Signed   By: Kerby Moors M.D.   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 Electronically Signed   By: Kellie Simmering DO   On: 12/26/2019 15:04   Result Date: 12/26/2019 CLINICAL DATA:  Adenocarcinoma of left lung. Metastatic disease evaluation. EXAM: MRI HEAD WITHOUT AND WITH CONTRAST TECHNIQUE: Multiplanar, multiecho pulse sequences of the brain and surrounding structures were obtained without and with intravenous contrast. CONTRAST:  38m GADAVIST GADOBUTROL 1 MMOL/ML IV SOLN COMPARISON:  PET-CT 12/22/2019. FINDINGS: Brain: Mild intermittent motion degradation. Cerebral volume is normal for age. There is a 1.4 x 1.3 x 1.1 cm dural-based mass overlying the left frontal calvarium demonstrating relatively homogeneous enhancement as well as a dural tail (series 16, image 82) (series 17, image 17). Mild mass effect upon the underlying left frontal lobe. Additionally, there is mild vasogenic edema within the underlying left frontal lobe. No abnormal intracranial  enhancement is identified elsewhere to suggest intracranial metastatic disease. A subcentimeter curvilinear focus of enhancement within the right basal ganglia appears vascular. Additional mild multifocal T2/FLAIR hyperintensity within the cerebral white matter and pons is nonspecific, but compatible with chronic small vessel ischemic disease. Small chronic left thalamic lacunar infarct. There is no acute infarct. No chronic intracranial blood products. No extra-axial fluid collection. No midline shift. Vascular: Expected proximal arterial flow voids. Skull and upper cervical spine: 4 mm T1 hypointense and T2/FLAIR hyperintense enhancing focus within the right frontal calvarium (series 16, image 108). No focal marrow lesion identified elsewhere. Sinuses/Orbits: Prior right lens replacement. Visualized orbits show no acute finding. No significant paranasal sinus disease or mastoid effusion. These results will be called to the ordering clinician or representative by the Radiologist Assistant, and communication documented in the PACS or [employer] IMPRESSION: 1.4 x 1.3 cm enhancing dural-based mass overlying the left frontal operculum. While this may reflect a meningioma, a dural-based metastatic lesion cannot be excluded. Mild mass effect upon the underlying left frontal lobe with mild parenchymal edema. 4 mm indeterminate enhancing lesion within the right frontal calvarium for which a small osseous metastasis cannot be excluded. Attention recommended on follow-up. Mild chronic small vessel ischemic disease. This includes  a small chronic left thalamic lacunar infarct. Electronically Signed: By: Kellie Simmering DO On: 12/26/2019 14:59   US Thoracentesis Asp Pleural space w/IMG guide  Result Date: 01/04/2020 INDICATION: Patient with history of lung cancer, dyspnea, and recurrent left pleural effusion. Request is made for diagnostic and therapeutic left thoracentesis. EXAM: ULTRASOUND GUIDED DIAGNOSTIC AND  THERAPEUTIC LEFT THORACENTESIS MEDICATIONS: 10 mL 1% lidocaine COMPLICATIONS: None immediate. PROCEDURE: An ultrasound guided thoracentesis was thoroughly discussed with the patient and questions answered. The benefits, risks, alternatives and complications were also discussed. The patient understands and wishes to proceed with the procedure. Written consent was obtained. Ultrasound was performed to localize and mark an adequate pocket of fluid in the left chest. The area was then prepped and draped in the normal sterile fashion. 1% Lidocaine was used for local anesthesia. Under ultrasound guidance a 6 Fr Safe-T-Centesis catheter was introduced. Thoracentesis was performed. The catheter was removed and a dressing applied. FINDINGS: A total of approximately 1.1 L of dark red fluid was removed. Samples were sent to the laboratory as requested by the clinical team. IMPRESSION: Successful ultrasound guided left thoracentesis yielding 1.1 L of pleural fluid. Read by: Earley Abide, PA-C Electronically Signed   By: Jacqulynn Cadet M.D.   On: 01/04/2020 12:41    ASSESSMENT AND PLAN: This is a very pleasant 64 years old white female recently diagnosed with a stage IV (T3, N0, M1 a) non-small cell lung cancer, adenocarcinoma presented with large left lower lobe lung mass in addition to another lingular mass and malignant left pleural effusion diagnosed in September 2021.  The molecular studies showed positive PD-L1 expression of 95%.  The patient also has K-ras G12C mutation which will be an option for the second line treatment. With the high PD-L1 expression I discussed with the patient treatment with single agent Libtayo (Cempilimab) 350 mg IV every 3 weeks for up to 2 years unless the patient has evidence for disease progression or unacceptable toxicity. The patient started the first cycle of this treatment on January 15, 2020 and tolerated the first cycle of her treatment fairly well. I recommended for her to  continue with this treatment and she will come back for follow-up visit in 2 weeks for evaluation before starting cycle #2. The patient also requested a flu shot and she will receive it today in the clinic. The patient was advised to call immediately if she has any concerning symptoms in the interval. The patient voices understanding of current disease status and treatment options and is in agreement with the current care plan.  All questions were answered. The patient knows to call the clinic with any problems, questions or concerns. We can certainly see the patient much sooner if necessary.   Disclaimer: This note was dictated with voice recognition software. Similar sounding words can inadvertently be transcribed and may not be corrected upon review.

## 2020-01-23 NOTE — Progress Notes (Signed)
Pt is approved for the $1000 [employer] grant.  

## 2020-02-03 NOTE — Progress Notes (Signed)
[name] OFFICE PROGRESS NOTE  Gabrielle Dandy, NP [address] [place] [address]  DIAGNOSIS: Stage IV (T3, N0, M1 a) non-small cell lung cancer, adenocarcinoma presented with large left lower lobe lung mass in addition to another lingual mass and malignant left pleural effusion diagnosed in September 2021  Biomarker Findings Tumor Mutational Burden - 13 Muts/Mb Microsatellite status - MS-Stable Genomic Findings For a complete list of the genes assayed, please refer to the Appendix. KRAS G12C PRDM1 D251f*46 TERT promoter -146C>T TP53 V157F 7 Disease relevant genes with no reportable alterations: ALK, BRAF, EGFR, ERBB2, MET, RET, ROS1   PDL1 Expression: 95%  PRIOR THERAPY: None.  CURRENT THERAPY: Libtayo (Cempilimab) 350 mg IV every 3 weeks.  First dose January 15, 2020.  Status post 2 cycles.  [name] 64y.o. female returns to the clinic for a follow up visit. The patient is feeling well today without any concerning complaints except for some increased dyspnea on exertion. She has a history of a malignant pleural effusion that was drained about 1 month ago. She states that she coughs "very little". She sometimes feels heaviness in the epigastric area when she has a pleural effusion. The patient continues to tolerate treatment with cempilimab well without any adverse effects except for some mild fatigue. Denies any fever, chills, night sweats, or weight loss.  Denies any nausea, vomiting, diarrhea, or constipation. Denies any headache or visual changes. Denies any rashes or skin changes. The patient is here today for evaluation prior to starting cycle # 2   MEDICAL HISTORY: Past Medical History:  Diagnosis Date  . Hyperlipidemia   . Hypertension   . Pleural effusion   . Pneumonia     ALLERGIES:  is allergic to ace inhibitors.  MEDICATIONS:  Current Outpatient Medications  Medication Sig Dispense Refill  .  cholecalciferol (VITAMIN D3) 25 MCG (1000 UNIT) tablet Take 2,000 Units by mouth daily.    .Marland Kitchenlosartan-hydrochlorothiazide (HYZAAR) 100-12.5 MG tablet Take 1 tablet by mouth daily.    . metFORMIN (GLUCOPHAGE) 500 MG tablet Take 500 mg by mouth 2 (two) times daily with a meal.    . pravastatin (PRAVACHOL) 40 MG tablet Take 40 mg by mouth daily.    . prochlorperazine (COMPAZINE) 10 MG tablet Take 1 tablet (10 mg total) by mouth every 6 (six) hours as needed for nausea or vomiting. 30 tablet 0  . sertraline (ZOLOFT) 50 MG tablet Take 75 mg by mouth daily.     No current facility-administered medications for this visit.    SURGICAL HISTORY:  Past Surgical History:  Procedure Laterality Date  . NEPHRECTOMY Left     REVIEW OF SYSTEMS:   Review of Systems  Constitutional: Positive for fatigue. Negative for appetite change, chills, fever and unexpected weight change.  HENT: Negative for mouth sores, nosebleeds, sore throat and trouble swallowing.   Eyes: Negative for eye problems and icterus.  Respiratory: Positive for shortness of breath with exertion. Negative for cough, hemoptysis, and wheezing.   Cardiovascular: Negative for chest pain and leg swelling.  Gastrointestinal: Negative for abdominal pain, constipation, diarrhea, nausea and vomiting.  Genitourinary: Negative for bladder incontinence, difficulty urinating, dysuria, frequency and hematuria.   Musculoskeletal: Negative for back pain, gait problem, neck pain and neck stiffness.  Skin: Negative for itching and rash.  Neurological: Negative for dizziness, extremity weakness, gait problem, headaches, light-headedness and seizures.  Hematological: Negative for adenopathy. Does not bruise/bleed easily.  Psychiatric/Behavioral:  Negative for confusion, depression and sleep disturbance. The patient is not nervous/anxious.     PHYSICAL EXAMINATION:  Blood pressure 131/75, pulse 77, temperature (!) 97 F (36.1 C), temperature source  Tympanic, resp. rate 17, height 5' 4.5" (1.638 m), weight 170 lb (77.1 kg), SpO2 98 %.  ECOG PERFORMANCE STATUS: 1 - Symptomatic but completely ambulatory  Physical Exam  Constitutional: Oriented to person, place, and time and well-developed, well-nourished, and in no distress. HENT:  Head: Normocephalic and atraumatic.  Mouth/Throat: Oropharynx is clear and moist. No oropharyngeal exudate.  Eyes: Conjunctivae are normal. Right eye exhibits no discharge. Left eye exhibits no discharge. No scleral icterus.  Neck: Normal range of motion. Neck supple.  Cardiovascular: Normal rate, regular rhythm, normal heart sounds and intact distal pulses.   Pulmonary/Chest: Effort normal. Decreased breath sounds in left lung. No respiratory distress. No wheezes. No rales.  Abdominal: Soft. Bowel sounds are normal. Exhibits no distension and no mass. There is no tenderness.  Musculoskeletal: Normal range of motion. Exhibits no edema.  Lymphadenopathy:    No cervical adenopathy.  Neurological: Alert and oriented to person, place, and time. Exhibits normal muscle tone. Gait normal. Coordination normal.  Skin: Skin is warm and dry. No rash noted. Not diaphoretic. No erythema. No pallor.  Psychiatric: Mood, memory and judgment normal.  Vitals reviewed.  LABORATORY DATA: Lab Results  Component Value Date   WBC 5.4 02/05/2020   HGB 12.6 02/05/2020   HCT 38.9 02/05/2020   MCV 90.0 02/05/2020   PLT 217 02/05/2020      Chemistry      Component Value Date/Time   NA 140 02/05/2020 1011   K 3.9 02/05/2020 1011   CL 103 02/05/2020 1011   CO2 26 02/05/2020 1011   BUN 23 02/05/2020 1011   CREATININE 0.97 02/05/2020 1011      Component Value Date/Time   CALCIUM 10.1 02/05/2020 1011   ALKPHOS 47 02/05/2020 1011   AST 12 (L) 02/05/2020 1011   ALT 10 02/05/2020 1011   BILITOT 0.4 02/05/2020 1011       RADIOGRAPHIC STUDIES:  No results found.   ASSESSMENT/PLAN:  This is a very pleasant  64 year old Caucasian female recently diagnosed with stage IV (T3, N0, M1 A) non-small cell lung cancer, adenocarcinoma.  She presented with a large left lower lobe lung mass in addition to another lingular mass and a malignant left pleural effusion.  She was diagnosed in September 2021.  Her molecular studies show that she has a PD-L1 expression at 95%.  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  The patient is currently undergoing treatment with single agent Libtayo (Cempilimab) 350 mg IV every 3 weeks for up to 2 years unless the patient has evidence for disease progression or unacceptable toxicity.  She is status post 1 cycle and tolerated it well without any concerning adverse side effects   Labs were reviewed.  Recommend that she proceed with cycle #2 today scheduled.  The patient notes some increase in shortness of breath. She has a history of a left pleural effusion. Her oxygen saturation is 98% today. She is resting comfortably and is not in any acute distress. There were decreased breath sounds in the left lung. I will arrange for a CXR to be performed today to see if the quantity of her effusion. I will arrange for a thoracentesis if needed. Given her exam today, I suspect that she will  need another thoracentesis. Briefly discussed that if she re-accumulates we may consider referring her to CT surgery for consideration of pleurx catheter. She is not interested at this time but would be open to discussion in the future if she needs more thoracenteses.   We will see her back for follow-up visit in 3 weeks for evaluation before starting cycle #3.  The patient was advised to call immediately if she has any concerning symptoms in the interval. The patient voices understanding of current disease status and treatment options and is in agreement with the current care plan. All questions were answered. The patient knows to call the clinic with any  problems, questions or concerns. We can certainly see the patient much sooner if necessary   Orders Placed This Encounter  Procedures  . DG Chest 2 View    Standing Status:   Future    Standing Expiration Date:   02/04/2021    Order Specific Question:   Reason for Exam (SYMPTOM  OR DIAGNOSIS REQUIRED)    Answer:   Lung cancer and increases shortness of breath and history of malignant pleural effusion    Order Specific Question:   Preferred imaging location?    Answer:   [facility], PA-C 02/05/20

## 2020-02-05 ENCOUNTER — Ambulatory Visit (HOSPITAL_COMMUNITY)
Admission: RE | Admit: 2020-02-05 | Discharge: 2020-02-05 | Disposition: A | Discharge status: Home / Self Care | Payer: Medicare Other | Source: Ambulatory Visit | Attending: Physician Assistant | Admitting: Physician Assistant

## 2020-02-05 ENCOUNTER — Other Ambulatory Visit: Payer: Self-pay

## 2020-02-05 ENCOUNTER — Telehealth: Payer: Self-pay | Admitting: Physician Assistant

## 2020-02-05 ENCOUNTER — Encounter: Payer: Self-pay | Admitting: Physician Assistant

## 2020-02-05 ENCOUNTER — Inpatient Hospital Stay (HOSPITAL_BASED_OUTPATIENT_CLINIC_OR_DEPARTMENT_OTHER): Payer: Medicare Other | Admitting: Physician Assistant

## 2020-02-05 ENCOUNTER — Inpatient Hospital Stay: Payer: Medicare Other

## 2020-02-05 ENCOUNTER — Other Ambulatory Visit: Payer: Self-pay | Admitting: Physician Assistant

## 2020-02-05 VITALS — BP 131/75 | HR 77 | Temp 97.0°F | Resp 17 | Ht 64.5 in | Wt 170.0 lb

## 2020-02-05 DIAGNOSIS — C3492 Malignant neoplasm of unspecified part of left bronchus or lung: Secondary | ICD-10-CM

## 2020-02-05 DIAGNOSIS — Z5112 Encounter for antineoplastic immunotherapy: Secondary | ICD-10-CM | POA: Diagnosis not present

## 2020-02-05 DIAGNOSIS — J90 Pleural effusion, not elsewhere classified: Secondary | ICD-10-CM

## 2020-02-05 DIAGNOSIS — R5382 Chronic fatigue, unspecified: Secondary | ICD-10-CM

## 2020-02-05 LAB — CBC WITH DIFFERENTIAL (CANCER CENTER ONLY)
Abs Immature Granulocytes: 0.02 10*3/uL (ref 0.00–0.07)
Basophils Absolute: 0.1 10*3/uL (ref 0.0–0.1)
Basophils Relative: 1 %
Eosinophils Absolute: 0.3 10*3/uL (ref 0.0–0.5)
Eosinophils Relative: 5 %
HCT: 38.9 % (ref 36.0–46.0)
Hemoglobin: 12.6 g/dL (ref 12.0–15.0)
Immature Granulocytes: 0 %
Lymphocytes Relative: 22 %
Lymphs Abs: 1.2 10*3/uL (ref 0.7–4.0)
MCH: 29.2 pg (ref 26.0–34.0)
MCHC: 32.4 g/dL (ref 30.0–36.0)
MCV: 90 fL (ref 80.0–100.0)
Monocytes Absolute: 0.5 10*3/uL (ref 0.1–1.0)
Monocytes Relative: 9 %
Neutro Abs: 3.4 10*3/uL (ref 1.7–7.7)
Neutrophils Relative %: 63 %
Platelet Count: 217 10*3/uL (ref 150–400)
RBC: 4.32 MIL/uL (ref 3.87–5.11)
RDW: 12.9 % (ref 11.5–15.5)
WBC Count: 5.4 10*3/uL (ref 4.0–10.5)
nRBC: 0 % (ref 0.0–0.2)

## 2020-02-05 LAB — CMP (CANCER CENTER ONLY)
ALT: 10 U/L (ref 0–44)
AST: 12 U/L — ABNORMAL LOW (ref 15–41)
Albumin: 3.8 g/dL (ref 3.5–5.0)
Alkaline Phosphatase: 47 U/L (ref 38–126)
Anion gap: 11 (ref 5–15)
BUN: 23 mg/dL (ref 8–23)
CO2: 26 mmol/L (ref 22–32)
Calcium: 10.1 mg/dL (ref 8.9–10.3)
Chloride: 103 mmol/L (ref 98–111)
Creatinine: 0.97 mg/dL (ref 0.44–1.00)
GFR, Estimated: >60 mL/min (ref >60)
Glucose, Bld: 125 mg/dL — ABNORMAL HIGH (ref 70–99)
Potassium: 3.9 mmol/L (ref 3.5–5.1)
Sodium: 140 mmol/L (ref 135–145)
Total Bilirubin: 0.4 mg/dL (ref 0.3–1.2)
Total Protein: 7.3 g/dL (ref 6.5–8.1)

## 2020-02-05 LAB — TSH: TSH: 3.525 u[IU]/mL (ref 0.308–3.960)

## 2020-02-05 MED ORDER — SODIUM CHLORIDE 0.9 % IV SOLN
350.0000 mg | Freq: Once | INTRAVENOUS | Status: AC
  Administered 2020-02-05: 350 mg via INTRAVENOUS
  Filled 2020-02-05: qty 7

## 2020-02-05 MED ORDER — SODIUM CHLORIDE 0.9 % IV SOLN
Freq: Once | INTRAVENOUS | Status: AC
  Filled 2020-02-05: qty 250

## 2020-02-05 NOTE — Progress Notes (Signed)
Pt stable at discharge with no complaints/concerns

## 2020-02-05 NOTE — Telephone Encounter (Signed)
I called the patient. I was unable to reach her. She gave me permission earlier today to leave a detailed voicemail and we discussed the likelihood of needing a thoracentesis at length durign her appointment today. Her CXR showed a large left pleural effusion. I was calling her to let her know I have placed an order for a thoracentesis. If she has questions, she was advised to call us back.

## 2020-02-05 NOTE — Patient Instructions (Signed)
[facility] Discharge Instructions for Patients Receiving Chemotherapy  Today you received the following chemotherapy agents Libtayo  To help prevent nausea and vomiting after your treatment, we encourage you to take your nausea medication as directed.    If you develop nausea and vomiting that is not controlled by your nausea medication, call the clinic.   BELOW ARE SYMPTOMS THAT SHOULD BE REPORTED IMMEDIATELY:  *FEVER GREATER THAN 100.5 F  *CHILLS WITH OR WITHOUT FEVER  NAUSEA AND VOMITING THAT IS NOT CONTROLLED WITH YOUR NAUSEA MEDICATION  *UNUSUAL SHORTNESS OF BREATH  *UNUSUAL BRUISING OR BLEEDING  TENDERNESS IN MOUTH AND THROAT WITH OR WITHOUT PRESENCE OF ULCERS  *URINARY PROBLEMS  *BOWEL PROBLEMS  UNUSUAL RASH Items with * indicate a potential emergency and should be followed up as soon as possible.  Feel free to call the clinic should you have any questions or concerns. The clinic phone number is [phone].  Please show the [facility] at check-in to the Emergency Department and triage nurse.

## 2020-02-06 ENCOUNTER — Telehealth: Payer: Self-pay | Admitting: Physician Assistant

## 2020-02-06 NOTE — Telephone Encounter (Signed)
Scheduled per los. Called and spoke with patient. Confirmed appts  

## 2020-02-07 ENCOUNTER — Other Ambulatory Visit (HOSPITAL_COMMUNITY)
Admission: RE | Admit: 2020-02-07 | Discharge: 2020-02-07 | Disposition: A | Discharge status: Home / Self Care | Payer: Medicare Other | Source: Ambulatory Visit | Attending: Physician Assistant | Admitting: Physician Assistant

## 2020-02-07 DIAGNOSIS — Z01812 Encounter for preprocedural laboratory examination: Secondary | ICD-10-CM | POA: Diagnosis present

## 2020-02-07 DIAGNOSIS — Z20822 Contact with and (suspected) exposure to covid-19: Secondary | ICD-10-CM | POA: Insufficient documentation

## 2020-02-07 LAB — SARS CORONAVIRUS 2 (TAT 6-24 HRS): SARS Coronavirus 2: NEGATIVE

## 2020-02-08 ENCOUNTER — Ambulatory Visit (HOSPITAL_COMMUNITY)
Admission: RE | Admit: 2020-02-08 | Discharge: 2020-02-08 | Disposition: A | Discharge status: Home / Self Care | Payer: Medicare Other | Source: Ambulatory Visit | Attending: Physician Assistant | Admitting: Physician Assistant

## 2020-02-08 ENCOUNTER — Other Ambulatory Visit: Payer: Self-pay

## 2020-02-08 ENCOUNTER — Ambulatory Visit (HOSPITAL_COMMUNITY)
Admission: RE | Admit: 2020-02-08 | Discharge: 2020-02-08 | Disposition: A | Discharge status: Home / Self Care | Payer: Medicare Other | Source: Ambulatory Visit | Attending: Radiology | Admitting: Radiology

## 2020-02-08 DIAGNOSIS — J90 Pleural effusion, not elsewhere classified: Secondary | ICD-10-CM

## 2020-02-08 DIAGNOSIS — Z9889 Other specified postprocedural states: Secondary | ICD-10-CM

## 2020-02-08 MED ORDER — LIDOCAINE HCL 1 % IJ SOLN
INTRAMUSCULAR | Status: AC
  Filled 2020-02-08: qty 20

## 2020-02-08 NOTE — Procedures (Addendum)
Ultrasound-guided therapeutic left thoracentesis performed yielding 1.2 liters of dark reddish brown fluid. No immediate complications. Follow-up chest x-ray pending. EBL < 1 cc.  Due to patient coughing and chest discomfort only the above amount of fluid was removed today.

## 2020-02-26 ENCOUNTER — Other Ambulatory Visit: Payer: Self-pay

## 2020-02-26 ENCOUNTER — Encounter: Payer: Self-pay | Admitting: Internal Medicine

## 2020-02-26 ENCOUNTER — Inpatient Hospital Stay: Payer: Medicare Other

## 2020-02-26 ENCOUNTER — Encounter: Payer: Self-pay | Admitting: *Deleted

## 2020-02-26 ENCOUNTER — Inpatient Hospital Stay: Payer: Medicare Other | Attending: Internal Medicine | Admitting: Internal Medicine

## 2020-02-26 VITALS — BP 133/59 | HR 78 | Temp 97.5°F | Resp 18 | Ht 64.5 in | Wt 169.6 lb

## 2020-02-26 DIAGNOSIS — Z5112 Encounter for antineoplastic immunotherapy: Secondary | ICD-10-CM | POA: Insufficient documentation

## 2020-02-26 DIAGNOSIS — J910 Malignant pleural effusion: Secondary | ICD-10-CM | POA: Diagnosis not present

## 2020-02-26 DIAGNOSIS — C3490 Malignant neoplasm of unspecified part of unspecified bronchus or lung: Secondary | ICD-10-CM | POA: Diagnosis not present

## 2020-02-26 DIAGNOSIS — C3432 Malignant neoplasm of lower lobe, left bronchus or lung: Secondary | ICD-10-CM | POA: Diagnosis present

## 2020-02-26 DIAGNOSIS — C3492 Malignant neoplasm of unspecified part of left bronchus or lung: Secondary | ICD-10-CM

## 2020-02-26 DIAGNOSIS — Z79899 Other long term (current) drug therapy: Secondary | ICD-10-CM | POA: Insufficient documentation

## 2020-02-26 DIAGNOSIS — J90 Pleural effusion, not elsewhere classified: Secondary | ICD-10-CM | POA: Diagnosis not present

## 2020-02-26 DIAGNOSIS — Z7984 Long term (current) use of oral hypoglycemic drugs: Secondary | ICD-10-CM | POA: Diagnosis not present

## 2020-02-26 DIAGNOSIS — I10 Essential (primary) hypertension: Secondary | ICD-10-CM | POA: Insufficient documentation

## 2020-02-26 DIAGNOSIS — E785 Hyperlipidemia, unspecified: Secondary | ICD-10-CM | POA: Diagnosis not present

## 2020-02-26 DIAGNOSIS — R5382 Chronic fatigue, unspecified: Secondary | ICD-10-CM

## 2020-02-26 DIAGNOSIS — Z905 Acquired absence of kidney: Secondary | ICD-10-CM | POA: Insufficient documentation

## 2020-02-26 LAB — CBC WITH DIFFERENTIAL (CANCER CENTER ONLY)
Abs Immature Granulocytes: 0.02 10*3/uL (ref 0.00–0.07)
Basophils Absolute: 0.1 10*3/uL (ref 0.0–0.1)
Basophils Relative: 1 %
Eosinophils Absolute: 0.2 10*3/uL (ref 0.0–0.5)
Eosinophils Relative: 5 %
HCT: 40.5 % (ref 36.0–46.0)
Hemoglobin: 12.9 g/dL (ref 12.0–15.0)
Immature Granulocytes: 0 %
Lymphocytes Relative: 20 %
Lymphs Abs: 0.9 10*3/uL (ref 0.7–4.0)
MCH: 28.4 pg (ref 26.0–34.0)
MCHC: 31.9 g/dL (ref 30.0–36.0)
MCV: 89 fL (ref 80.0–100.0)
Monocytes Absolute: 0.3 10*3/uL (ref 0.1–1.0)
Monocytes Relative: 6 %
Neutro Abs: 3.2 10*3/uL (ref 1.7–7.7)
Neutrophils Relative %: 68 %
Platelet Count: 225 10*3/uL (ref 150–400)
RBC: 4.55 MIL/uL (ref 3.87–5.11)
RDW: 12.7 % (ref 11.5–15.5)
WBC Count: 4.7 10*3/uL (ref 4.0–10.5)
nRBC: 0 % (ref 0.0–0.2)

## 2020-02-26 LAB — CMP (CANCER CENTER ONLY)
ALT: 13 U/L (ref 0–44)
AST: 14 U/L — ABNORMAL LOW (ref 15–41)
Albumin: 3.8 g/dL (ref 3.5–5.0)
Alkaline Phosphatase: 52 U/L (ref 38–126)
Anion gap: 11 (ref 5–15)
BUN: 25 mg/dL — ABNORMAL HIGH (ref 8–23)
CO2: 27 mmol/L (ref 22–32)
Calcium: 10 mg/dL (ref 8.9–10.3)
Chloride: 103 mmol/L (ref 98–111)
Creatinine: 0.94 mg/dL (ref 0.44–1.00)
GFR, Estimated: >60 mL/min (ref >60)
Glucose, Bld: 93 mg/dL (ref 70–99)
Potassium: 3.8 mmol/L (ref 3.5–5.1)
Sodium: 141 mmol/L (ref 135–145)
Total Bilirubin: 0.4 mg/dL (ref 0.3–1.2)
Total Protein: 7.7 g/dL (ref 6.5–8.1)

## 2020-02-26 LAB — TSH: TSH: 1.38 u[IU]/mL (ref 0.308–3.960)

## 2020-02-26 MED ORDER — SODIUM CHLORIDE 0.9 % IV SOLN
350.0000 mg | Freq: Once | INTRAVENOUS | Status: AC
  Administered 2020-02-26: 350 mg via INTRAVENOUS
  Filled 2020-02-26: qty 7

## 2020-02-26 MED ORDER — SODIUM CHLORIDE 0.9 % IV SOLN
Freq: Once | INTRAVENOUS | Status: AC
  Filled 2020-02-26: qty 250

## 2020-02-26 NOTE — Patient Instructions (Signed)
[facility] Discharge Instructions for Patients Receiving Chemotherapy  Today you received the following chemotherapy agents Cemiplimab-rwlc (LIBTAYO).  To help prevent nausea and vomiting after your treatment, we encourage you to take your nausea medication as prescribed.   If you develop nausea and vomiting that is not controlled by your nausea medication, call the clinic.   BELOW ARE SYMPTOMS THAT SHOULD BE REPORTED IMMEDIATELY:  *FEVER GREATER THAN 100.5 F  *CHILLS WITH OR WITHOUT FEVER  NAUSEA AND VOMITING THAT IS NOT CONTROLLED WITH YOUR NAUSEA MEDICATION  *UNUSUAL SHORTNESS OF BREATH  *UNUSUAL BRUISING OR BLEEDING  TENDERNESS IN MOUTH AND THROAT WITH OR WITHOUT PRESENCE OF ULCERS  *URINARY PROBLEMS  *BOWEL PROBLEMS  UNUSUAL RASH Items with * indicate a potential emergency and should be followed up as soon as possible.  Feel free to call the clinic should you have any questions or concerns. The clinic phone number is [phone].  Please show the [employer] at check-in to the Emergency Department and triage nurse.

## 2020-02-26 NOTE — Progress Notes (Signed)
[place] [phone]   [phone]  OFFICE PROGRESS NOTE  [name], NP [place] [address]  DIAGNOSIS: stage IV (T3, N0, M1 a) non-small cell lung cancer, adenocarcinoma presented with large left lower lobe lung mass in addition to another lingual mass and malignant left pleural effusion diagnosed in September 2021  Biomarker Findings Tumor Mutational Burden - 13 Muts/Mb Microsatellite status - MS-Stable Genomic Findings For a complete list of the genes assayed, please refer to the Appendix. KRAS G12C PRDM1 D258f*46 TERT promoter -146C>T TP53 V157F 7 Disease relevant genes with no reportable alterations: ALK, BRAF, EGFR, ERBB2, MET, RET, ROS1   PDL1 Expression: 95%  PRIOR THERAPY: None.  CURRENT THERAPY: Libtayo (Cempilimab) 350 mg IV every 3 weeks.  First dose January 15, 2020.  Status post 2 cycles.  [name] 64y.o. female returns to the clinic today for follow-up visit accompanied by her husband. The patient continues to tolerate her treatment fairly well with no concerning complaints. She underwent ultrasound-guided left thoracentesis on February 08, 2020 with drainage of 1.2 L of pleural fluid. It did hurt her this time. She continues to have mild shortness of breath but no significant chest pain, cough or hemoptysis. She denied having any fever or chills. She has no nausea, vomiting, diarrhea or constipation. She has no headache or visual changes. She is here today for evaluation before starting cycle #3 of her treatment.   MEDICAL HISTORY: Past Medical History:  Diagnosis Date  . Hyperlipidemia   . Hypertension   . Pleural effusion   . Pneumonia     ALLERGIES:  is allergic to ace inhibitors.  MEDICATIONS:  Current Outpatient Medications  Medication Sig Dispense Refill  . cholecalciferol (VITAMIN D3) 25 MCG (1000 UNIT) tablet Take 2,000 Units by mouth daily.    .Marland Kitchen losartan-hydrochlorothiazide (HYZAAR) 100-12.5 MG tablet Take 1 tablet by mouth daily.    . metFORMIN (GLUCOPHAGE) 500 MG tablet Take 500 mg by mouth 2 (two) times daily with a meal.    . pravastatin (PRAVACHOL) 40 MG tablet Take 40 mg by mouth daily.    . prochlorperazine (COMPAZINE) 10 MG tablet Take 1 tablet (10 mg total) by mouth every 6 (six) hours as needed for nausea or vomiting. 30 tablet 0  . sertraline (ZOLOFT) 50 MG tablet Take 75 mg by mouth daily.     No current facility-administered medications for this visit.    SURGICAL HISTORY:  Past Surgical History:  Procedure Laterality Date  . NEPHRECTOMY Left     REVIEW OF SYSTEMS:  A comprehensive review of systems was negative except for: Respiratory: positive for dyspnea on exertion   PHYSICAL EXAMINATION: General appearance: alert, cooperative and no distress Head: Normocephalic, without obvious abnormality, atraumatic Neck: no adenopathy, no JVD, supple, symmetrical, trachea midline and thyroid not enlarged, symmetric, no tenderness/mass/nodules Lymph nodes: Cervical, supraclavicular, and axillary nodes normal. Resp: diminished breath sounds LLL and dullness to percussion LLL Back: symmetric, no curvature. ROM normal. No CVA tenderness. Cardio: regular rate and rhythm, S1, S2 normal, no murmur, click, rub or gallop GI: soft, non-tender; bowel sounds normal; no masses,  no organomegaly Extremities: extremities normal, atraumatic, no cyanosis or edema  ECOG PERFORMANCE STATUS: 1 - Symptomatic but completely ambulatory  Blood pressure (!) 133/59, pulse 78, temperature (!) 97.5 F (36.4 C), temperature source Tympanic, resp. rate 18, height 5' 4.5" (1.638 m), weight 169 lb 9.6  oz (76.9 kg), SpO2 97 %.  LABORATORY DATA: Lab Results  Component Value Date   WBC 5.4 02/05/2020   HGB 12.6 02/05/2020   HCT 38.9 02/05/2020   MCV 90.0 02/05/2020   PLT 217 02/05/2020      Chemistry      Component Value Date/Time   NA 140  02/05/2020 1011   K 3.9 02/05/2020 1011   CL 103 02/05/2020 1011   CO2 26 02/05/2020 1011   BUN 23 02/05/2020 1011   CREATININE 0.97 02/05/2020 1011      Component Value Date/Time   CALCIUM 10.1 02/05/2020 1011   ALKPHOS 47 02/05/2020 1011   AST 12 (L) 02/05/2020 1011   ALT 10 02/05/2020 1011   BILITOT 0.4 02/05/2020 1011       RADIOGRAPHIC STUDIES: DG Chest 1 View  Result Date: 02/08/2020 CLINICAL DATA:  Status post left thoracentesis. EXAM: CHEST  1 VIEW COMPARISON:  Chest x-ray dated February 05, 2020. FINDINGS: Stable cardiomediastinal silhouette with obscured left heart border. Persistent large left pleural effusion with slight interval improvement in aeration of the left upper lobe. The left lower lobe and lingula remain collapsed. The right lung is clear. No pneumothorax. No acute osseous abnormality. IMPRESSION: 1. Persistent large left pleural effusion with slight interval improvement in aeration of the left upper lobe. No pneumothorax. Electronically Signed   By: Obie Dredge M.D.   On: 02/08/2020 14:31   DG Chest 2 View  Result Date: 02/05/2020 CLINICAL DATA:  History of lung cancer with worsening shortness of breath. History of malignant pleural effusion. EXAM: CHEST - 2 VIEW COMPARISON:  01/04/2020 FINDINGS: Interval worsening of large left pleural effusion with minimal aeration over the left apex. Likely associated compressive atelectasis in the left mid to lower lung. Right lung is clear. Cardiomediastinal silhouette and remainder of the exam is unchanged. IMPRESSION: Interval worsening of large left pleural effusion likely with associated compressive atelectasis. Electronically Signed   By: Elberta Fortis M.D.   On: 02/05/2020 13:39   US Thoracentesis Asp Pleural space w/IMG guide  Result Date: 02/08/2020 INDICATION: Patient with history of stage IV lung cancer, dyspnea, recurrent left pleural effusion. Request made for therapeutic left thoracentesis. EXAM: ULTRASOUND  GUIDED THERAPEUTIC LEFT THORACENTESIS MEDICATIONS: 1% lidocaine to skin and subcutaneous tissue COMPLICATIONS: None immediate. PROCEDURE: An ultrasound guided thoracentesis was thoroughly discussed with the patient and questions answered. The benefits, risks, alternatives and complications were also discussed. The patient understands and wishes to proceed with the procedure. Written consent was obtained. Ultrasound was performed to localize and mark an adequate pocket of fluid in the left chest. The area was then prepped and draped in the normal sterile fashion. 1% Lidocaine was used for local anesthesia. Under ultrasound guidance a 6 Fr Safe-T-Centesis catheter was introduced. Thoracentesis was performed. The catheter was removed and a dressing applied. FINDINGS: A total of approximately 1.2 liters of dark reddish brown fluid was removed. Due to patient coughing and chest discomfort only the above amount of fluid was removed today. IMPRESSION: Successful ultrasound guided therapeutic left thoracentesis yielding 1.2 liters of pleural fluid. Read by: Jeananne Rama, PA-C Electronically Signed   By: Malachy Moan M.D.   On: 02/08/2020 14:20    ASSESSMENT AND PLAN: This is a very pleasant 64 years old white female recently diagnosed with a stage IV (T3, N0, M1 a) non-small cell lung cancer, adenocarcinoma presented with large left lower lobe lung mass in addition to another lingular mass and malignant left pleural  effusion diagnosed in September 2021.  The molecular studies showed positive PD-L1 expression of 95%.  The patient also has K-ras G12C mutation which will be an option for the second line treatment. With the high PD-L1 expression I discussed with the patient treatment with single agent Libtayo (Cempilimab) 350 mg IV every 3 weeks for up to 2 years unless the patient has evidence for disease progression or unacceptable toxicity. The patient started the first cycle of this treatment on January 15, 2020.  She is status post two cycles of treatment and has been tolerating it fairly well. I recommended for the patient to proceed with cycle #3 today as planned. For the recurrent left pleural effusion, I will arrange for the patient to see cardiothoracic surgery for consideration of Pleurx catheter placement. I will see her back for follow-up visit in 3 weeks for evaluation with repeat CT scan of the chest, abdomen and pelvis for restaging of her disease. The patient was advised to call immediately if she has any concerning symptoms in the interval. The patient voices understanding of current disease status and treatment options and is in agreement with the current care plan.  All questions were answered. The patient knows to call the clinic with any problems, questions or concerns. We can certainly see the patient much sooner if necessary.   Disclaimer: This note was dictated with voice recognition software. Similar sounding words can inadvertently be transcribed and may not be corrected upon review.

## 2020-02-26 NOTE — Progress Notes (Signed)
I spoke with Gabrielle [name] and her husband today.  She is doing well with her treatment is very positive with everything she is going through.  It was a pleasure seeing them today.  Dr. Julien Nordmann has referred patient to [facility].  Referral completed and I will update their team on referral.

## 2020-02-27 ENCOUNTER — Telehealth: Payer: Self-pay | Admitting: Internal Medicine

## 2020-02-27 NOTE — Telephone Encounter (Signed)
Scheduled appts per 12/20 los. Pt confirmed appt date and time.  

## 2020-03-04 ENCOUNTER — Ambulatory Visit
Admission: RE | Admit: 2020-03-04 | Discharge: 2020-03-04 | Disposition: A | Discharge status: Home / Self Care | Payer: Medicare Other | Source: Ambulatory Visit | Attending: Thoracic Surgery (Cardiothoracic Vascular Surgery) | Admitting: Thoracic Surgery (Cardiothoracic Vascular Surgery)

## 2020-03-04 ENCOUNTER — Institutional Professional Consult (permissible substitution) (INDEPENDENT_AMBULATORY_CARE_PROVIDER_SITE_OTHER): Payer: Medicare Other | Admitting: Thoracic Surgery (Cardiothoracic Vascular Surgery)

## 2020-03-04 ENCOUNTER — Other Ambulatory Visit: Payer: Self-pay | Admitting: *Deleted

## 2020-03-04 ENCOUNTER — Other Ambulatory Visit: Payer: Self-pay

## 2020-03-04 ENCOUNTER — Encounter: Payer: Self-pay | Admitting: Thoracic Surgery (Cardiothoracic Vascular Surgery)

## 2020-03-04 VITALS — BP 133/79 | HR 96 | Resp 20 | Ht 64.0 in | Wt 165.6 lb

## 2020-03-04 DIAGNOSIS — J90 Pleural effusion, not elsewhere classified: Secondary | ICD-10-CM

## 2020-03-04 NOTE — Progress Notes (Signed)
PCP is Gabrielle Dandy, NP Referring Provider is Gabrielle Bears, MD  Chief Complaint  Patient presents with  . Pleural Effusion    Initial surgical consult cxr today, US thoracentesis 12/2    HPI: Gabrielle [name] sent for consultation regarding a malignant left pleural effusion.  Gabrielle [name] is a 64 year old woman with a history of hypertension, hyperlipidemia, left nephrectomy, and tobacco abuse.  She started having issues with shortness of breath back in August.  She had an appointment with her primary care Gabrielle [name] in September.  She mentioned her shortness of breath, which led to a chest x-ray and then CT and PET scans.  She had a large left pleural effusion.  CT confirmed a left pleural effusion.  And ultrasound-guided thoracentesis on 11/27/2019 drained about 1.2 L of bloody fluid.  Cytology showed adenocarcinoma of the lung.  She was referred to Dr. Erin [name].  A PET/CT on 12/22/2019 showed a 5.3 cm centrally necrotic left lower lobe lung mass with a large left pleural effusion and some pleural thickening.  There was no extrathoracic metastatic disease.  She then was referred to Dr. Julien [name].  Foundation 1 testing showed K-ras G 12 C mutation.  Her tumor had PD-L1 expression of 95%.  She had a second thoracentesis on 01/04/2020 for which drained 1100 mL of pleural fluid.  Her symptoms improved for about 3 weeks but then she became more short of breath.  She had a third thoracentesis on 02/08/2020 which drained 1.2 L of bloody fluid.  She did have some pain with the most recent thoracentesis.  She again had some symptomatic relief after the thoracentesis.  She feels like she started to get some fluid back she is a little more short of breath.  Her symptoms have not progressed to the point they were prior to her most recent thoracentesis.  She smoked about a pack a day for 40 years prior to quitting in 2016. Past Medical History:  Diagnosis Date  . Hyperlipidemia   . Hypertension   . Pleural effusion    . Pneumonia   . Stage IV adenocarcinoma of lung, left [facility])     Past Surgical History:  Procedure Laterality Date  . NEPHRECTOMY Left     Family History  Problem Relation Age of Onset  . COPD Mother     Social History Social History   Tobacco Use  . Smoking status: Former Smoker    Packs/day: 1.00    Years: 40.00    Pack years: 40.00    Types: Cigarettes    Quit date: 2016    Years since quitting: 5.9  . Smokeless tobacco: Never Used    Current Outpatient Medications  Medication Sig Dispense Refill  . cholecalciferol (VITAMIN D3) 25 MCG (1000 UNIT) tablet Take 2,000 Units by mouth daily.    Marland Kitchen losartan-hydrochlorothiazide (HYZAAR) 100-12.5 MG tablet Take 1 tablet by mouth daily.    . metFORMIN (GLUCOPHAGE) 500 MG tablet Take 500 mg by mouth 2 (two) times daily with a meal.    . pravastatin (PRAVACHOL) 40 MG tablet Take 40 mg by mouth daily.    . sertraline (ZOLOFT) 50 MG tablet Take 75 mg by mouth daily.    . prochlorperazine (COMPAZINE) 10 MG tablet Take 1 tablet (10 mg total) by mouth every 6 (six) hours as needed for nausea or vomiting. (Patient not taking: Reported on 03/04/2020) 30 tablet 0   No current facility-administered medications for this visit.    Allergies  Allergen Reactions  .  Ace Inhibitors Cough    Review of Systems  Constitutional: Positive for activity change, fatigue and unexpected weight change (Lost 11 pounds in 3 months).  HENT: Negative for trouble swallowing and voice change.   Respiratory: Positive for cough, shortness of breath and wheezing.   Cardiovascular: Negative for chest pain and leg swelling.  All other systems reviewed and are negative.   BP 133/79 (BP Location: Right Arm, Patient Position: Sitting)   Pulse 96   Resp 20   Ht '5\' 4"'  (1.626 m)   Wt 165 lb 9.6 oz (75.1 kg)   SpO2 91% Comment: RA with mask on  BMI 28.43 kg/m  Physical Exam Vitals reviewed.  Constitutional:      General: She is not in acute distress.     Appearance: Normal appearance.  HENT:     Head: Normocephalic and atraumatic.  Eyes:     General: No scleral icterus.    Extraocular Movements: Extraocular movements intact.  Cardiovascular:     Rate and Rhythm: Normal rate and regular rhythm.     Heart sounds: Normal heart sounds. No murmur heard.   Pulmonary:     Effort: Pulmonary effort is normal.     Comments: Decreased breath sounds throughout left side, absent left base Abdominal:     General: There is no distension.     Palpations: Abdomen is soft.  Musculoskeletal:        General: No swelling.  Skin:    General: Skin is warm and dry.  Neurological:     General: No focal deficit present.     Mental Status: She is alert and oriented to person, place, and time.     Cranial Nerves: No cranial nerve deficit.     Motor: No weakness.    Diagnostic Tests: NUCLEAR MEDICINE PET SKULL BASE TO THIGH  TECHNIQUE: 8.2 mCi F-18 FDG was injected intravenously. Full-ring PET imaging was performed from the skull base to thigh after the radiotracer. CT data was obtained and used for attenuation correction and anatomic localization.  Fasting blood glucose: 118 mg/dl  COMPARISON:  None.  FINDINGS: Mediastinal blood pool activity: SUV max 2.9  Liver activity: SUV max NA  NECK: There are small hypermetabolic soft tissue nodules in the parotid glands bilaterally, largest 1.4 x 0.9 cm on the right with max SUV 8.5 (series 4/image 21) and 0.9 x 0.6 cm on the left with max SUV 4.0 (series 4/image 23).  No enlarged or hypermetabolic lymph nodes in the neck.  Incidental CT findings: none  CHEST:  Large left pleural effusion with diffuse left pleural thickening and hypermetabolism. Representative focus of hypermetabolism in the medial basilar left pleural space with max SUV 11.1.  Suggestion of a hypermetabolic 5.3 x 5.1 cm centrally necrotic left lower lobe lung mass with max SUV 12.3 (series 4/image 86).  Complete left lower lobe atelectasis. Suggestion of a separate 2.7 cm hypermetabolic pulmonary nodule in the lingula with max SUV 13.4 (series 4/image 76). Complete lingular atelectasis.  No enlarged or hypermetabolic axillary, mediastinal or hilar lymph nodes. No hypermetabolic right pulmonary findings.  Incidental CT findings: Coronary atherosclerosis. Atherosclerotic nonaneurysmal thoracic aorta. No significant right pulmonary nodules.  ABDOMEN/PELVIS: No abnormal hypermetabolic activity within the liver, pancreas, adrenal glands, or spleen. No hypermetabolic lymph nodes in the abdomen or pelvis.  Incidental CT findings: Left nephrectomy. Atherosclerotic nonaneurysmal abdominal aorta. Mild sigmoid diverticulosis.  SKELETON: No focal hypermetabolic activity to suggest skeletal metastasis.  Incidental CT findings: none  IMPRESSION: 1. Suggestion  of a hypermetabolic 5.3 cm centrally necrotic left lower lobe lung mass, compatible with primary bronchogenic carcinoma. Suggestion of a hypermetabolic 2.7 cm pulmonary nodule in the lingula, suspicious for metastasis. Complete left lower lobe and lingular atelectasis. 2. Large malignant left pleural effusion with diffuse left pleural thickening and hypermetabolism. 3. No hypermetabolic thoracic adenopathy or extrathoracic metastatic disease. 4. Hypermetabolic small bilateral parotid gland nodules, largest 1.4 cm on the right, most suggestive of Warthin's tumors. 5. Chronic findings include: Aortic Atherosclerosis (ICD10-I70.0). Coronary atherosclerosis. Mild sigmoid diverticulosis.   Electronically Signed   By: Gabrielle [name] M.D.   On: 12/22/2019 13:11 CHEST - 2 VIEW  COMPARISON:  February 08, 2020  FINDINGS: Large at least partially loculated pleural effusion on the left, slightly smaller compared to prior study. Consolidation in portions of the left lower lobe. Right lung clear. Heart size and  pulmonary vascularity are normal. No adenopathy. No bone lesions.  IMPRESSION: Apparent sizable least partially loculated left pleural effusion posteriorly with consolidation throughout portions of the left lower lobe. Slightly less pleural effusion appreciable compared to most recent study. Right lung clear. Heart size within normal limits.   Electronically Signed   By: Gabrielle [name] M.D.   On: 03/04/2020 13:56 I personally reviewed the PET/CT images and the chest x-ray images.  I compared the chest x-ray images from the films from December 2, November 29, and October 28.  Impression: Gabrielle [name] is a 64 year old former smoker with a history of hypertension, hyperlipidemia, and left nephrectomy.  She had a 40-pack-year history of smoking prior to quitting in 2016.  This fall she presented with shortness of breath with exertion.  Work-up revealed a large left pleural effusion.  Thoracentesis yielded over a liter of fluid.  Cytology was positive for adenocarcinoma.  Molecular testing revealed positive K-ras mutation and PD-L1 expression of 95%.  She currently is on treatment with Cempilimab Orlene Erm).  She is completed 3 cycles of treatment.   She had a large malignant pleural effusion with near white out of the chest.  She has had thoracentesis 3 times now.  Each of these was done about a month apart and each drained a little over a liter.  Her chest x-ray still showed a large residual pleural effusion after each thoracentesis.  On PET CT she had a large left pleural effusion.  Symptomatically she had some relief after each drainage but then would develop shortness of breath again within about 3 to 4 weeks.  Currently she is about 3 weeks from her most recent thoracentesis and has had some return of symptoms but has not reached her previous levels of shortness of breath.  Interestingly her chest x-ray today shows some slight improvement from her most recent postthoracentesis film of  02/08/2020.  Hopefully, that is a sign of response to treatment.  She is not currently severely symptomatic.  There is some suggestion of possible loculation on her chest x-ray.  She is scheduled to have another CT scan on 03/15/2020.  I like to see that CT before we attempt to place a catheter.  Additionally, if there is further signs of resolution on that CT scan, then she might not need a catheter at all.  If she were to become significantly more short of breath in the interim she should call us and we can try to move the CT scan.  I did describe the procedure for pleural catheter placement to Mrs. Salm and her husband.  I informed him of the general  nature of the procedure.  We would plan to do it with local anesthesia and sedation.  We would do it in the operating room.  They understand she would have an indwelling catheter that would need ongoing care.  I did show them the catheter as well as the drainage kit and provided her with some literature regarding pleural catheters.  I informed him of the indications, risks, benefits, and alternatives.  They understand the risks include bleeding, incomplete drainage of effusion, and infection.  She wishes to wait until after her CT in 2 weeks before making a decision regarding the pleural catheter.  I will plan to see her back on March 20, 2020.   Plan: Follow-up with Dr. Julien [name] as scheduled Return on 03/20/2020 to further discuss possible pleural catheter placement  Melrose Nakayama, MD [facility] [phone]

## 2020-03-05 ENCOUNTER — Telehealth: Payer: Self-pay

## 2020-03-05 NOTE — Telephone Encounter (Signed)
Pt called complaining of leg pain.   Discussed with Dr. Julien Nordmann and Rubin Payor, PA-C who advised pt can take Tylenol with occasional use of Motrin,  Pt has been advised as indiciated and expressed understanding of this information.

## 2020-03-14 NOTE — Progress Notes (Signed)
[facility] OFFICE PROGRESS NOTE  [name], NP [address] [place] [address]  DIAGNOSIS: Stage IV (T3, N0, M1 a) non-small cell lung cancer, adenocarcinoma presented with large left lower lobe lung mass in addition to another lingual mass and malignant left pleural effusion diagnosed in September 2021  Biomarker Findings Tumor Mutational Burden - 13 Muts/Mb Microsatellite status - MS-Stable Genomic Findings For a complete list of the genes assayed, please refer to the Appendix. KRAS G12C PRDM1 D253f*46 TERT promoter -146C>T TP53 V157F 7 Disease relevant genes with no reportable alterations: ALK, BRAF, EGFR, ERBB2, MET, RET, ROS1   PDL1 Expression: 95%  PRIOR THERAPY: None  CURRENT THERAPY: Libtayo (Cempilimab) 350 mg IV every 3 weeks. First dose January 15, 2020.Status post 3 cycles.  [name] 65y.o. female returns to the clinic today for follow-up visit.  The patient is feeling well today without any concerning complaints.  The patient has a history of malignant pleural effusions which has been drained 2x.  Her most recent thoracentesis was on 02/08/2020 which yielded 1.2 mL of fluid.  She was then seen in the clinic on 02/26/2020 and exam noted dullness to percussion the left lower lobe.  She was referred to cardiothoracic surgery for consideration of a Pleurx catheter.  She saw Dr. HRoxan Hockeyon 03/04/20.  Interestingly, Interestingly her chest x-ray today shows some slight improvement from her most recent postthoracentesis film of 02/08/2020. She is not presently very symptomatic. Xray also suggested possible loculation. They decided to wait until her restaging CT scan on 03/15/20 before deciding to proceed with pleurex placement.   The patient continues to note significant improvement in her shortness of breath. She states that she coughs "every once in awhile". She states the chest pain she used to get has resolved.  She denies hemoptysis. Denies fevers, chills, night sweats, or weight loss. Denies nausea, vomiting, diarrhea, or constipation. Denies rashes or skin changes except for some itching for which she uses lotion. Denies headaches or visual changes. She recently had a restaging CT scan performed. She is here for evaluation and to review her scan results before starting cycle #4.    MEDICAL HISTORY: Past Medical History:  Diagnosis Date   Hyperlipidemia    Hypertension    Pleural effusion    Pneumonia    Stage IV adenocarcinoma of lung, left (HCC)     ALLERGIES:  is allergic to ace inhibitors.  MEDICATIONS:  Current Outpatient Medications  Medication Sig Dispense Refill   cholecalciferol (VITAMIN D3) 25 MCG (1000 UNIT) tablet Take 2,000 Units by mouth daily.     losartan-hydrochlorothiazide (HYZAAR) 100-12.5 MG tablet Take 1 tablet by mouth daily.     metFORMIN (GLUCOPHAGE) 500 MG tablet Take 500 mg by mouth 2 (two) times daily with a meal.     pravastatin (PRAVACHOL) 40 MG tablet Take 40 mg by mouth daily.     sertraline (ZOLOFT) 50 MG tablet Take 75 mg by mouth daily.     prochlorperazine (COMPAZINE) 10 MG tablet Take 1 tablet (10 mg total) by mouth every 6 (six) hours as needed for nausea or vomiting. (Patient not taking: Reported on 03/18/2020) 30 tablet 0   No current facility-administered medications for this visit.    SURGICAL HISTORY:  Past Surgical History:  Procedure Laterality Date   NEPHRECTOMY Left     REVIEW OF SYSTEMS:   Review of Systems  Constitutional: Negative for appetite change, chills, fatigue, fever  and unexpected weight change.  HENT: Negative for mouth sores, nosebleeds, sore throat and trouble swallowing.   Eyes: Negative for eye problems and icterus.  Respiratory: Positive for improved shortness of breath with exertion and mild cough (improved). Negative for hemoptysis and wheezing.   Cardiovascular: Negative for chest pain and leg swelling.   Gastrointestinal: Negative for abdominal pain, constipation, diarrhea, nausea and vomiting.  Genitourinary: Negative for bladder incontinence, difficulty urinating, dysuria, frequency and hematuria.   Musculoskeletal: Negative for back pain, gait problem, neck pain and neck stiffness.  Skin: Negative for itching and rash.  Neurological: Negative for dizziness, extremity weakness, gait problem, headaches, light-headedness and seizures.  Hematological: Negative for adenopathy. Does not bruise/bleed easily.  Psychiatric/Behavioral: Negative for confusion, depression and sleep disturbance. The patient is not nervous/anxious.     PHYSICAL EXAMINATION:  Blood pressure 120/66, pulse 79, temperature 98 F (36.7 C), temperature source Tympanic, resp. rate 12, height 5' 4.5" (1.638 m), weight 166 lb (75.3 kg), SpO2 98 %.  ECOG PERFORMANCE STATUS: 1 - Symptomatic but completely ambulatory  Physical Exam  Constitutional: Oriented to person, place, and time and well-developed, well-nourished, and in no distress. HENT:  Head: Normocephalic and atraumatic.  Mouth/Throat: Oropharynx is clear and moist. No oropharyngeal exudate.  Eyes: Conjunctivae are normal. Right eye exhibits no discharge. Left eye exhibits no discharge. No scleral icterus.  Neck: Normal range of motion. Neck supple.  Cardiovascular: Normal rate, regular rhythm, normal heart sounds and intact distal pulses.   Pulmonary/Chest: Effort normal. Positive for decreased breath sounds in left lower lung field. No respiratory distress. No wheezes. No rales.  Abdominal: Soft. Bowel sounds are normal. Exhibits no distension and no mass. There is no tenderness.  Musculoskeletal: Normal range of motion. Exhibits no edema.  Lymphadenopathy:    No cervical adenopathy.  Neurological: Alert and oriented to person, place, and time. Exhibits normal muscle tone. Gait normal. Coordination normal.  Skin: Skin is warm and dry. No rash noted. Not  diaphoretic. No erythema. No pallor.  Psychiatric: Mood, memory and judgment normal.  Vitals reviewed.  LABORATORY DATA: Lab Results  Component Value Date   WBC 5.1 03/18/2020   HGB 11.2 (L) 03/18/2020   HCT 34.1 (L) 03/18/2020   MCV 86.8 03/18/2020   PLT 284 03/18/2020      Chemistry      Component Value Date/Time   NA 141 03/18/2020 0956   K 3.7 03/18/2020 0956   CL 105 03/18/2020 0956   CO2 25 03/18/2020 0956   BUN 26 (H) 03/18/2020 0956   CREATININE 0.97 03/18/2020 0956      Component Value Date/Time   CALCIUM 9.9 03/18/2020 0956   ALKPHOS 51 03/18/2020 0956   AST 12 (L) 03/18/2020 0956   ALT 11 03/18/2020 0956   BILITOT 0.5 03/18/2020 0956       RADIOGRAPHIC STUDIES:  DG Chest 2 View  Result Date: 03/04/2020 CLINICAL DATA:  Pleural effusion EXAM: CHEST - 2 VIEW COMPARISON:  February 08, 2020 FINDINGS: Large at least partially loculated pleural effusion on the left, slightly smaller compared to prior study. Consolidation in portions of the left lower lobe. Right lung clear. Heart size and pulmonary vascularity are normal. No adenopathy. No bone lesions. IMPRESSION: Apparent sizable least partially loculated left pleural effusion posteriorly with consolidation throughout portions of the left lower lobe. Slightly less pleural effusion appreciable compared to most recent study. Right lung clear. Heart size within normal limits. Electronically Signed   By: Lowella Grip III M.D.  On: 03/04/2020 13:56   CT Chest W Contrast  Result Date: 03/15/2020 CLINICAL DATA:  Follow-up non-small cell lung carcinoma. Ongoing immunotherapy. EXAM: CT CHEST, ABDOMEN, AND PELVIS WITH CONTRAST TECHNIQUE: Multidetector CT imaging of the chest, abdomen and pelvis was performed following the standard protocol during bolus administration of intravenous contrast. CONTRAST:  1108m OMNIPAQUE IOHEXOL 300 MG/ML  SOLN COMPARISON:  PET-CT on 12/22/2019 FINDINGS: CT CHEST FINDINGS Cardiovascular: No  acute findings. Aortic and coronary atherosclerotic calcification noted. Mediastinum/Lymph Nodes: 11 mm lymph node in the retrosternal region abutting the pericardium is unchanged since previous study. No other pathologically enlarged lymph nodes or masses identified. Lungs/Pleura: Diffuse left lung volume loss is again seen. A small to moderate left pleural effusion is seen but has decreased in size since previous study. Mild diffuse pleural enhancement is again noted, without evidence of focal mass. Decreased left lower lobe atelectasis also noted. No definite left lung mass visualized. Two tiny 2-3 mm pulmonary nodules are seen in the right upper lobe on image 49/7, which were not seen on PET-CT but may be due to greater slice thickness and motion on this exam. Musculoskeletal:  No suspicious bone lesions identified. CT ABDOMEN AND PELVIS FINDINGS Hepatobiliary: No masses identified. Gallbladder is unremarkable. No evidence of biliary ductal dilatation. Pancreas:  No mass or inflammatory changes. Spleen:  Within normal limits in size and appearance. Adrenals/Urinary tract: Prior left nephrectomy again noted. Right kidney is normal in appearance. No evidence of ureteral calculi or hydronephrosis. Stomach/Bowel: Small hiatal hernia again seen. No evidence of obstruction, inflammatory process, or abnormal fluid collections. Normal appendix visualized. Diverticulosis is seen mainly involving the sigmoid colon, however there is no evidence of diverticulitis. Vascular/Lymphatic: No pathologically enlarged lymph nodes identified. No abdominal aortic aneurysm. Aortic atherosclerotic calcification noted. Reproductive:  No mass or other significant abnormality identified. Other:  None. Musculoskeletal:  No suspicious bone lesions identified. IMPRESSION: Decreased size of small to moderate left pleural effusion and left lower lobe atelectasis. Stable 11 mm retrosternal mediastinal lymph node. Two tiny indeterminate right  upper lobe pulmonary nodules. These were not seen on previous PET-CT but this may be due to greater slice thickness and motion on the prior PET-CT. Continued attention on follow-up imaging recommended. No evidence of abdominal or pelvic metastatic disease. Colonic diverticulosis, without radiographic evidence of diverticulitis. Small hiatal hernia. Aortic and coronary atherosclerotic calcification noted. Electronically Signed   By: JMarlaine HindM.D.   On: 03/15/2020 16:37   CT Abdomen Pelvis W Contrast  Result Date: 03/15/2020 CLINICAL DATA:  Follow-up non-small cell lung carcinoma. Ongoing immunotherapy. EXAM: CT CHEST, ABDOMEN, AND PELVIS WITH CONTRAST TECHNIQUE: Multidetector CT imaging of the chest, abdomen and pelvis was performed following the standard protocol during bolus administration of intravenous contrast. CONTRAST:  1067mOMNIPAQUE IOHEXOL 300 MG/ML  SOLN COMPARISON:  PET-CT on 12/22/2019 FINDINGS: CT CHEST FINDINGS Cardiovascular: No acute findings. Aortic and coronary atherosclerotic calcification noted. Mediastinum/Lymph Nodes: 11 mm lymph node in the retrosternal region abutting the pericardium is unchanged since previous study. No other pathologically enlarged lymph nodes or masses identified. Lungs/Pleura: Diffuse left lung volume loss is again seen. A small to moderate left pleural effusion is seen but has decreased in size since previous study. Mild diffuse pleural enhancement is again noted, without evidence of focal mass. Decreased left lower lobe atelectasis also noted. No definite left lung mass visualized. Two tiny 2-3 mm pulmonary nodules are seen in the right upper lobe on image 49/7, which were not seen on PET-CT  but may be due to greater slice thickness and motion on this exam. Musculoskeletal:  No suspicious bone lesions identified. CT ABDOMEN AND PELVIS FINDINGS Hepatobiliary: No masses identified. Gallbladder is unremarkable. No evidence of biliary ductal dilatation. Pancreas:   No mass or inflammatory changes. Spleen:  Within normal limits in size and appearance. Adrenals/Urinary tract: Prior left nephrectomy again noted. Right kidney is normal in appearance. No evidence of ureteral calculi or hydronephrosis. Stomach/Bowel: Small hiatal hernia again seen. No evidence of obstruction, inflammatory process, or abnormal fluid collections. Normal appendix visualized. Diverticulosis is seen mainly involving the sigmoid colon, however there is no evidence of diverticulitis. Vascular/Lymphatic: No pathologically enlarged lymph nodes identified. No abdominal aortic aneurysm. Aortic atherosclerotic calcification noted. Reproductive:  No mass or other significant abnormality identified. Other:  None. Musculoskeletal:  No suspicious bone lesions identified. IMPRESSION: Decreased size of small to moderate left pleural effusion and left lower lobe atelectasis. Stable 11 mm retrosternal mediastinal lymph node. Two tiny indeterminate right upper lobe pulmonary nodules. These were not seen on previous PET-CT but this may be due to greater slice thickness and motion on the prior PET-CT. Continued attention on follow-up imaging recommended. No evidence of abdominal or pelvic metastatic disease. Colonic diverticulosis, without radiographic evidence of diverticulitis. Small hiatal hernia. Aortic and coronary atherosclerotic calcification noted. Electronically Signed   By: Marlaine Hind M.D.   On: 03/15/2020 16:37     ASSESSMENT/PLAN:  This is a very pleasant 65 year old Caucasian female recently diagnosed with stage IV (T3, N0, M1 A) non-small cell lung cancer, adenocarcinoma.  She presented with a large left lower lobe lung mass in addition to another lingular mass and a malignant left pleural effusion.  She was diagnosed in September 2021.  Her molecular studies show that she has a PD-L1 expression at 95%.  The patient also has a KRAS G12 C mutation which will be an option in the second line  setting.  The patient is currently undergoing treatment with single agent Libtayo (Cempilimab) 350 mg IV every 3 weeks for up to 2 years unless the patient has evidence for disease progression or unacceptable toxicity.  She is status post 3 cycles and tolerated it well without any concerning adverse side effects.   The patient recently had a restaging CT scan of the chest, abdomen, and pelvis performed. Dr. Julien Nordmann personally and independently reviewed the scan and discussed the results with the patient today. The scan showed no evidence for disease progression. Dr. Julien Nordmann recommends that she continue on the same treatment at the same dose.  She will proceed with cycle #4 today as scheduled.   She is not very symptomatic with her pleural effusion at this time. The scan noted that the effusion has decreased in size to be small-moderate. She will call if she becomes more symptomatic.   We will see her back for a follow up visit in 3 weeks for evaluation before starting cycle #5.   The patient was advised to call immediately if she has any concerning symptoms in the interval. The patient voices understanding of current disease status and treatment options and is in agreement with the current care plan. All questions were answered. The patient knows to call the clinic with any problems, questions or concerns. We can certainly see the patient much sooner if necessary   No orders of the defined types were placed in this encounter.    A time of 20-29 minutes was spent with the patient for this encounter.   Deepika Decatur  L Niomie Englert, PA-C 03/18/20  ADDENDUM: Hematology/Oncology Attending: I had a face-to-face encounter with the patient today.  I recommended her care plan.  This is a very pleasant 65 years old white female diagnosed with stage IV non-small cell lung cancer, adenocarcinoma with PD-L1 expression of 95% and K-ras G12C mutation. The patient is currently undergoing treatment with  immunotherapy with Libtayo (Cempilimab) 350 mg IV every 3 weeks status post 3 cycles of this treatment.  The patient has been tolerating this treatment well with no concerning adverse effects. She had recurrent left pleural effusion and she was seen by Dr. Roxan Hockey recently but there was not enough fluid for drainage and he recommended continuous observation for now. She had repeat CT scan of the chest, abdomen pelvis performed recently.  I personally and independently reviewed the scan images and discussed the results with the patient and her husband. Her scan showed no concerning findings for disease progression and that showed stable to mild improvement of her disease. I recommended for the patient to continue her current treatment with Libtayo (Cempilimab) with the same dose and schedule. She will come back for follow-up visit in 3 weeks for evaluation before starting cycle #5. If the patient continues to have worsening left pleural effusion, we will refer her back to Dr. Roxan Hockey for consideration of Pleurx catheter placement. The patient was advised to call immediately if she has any other concerning symptoms in the interval.  The total time spent in the appointment was 35 minutes.

## 2020-03-15 ENCOUNTER — Other Ambulatory Visit: Payer: Self-pay

## 2020-03-15 ENCOUNTER — Ambulatory Visit (HOSPITAL_COMMUNITY)
Admission: RE | Admit: 2020-03-15 | Discharge: 2020-03-15 | Disposition: A | Discharge status: Home / Self Care | Payer: Medicare Other | Source: Ambulatory Visit | Attending: Internal Medicine | Admitting: Internal Medicine

## 2020-03-15 DIAGNOSIS — C3490 Malignant neoplasm of unspecified part of unspecified bronchus or lung: Secondary | ICD-10-CM | POA: Insufficient documentation

## 2020-03-15 MED ORDER — IOHEXOL 300 MG/ML  SOLN
100.0000 mL | Freq: Once | INTRAMUSCULAR | Status: AC | PRN
  Administered 2020-03-15: 100 mL via INTRAVENOUS

## 2020-03-18 ENCOUNTER — Inpatient Hospital Stay: Payer: Medicare Other

## 2020-03-18 ENCOUNTER — Inpatient Hospital Stay: Payer: Medicare Other | Attending: Internal Medicine | Admitting: Physician Assistant

## 2020-03-18 ENCOUNTER — Encounter: Payer: Self-pay | Admitting: Physician Assistant

## 2020-03-18 ENCOUNTER — Other Ambulatory Visit: Payer: Self-pay

## 2020-03-18 VITALS — BP 120/66 | HR 79 | Temp 98.0°F | Resp 12 | Ht 64.5 in | Wt 166.0 lb

## 2020-03-18 DIAGNOSIS — Z79899 Other long term (current) drug therapy: Secondary | ICD-10-CM | POA: Diagnosis not present

## 2020-03-18 DIAGNOSIS — I10 Essential (primary) hypertension: Secondary | ICD-10-CM | POA: Insufficient documentation

## 2020-03-18 DIAGNOSIS — J910 Malignant pleural effusion: Secondary | ICD-10-CM | POA: Insufficient documentation

## 2020-03-18 DIAGNOSIS — Z5112 Encounter for antineoplastic immunotherapy: Secondary | ICD-10-CM | POA: Diagnosis present

## 2020-03-18 DIAGNOSIS — C3492 Malignant neoplasm of unspecified part of left bronchus or lung: Secondary | ICD-10-CM

## 2020-03-18 DIAGNOSIS — C3432 Malignant neoplasm of lower lobe, left bronchus or lung: Secondary | ICD-10-CM | POA: Diagnosis present

## 2020-03-18 DIAGNOSIS — E785 Hyperlipidemia, unspecified: Secondary | ICD-10-CM | POA: Insufficient documentation

## 2020-03-18 DIAGNOSIS — R5382 Chronic fatigue, unspecified: Secondary | ICD-10-CM

## 2020-03-18 DIAGNOSIS — Z905 Acquired absence of kidney: Secondary | ICD-10-CM | POA: Diagnosis not present

## 2020-03-18 DIAGNOSIS — Z7984 Long term (current) use of oral hypoglycemic drugs: Secondary | ICD-10-CM | POA: Insufficient documentation

## 2020-03-18 LAB — CBC WITH DIFFERENTIAL (CANCER CENTER ONLY)
Abs Immature Granulocytes: 0.01 10*3/uL (ref 0.00–0.07)
Basophils Absolute: 0.1 10*3/uL (ref 0.0–0.1)
Basophils Relative: 1 %
Eosinophils Absolute: 0.3 10*3/uL (ref 0.0–0.5)
Eosinophils Relative: 5 %
HCT: 34.1 % — ABNORMAL LOW (ref 36.0–46.0)
Hemoglobin: 11.2 g/dL — ABNORMAL LOW (ref 12.0–15.0)
Immature Granulocytes: 0 %
Lymphocytes Relative: 19 %
Lymphs Abs: 1 10*3/uL (ref 0.7–4.0)
MCH: 28.5 pg (ref 26.0–34.0)
MCHC: 32.8 g/dL (ref 30.0–36.0)
MCV: 86.8 fL (ref 80.0–100.0)
Monocytes Absolute: 0.4 10*3/uL (ref 0.1–1.0)
Monocytes Relative: 9 %
Neutro Abs: 3.4 10*3/uL (ref 1.7–7.7)
Neutrophils Relative %: 66 %
Platelet Count: 284 10*3/uL (ref 150–400)
RBC: 3.93 MIL/uL (ref 3.87–5.11)
RDW: 12.6 % (ref 11.5–15.5)
WBC Count: 5.1 10*3/uL (ref 4.0–10.5)
nRBC: 0 % (ref 0.0–0.2)

## 2020-03-18 LAB — CMP (CANCER CENTER ONLY)
ALT: 11 U/L (ref 0–44)
AST: 12 U/L — ABNORMAL LOW (ref 15–41)
Albumin: 3.5 g/dL (ref 3.5–5.0)
Alkaline Phosphatase: 51 U/L (ref 38–126)
Anion gap: 11 (ref 5–15)
BUN: 26 mg/dL — ABNORMAL HIGH (ref 8–23)
CO2: 25 mmol/L (ref 22–32)
Calcium: 9.9 mg/dL (ref 8.9–10.3)
Chloride: 105 mmol/L (ref 98–111)
Creatinine: 0.97 mg/dL (ref 0.44–1.00)
GFR, Estimated: >60 mL/min (ref >60)
Glucose, Bld: 133 mg/dL — ABNORMAL HIGH (ref 70–99)
Potassium: 3.7 mmol/L (ref 3.5–5.1)
Sodium: 141 mmol/L (ref 135–145)
Total Bilirubin: 0.5 mg/dL (ref 0.3–1.2)
Total Protein: 7.3 g/dL (ref 6.5–8.1)

## 2020-03-18 LAB — TSH: TSH: <0.08 u[IU]/mL — ABNORMAL LOW (ref 0.308–3.960)

## 2020-03-18 MED ORDER — SODIUM CHLORIDE 0.9 % IV SOLN
Freq: Once | INTRAVENOUS | Status: AC
  Filled 2020-03-18: qty 250

## 2020-03-18 MED ORDER — SODIUM CHLORIDE 0.9 % IV SOLN
350.0000 mg | Freq: Once | INTRAVENOUS | Status: AC
  Administered 2020-03-18: 350 mg via INTRAVENOUS
  Filled 2020-03-18: qty 7

## 2020-03-18 NOTE — Patient Instructions (Signed)
[facility] Discharge Instructions for Patients Receiving Chemotherapy  Today you received the following chemotherapy agents Cemiplimab-rwlc (LIBTAYO).  To help prevent nausea and vomiting after your treatment, we encourage you to take your nausea medication as prescribed.   If you develop nausea and vomiting that is not controlled by your nausea medication, call the clinic.   BELOW ARE SYMPTOMS THAT SHOULD BE REPORTED IMMEDIATELY:  *FEVER GREATER THAN 100.5 F  *CHILLS WITH OR WITHOUT FEVER  NAUSEA AND VOMITING THAT IS NOT CONTROLLED WITH YOUR NAUSEA MEDICATION  *UNUSUAL SHORTNESS OF BREATH  *UNUSUAL BRUISING OR BLEEDING  TENDERNESS IN MOUTH AND THROAT WITH OR WITHOUT PRESENCE OF ULCERS  *URINARY PROBLEMS  *BOWEL PROBLEMS  UNUSUAL RASH Items with * indicate a potential emergency and should be followed up as soon as possible.  Feel free to call the clinic should you have any questions or concerns. The clinic phone number is [phone].  Please show the [facility] at check-in to the Emergency Department and triage nurse.

## 2020-03-19 ENCOUNTER — Telehealth: Payer: Self-pay | Admitting: Physician Assistant

## 2020-03-19 ENCOUNTER — Ambulatory Visit: Payer: Medicare Other | Admitting: Thoracic Surgery (Cardiothoracic Vascular Surgery)

## 2020-03-19 NOTE — Telephone Encounter (Signed)
Scheduled appts per 1/10 los. Pt to get updated appt calendar at next visit per appt notes.

## 2020-04-08 ENCOUNTER — Inpatient Hospital Stay: Payer: Medicare Other

## 2020-04-08 ENCOUNTER — Encounter: Payer: Self-pay | Admitting: Internal Medicine

## 2020-04-08 ENCOUNTER — Inpatient Hospital Stay (HOSPITAL_BASED_OUTPATIENT_CLINIC_OR_DEPARTMENT_OTHER): Payer: Medicare Other | Admitting: Internal Medicine

## 2020-04-08 ENCOUNTER — Other Ambulatory Visit: Payer: Self-pay

## 2020-04-08 VITALS — BP 131/57 | HR 79 | Temp 97.8°F | Resp 14 | Ht 64.5 in | Wt 166.6 lb

## 2020-04-08 DIAGNOSIS — C3492 Malignant neoplasm of unspecified part of left bronchus or lung: Secondary | ICD-10-CM

## 2020-04-08 DIAGNOSIS — R5382 Chronic fatigue, unspecified: Secondary | ICD-10-CM

## 2020-04-08 DIAGNOSIS — Z5112 Encounter for antineoplastic immunotherapy: Secondary | ICD-10-CM

## 2020-04-08 DIAGNOSIS — J90 Pleural effusion, not elsewhere classified: Secondary | ICD-10-CM | POA: Diagnosis not present

## 2020-04-08 LAB — CBC WITH DIFFERENTIAL (CANCER CENTER ONLY)
Abs Immature Granulocytes: 0.01 10*3/uL (ref 0.00–0.07)
Basophils Absolute: 0.1 10*3/uL (ref 0.0–0.1)
Basophils Relative: 1 %
Eosinophils Absolute: 0.3 10*3/uL (ref 0.0–0.5)
Eosinophils Relative: 6 %
HCT: 35.3 % — ABNORMAL LOW (ref 36.0–46.0)
Hemoglobin: 11.2 g/dL — ABNORMAL LOW (ref 12.0–15.0)
Immature Granulocytes: 0 %
Lymphocytes Relative: 19 %
Lymphs Abs: 0.9 10*3/uL (ref 0.7–4.0)
MCH: 27.9 pg (ref 26.0–34.0)
MCHC: 31.7 g/dL (ref 30.0–36.0)
MCV: 87.8 fL (ref 80.0–100.0)
Monocytes Absolute: 0.5 10*3/uL (ref 0.1–1.0)
Monocytes Relative: 9 %
Neutro Abs: 3.3 10*3/uL (ref 1.7–7.7)
Neutrophils Relative %: 65 %
Platelet Count: 246 10*3/uL (ref 150–400)
RBC: 4.02 MIL/uL (ref 3.87–5.11)
RDW: 12.8 % (ref 11.5–15.5)
WBC Count: 5 10*3/uL (ref 4.0–10.5)
nRBC: 0 % (ref 0.0–0.2)

## 2020-04-08 LAB — CMP (CANCER CENTER ONLY)
ALT: 13 U/L (ref 0–44)
AST: 11 U/L — ABNORMAL LOW (ref 15–41)
Albumin: 3.6 g/dL (ref 3.5–5.0)
Alkaline Phosphatase: 57 U/L (ref 38–126)
Anion gap: 11 (ref 5–15)
BUN: 23 mg/dL (ref 8–23)
CO2: 26 mmol/L (ref 22–32)
Calcium: 9.9 mg/dL (ref 8.9–10.3)
Chloride: 107 mmol/L (ref 98–111)
Creatinine: 0.85 mg/dL (ref 0.44–1.00)
GFR, Estimated: >60 mL/min (ref >60)
Glucose, Bld: 110 mg/dL — ABNORMAL HIGH (ref 70–99)
Potassium: 4 mmol/L (ref 3.5–5.1)
Sodium: 144 mmol/L (ref 135–145)
Total Bilirubin: 0.2 mg/dL — ABNORMAL LOW (ref 0.3–1.2)
Total Protein: 7.5 g/dL (ref 6.5–8.1)

## 2020-04-08 LAB — TSH: TSH: <0.08 u[IU]/mL — ABNORMAL LOW (ref 0.308–3.960)

## 2020-04-08 MED ORDER — SODIUM CHLORIDE 0.9 % IV SOLN
Freq: Once | INTRAVENOUS | Status: AC
  Filled 2020-04-08: qty 250

## 2020-04-08 MED ORDER — SODIUM CHLORIDE 0.9 % IV SOLN
350.0000 mg | Freq: Once | INTRAVENOUS | Status: AC
  Administered 2020-04-08: 350 mg via INTRAVENOUS
  Filled 2020-04-08: qty 7

## 2020-04-08 NOTE — Patient Instructions (Signed)
[facility] Discharge Instructions for Patients Receiving Chemotherapy  Today you received the following chemotherapy agents Libtayo  To help prevent nausea and vomiting after your treatment, we encourage you to take your nausea medication as directed.   If you develop nausea and vomiting that is not controlled by your nausea medication, call the clinic.   BELOW ARE SYMPTOMS THAT SHOULD BE REPORTED IMMEDIATELY:  *FEVER GREATER THAN 100.5 F  *CHILLS WITH OR WITHOUT FEVER  NAUSEA AND VOMITING THAT IS NOT CONTROLLED WITH YOUR NAUSEA MEDICATION  *UNUSUAL SHORTNESS OF BREATH  *UNUSUAL BRUISING OR BLEEDING  TENDERNESS IN MOUTH AND THROAT WITH OR WITHOUT PRESENCE OF ULCERS  *URINARY PROBLEMS  *BOWEL PROBLEMS  UNUSUAL RASH Items with * indicate a potential emergency and should be followed up as soon as possible.  Feel free to call the clinic should you have any questions or concerns. The clinic phone number is [phone].  Please show the [facility] at check-in to the Emergency Department and triage nurse.

## 2020-04-08 NOTE — Progress Notes (Signed)
[place] Telephone:([phone]) [phone]   Fax:(336) [phone]  OFFICE PROGRESS NOTE  [name], NP [place] [address]  DIAGNOSIS: stage IV (T3, N0, M1 a) non-small cell lung cancer, adenocarcinoma presented with large left lower lobe lung mass in addition to another lingual mass and malignant left pleural effusion diagnosed in September 2021  Biomarker Findings Tumor Mutational Burden - 13 Muts/Mb Microsatellite status - MS-Stable Genomic Findings For a complete list of the genes assayed, please refer to the Appendix. KRAS G12C PRDM1 D262f*46 TERT promoter -146C>T TP53 V157F 7 Disease relevant genes with no reportable alterations: ALK, BRAF, EGFR, ERBB2, MET, RET, ROS1   PDL1 Expression: 95%  PRIOR THERAPY: None.  CURRENT THERAPY: Libtayo (Cempilimab) 350 mg IV every 3 weeks.  First dose January 15, 2020.  Status post 4 cycles.  [name] 65y.o. female returns to the clinic today for follow-up visit accompanied by her husband. The patient is feeling fine today with no concerning complaints except for mild itching. She continues to tolerate her treatment with Libtayo (Cempilimab) fairly well. She has no nausea, vomiting, diarrhea or constipation. She denied having any chest pain, shortness of breath, cough or hemoptysis. She has no fever or chills. She is here today for evaluation before starting cycle #5 of her treatment.    MEDICAL HISTORY: Past Medical History:  Diagnosis Date  . Hyperlipidemia   . Hypertension   . Pleural effusion   . Pneumonia   . Stage IV adenocarcinoma of lung, left (HCC)     ALLERGIES:  is allergic to ace inhibitors.  MEDICATIONS:  Current Outpatient Medications  Medication Sig Dispense Refill  . cholecalciferol (VITAMIN D3) 25 MCG (1000 UNIT) tablet Take 2,000 Units by mouth daily.    .Marland Kitchenlosartan-hydrochlorothiazide (HYZAAR) 100-12.5 MG tablet Take 1 tablet by mouth daily.     . metFORMIN (GLUCOPHAGE) 500 MG tablet Take 500 mg by mouth 2 (two) times daily with a meal.    . pravastatin (PRAVACHOL) 40 MG tablet Take 40 mg by mouth daily.    . prochlorperazine (COMPAZINE) 10 MG tablet Take 1 tablet (10 mg total) by mouth every 6 (six) hours as needed for nausea or vomiting. (Patient not taking: Reported on 03/18/2020) 30 tablet 0  . sertraline (ZOLOFT) 50 MG tablet Take 75 mg by mouth daily.     No current facility-administered medications for this visit.    SURGICAL HISTORY:  Past Surgical History:  Procedure Laterality Date  . NEPHRECTOMY Left     REVIEW OF SYSTEMS:  A comprehensive review of systems was negative except for: Integument/breast: positive for pruritus   PHYSICAL EXAMINATION: General appearance: alert, cooperative and no distress Head: Normocephalic, without obvious abnormality, atraumatic Neck: no adenopathy, no JVD, supple, symmetrical, trachea midline and thyroid not enlarged, symmetric, no tenderness/mass/nodules Lymph nodes: Cervical, supraclavicular, and axillary nodes normal. Resp: clear to auscultation bilaterally Back: symmetric, no curvature. ROM normal. No CVA tenderness. Cardio: regular rate and rhythm, S1, S2 normal, no murmur, click, rub or gallop GI: soft, non-tender; bowel sounds normal; no masses,  no organomegaly Extremities: extremities normal, atraumatic, no cyanosis or edema  ECOG PERFORMANCE STATUS: 1 - Symptomatic but completely ambulatory  Blood pressure (!) 131/57, pulse 79, temperature 97.8 F (36.6 C), temperature source Tympanic, resp. rate 14, height 5' 4.5" (1.638 m), weight 166 lb 9.6 oz (75.6 kg), SpO2 99 %.  LABORATORY DATA: Lab Results  Component Value  Date   WBC 5.0 04/08/2020   HGB 11.2 (L) 04/08/2020   HCT 35.3 (L) 04/08/2020   MCV 87.8 04/08/2020   PLT 246 04/08/2020      Chemistry      Component Value Date/Time   NA 141 03/18/2020 0956   K 3.7 03/18/2020 0956   CL 105 03/18/2020 0956   CO2  25 03/18/2020 0956   BUN 26 (H) 03/18/2020 0956   CREATININE 0.97 03/18/2020 0956      Component Value Date/Time   CALCIUM 9.9 03/18/2020 0956   ALKPHOS 51 03/18/2020 0956   AST 12 (L) 03/18/2020 0956   ALT 11 03/18/2020 0956   BILITOT 0.5 03/18/2020 0956       RADIOGRAPHIC STUDIES: CT Chest W Contrast  Result Date: 03/15/2020 CLINICAL DATA:  Follow-up non-small cell lung carcinoma. Ongoing immunotherapy. EXAM: CT CHEST, ABDOMEN, AND PELVIS WITH CONTRAST TECHNIQUE: Multidetector CT imaging of the chest, abdomen and pelvis was performed following the standard protocol during bolus administration of intravenous contrast. CONTRAST:  132m OMNIPAQUE IOHEXOL 300 MG/ML  SOLN COMPARISON:  PET-CT on 12/22/2019 FINDINGS: CT CHEST FINDINGS Cardiovascular: No acute findings. Aortic and coronary atherosclerotic calcification noted. Mediastinum/Lymph Nodes: 11 mm lymph node in the retrosternal region abutting the pericardium is unchanged since previous study. No other pathologically enlarged lymph nodes or masses identified. Lungs/Pleura: Diffuse left lung volume loss is again seen. A small to moderate left pleural effusion is seen but has decreased in size since previous study. Mild diffuse pleural enhancement is again noted, without evidence of focal mass. Decreased left lower lobe atelectasis also noted. No definite left lung mass visualized. Two tiny 2-3 mm pulmonary nodules are seen in the right upper lobe on image 49/7, which were not seen on PET-CT but may be due to greater slice thickness and motion on this exam. Musculoskeletal:  No suspicious bone lesions identified. CT ABDOMEN AND PELVIS FINDINGS Hepatobiliary: No masses identified. Gallbladder is unremarkable. No evidence of biliary ductal dilatation. Pancreas:  No mass or inflammatory changes. Spleen:  Within normal limits in size and appearance. Adrenals/Urinary tract: Prior left nephrectomy again noted. Right kidney is normal in appearance. No  evidence of ureteral calculi or hydronephrosis. Stomach/Bowel: Small hiatal hernia again seen. No evidence of obstruction, inflammatory process, or abnormal fluid collections. Normal appendix visualized. Diverticulosis is seen mainly involving the sigmoid colon, however there is no evidence of diverticulitis. Vascular/Lymphatic: No pathologically enlarged lymph nodes identified. No abdominal aortic aneurysm. Aortic atherosclerotic calcification noted. Reproductive:  No mass or other significant abnormality identified. Other:  None. Musculoskeletal:  No suspicious bone lesions identified. IMPRESSION: Decreased size of small to moderate left pleural effusion and left lower lobe atelectasis. Stable 11 mm retrosternal mediastinal lymph node. Two tiny indeterminate right upper lobe pulmonary nodules. These were not seen on previous PET-CT but this may be due to greater slice thickness and motion on the prior PET-CT. Continued attention on follow-up imaging recommended. No evidence of abdominal or pelvic metastatic disease. Colonic diverticulosis, without radiographic evidence of diverticulitis. Small hiatal hernia. Aortic and coronary atherosclerotic calcification noted. Electronically Signed   By: JMarlaine HindM.D.   On: 03/15/2020 16:37   CT Abdomen Pelvis W Contrast  Result Date: 03/15/2020 CLINICAL DATA:  Follow-up non-small cell lung carcinoma. Ongoing immunotherapy. EXAM: CT CHEST, ABDOMEN, AND PELVIS WITH CONTRAST TECHNIQUE: Multidetector CT imaging of the chest, abdomen and pelvis was performed following the standard protocol during bolus administration of intravenous contrast. CONTRAST:  1054mOMNIPAQUE IOHEXOL 300 MG/ML  SOLN COMPARISON:  PET-CT on 12/22/2019 FINDINGS: CT CHEST FINDINGS Cardiovascular: No acute findings. Aortic and coronary atherosclerotic calcification noted. Mediastinum/Lymph Nodes: 11 mm lymph node in the retrosternal region abutting the pericardium is unchanged since previous study. No  other pathologically enlarged lymph nodes or masses identified. Lungs/Pleura: Diffuse left lung volume loss is again seen. A small to moderate left pleural effusion is seen but has decreased in size since previous study. Mild diffuse pleural enhancement is again noted, without evidence of focal mass. Decreased left lower lobe atelectasis also noted. No definite left lung mass visualized. Two tiny 2-3 mm pulmonary nodules are seen in the right upper lobe on image 49/7, which were not seen on PET-CT but may be due to greater slice thickness and motion on this exam. Musculoskeletal:  No suspicious bone lesions identified. CT ABDOMEN AND PELVIS FINDINGS Hepatobiliary: No masses identified. Gallbladder is unremarkable. No evidence of biliary ductal dilatation. Pancreas:  No mass or inflammatory changes. Spleen:  Within normal limits in size and appearance. Adrenals/Urinary tract: Prior left nephrectomy again noted. Right kidney is normal in appearance. No evidence of ureteral calculi or hydronephrosis. Stomach/Bowel: Small hiatal hernia again seen. No evidence of obstruction, inflammatory process, or abnormal fluid collections. Normal appendix visualized. Diverticulosis is seen mainly involving the sigmoid colon, however there is no evidence of diverticulitis. Vascular/Lymphatic: No pathologically enlarged lymph nodes identified. No abdominal aortic aneurysm. Aortic atherosclerotic calcification noted. Reproductive:  No mass or other significant abnormality identified. Other:  None. Musculoskeletal:  No suspicious bone lesions identified. IMPRESSION: Decreased size of small to moderate left pleural effusion and left lower lobe atelectasis. Stable 11 mm retrosternal mediastinal lymph node. Two tiny indeterminate right upper lobe pulmonary nodules. These were not seen on previous PET-CT but this may be due to greater slice thickness and motion on the prior PET-CT. Continued attention on follow-up imaging recommended. No  evidence of abdominal or pelvic metastatic disease. Colonic diverticulosis, without radiographic evidence of diverticulitis. Small hiatal hernia. Aortic and coronary atherosclerotic calcification noted. Electronically Signed   By: Marlaine Hind M.D.   On: 03/15/2020 16:37    ASSESSMENT AND PLAN: This is a very pleasant 65 years old white female recently diagnosed with a stage IV (T3, N0, M1 a) non-small cell lung cancer, adenocarcinoma presented with large left lower lobe lung mass in addition to another lingular mass and malignant left pleural effusion diagnosed in September 2021.  The molecular studies showed positive PD-L1 expression of 95%.  The patient also has K-ras G12C mutation which will be an option for the second line treatment. With the high PD-L1 expression I discussed with the patient treatment with single agent Libtayo (Cempilimab) 350 mg IV every 3 weeks for up to 2 years unless the patient has evidence for disease progression or unacceptable toxicity. The patient started the first cycle of this treatment on January 15, 2020. She is status post four cycles. The patient continues to tolerate her treatment fairly well with no concerning adverse effects except for mild itching. I recommended for her to proceed with cycle #5 today as planned. I will see her back for follow-up visit in 3 weeks for evaluation before starting cycle #6. The patient was advised to call immediately if she has any concerning symptoms in the interval. The patient voices understanding of current disease status and treatment options and is in agreement with the current care plan.  All questions were answered. The patient knows to call the clinic with any problems, questions or concerns.  We can certainly see the patient much sooner if necessary.   Disclaimer: This note was dictated with voice recognition software. Similar sounding words can inadvertently be transcribed and may not be corrected upon review.

## 2020-04-10 ENCOUNTER — Telehealth: Payer: Self-pay | Admitting: Internal Medicine

## 2020-04-10 NOTE — Telephone Encounter (Signed)
Scheduled appts per 1/31 los. Pt to get updated appt calendar at next visit per appt notes.

## 2020-04-29 ENCOUNTER — Inpatient Hospital Stay: Payer: Medicare Other

## 2020-04-29 ENCOUNTER — Encounter: Payer: Self-pay | Admitting: Internal Medicine

## 2020-04-29 ENCOUNTER — Inpatient Hospital Stay (HOSPITAL_BASED_OUTPATIENT_CLINIC_OR_DEPARTMENT_OTHER): Payer: Medicare Other | Admitting: Internal Medicine

## 2020-04-29 ENCOUNTER — Inpatient Hospital Stay: Payer: Medicare Other | Attending: Internal Medicine

## 2020-04-29 ENCOUNTER — Other Ambulatory Visit: Payer: Self-pay

## 2020-04-29 VITALS — BP 113/75 | HR 68 | Temp 97.8°F | Resp 17 | Ht 64.5 in | Wt 168.6 lb

## 2020-04-29 DIAGNOSIS — C3492 Malignant neoplasm of unspecified part of left bronchus or lung: Secondary | ICD-10-CM

## 2020-04-29 DIAGNOSIS — Z79899 Other long term (current) drug therapy: Secondary | ICD-10-CM | POA: Diagnosis not present

## 2020-04-29 DIAGNOSIS — C3432 Malignant neoplasm of lower lobe, left bronchus or lung: Secondary | ICD-10-CM | POA: Diagnosis present

## 2020-04-29 DIAGNOSIS — E785 Hyperlipidemia, unspecified: Secondary | ICD-10-CM | POA: Insufficient documentation

## 2020-04-29 DIAGNOSIS — Z5112 Encounter for antineoplastic immunotherapy: Secondary | ICD-10-CM | POA: Diagnosis present

## 2020-04-29 DIAGNOSIS — C3490 Malignant neoplasm of unspecified part of unspecified bronchus or lung: Secondary | ICD-10-CM

## 2020-04-29 DIAGNOSIS — Z7984 Long term (current) use of oral hypoglycemic drugs: Secondary | ICD-10-CM | POA: Insufficient documentation

## 2020-04-29 DIAGNOSIS — J910 Malignant pleural effusion: Secondary | ICD-10-CM | POA: Insufficient documentation

## 2020-04-29 DIAGNOSIS — I10 Essential (primary) hypertension: Secondary | ICD-10-CM | POA: Insufficient documentation

## 2020-04-29 DIAGNOSIS — R5382 Chronic fatigue, unspecified: Secondary | ICD-10-CM

## 2020-04-29 DIAGNOSIS — Z905 Acquired absence of kidney: Secondary | ICD-10-CM | POA: Diagnosis not present

## 2020-04-29 LAB — CMP (CANCER CENTER ONLY)
ALT: 11 U/L (ref 0–44)
AST: 12 U/L — ABNORMAL LOW (ref 15–41)
Albumin: 3.4 g/dL — ABNORMAL LOW (ref 3.5–5.0)
Alkaline Phosphatase: 59 U/L (ref 38–126)
Anion gap: 10 (ref 5–15)
BUN: 30 mg/dL — ABNORMAL HIGH (ref 8–23)
CO2: 24 mmol/L (ref 22–32)
Calcium: 9.4 mg/dL (ref 8.9–10.3)
Chloride: 107 mmol/L (ref 98–111)
Creatinine: 0.91 mg/dL (ref 0.44–1.00)
GFR, Estimated: >60 mL/min (ref >60)
Glucose, Bld: 127 mg/dL — ABNORMAL HIGH (ref 70–99)
Potassium: 4 mmol/L (ref 3.5–5.1)
Sodium: 141 mmol/L (ref 135–145)
Total Bilirubin: 0.3 mg/dL (ref 0.3–1.2)
Total Protein: 7.2 g/dL (ref 6.5–8.1)

## 2020-04-29 LAB — CBC WITH DIFFERENTIAL (CANCER CENTER ONLY)
Abs Immature Granulocytes: 0.01 10*3/uL (ref 0.00–0.07)
Basophils Absolute: 0.1 10*3/uL (ref 0.0–0.1)
Basophils Relative: 1 %
Eosinophils Absolute: 0.3 10*3/uL (ref 0.0–0.5)
Eosinophils Relative: 7 %
HCT: 35.5 % — ABNORMAL LOW (ref 36.0–46.0)
Hemoglobin: 11.3 g/dL — ABNORMAL LOW (ref 12.0–15.0)
Immature Granulocytes: 0 %
Lymphocytes Relative: 21 %
Lymphs Abs: 0.9 10*3/uL (ref 0.7–4.0)
MCH: 27.7 pg (ref 26.0–34.0)
MCHC: 31.8 g/dL (ref 30.0–36.0)
MCV: 87 fL (ref 80.0–100.0)
Monocytes Absolute: 0.3 10*3/uL (ref 0.1–1.0)
Monocytes Relative: 7 %
Neutro Abs: 2.8 10*3/uL (ref 1.7–7.7)
Neutrophils Relative %: 64 %
Platelet Count: 260 10*3/uL (ref 150–400)
RBC: 4.08 MIL/uL (ref 3.87–5.11)
RDW: 13.2 % (ref 11.5–15.5)
WBC Count: 4.4 10*3/uL (ref 4.0–10.5)
nRBC: 0 % (ref 0.0–0.2)

## 2020-04-29 LAB — TSH: TSH: <0.08 u[IU]/mL — ABNORMAL LOW (ref 0.308–3.960)

## 2020-04-29 MED ORDER — SODIUM CHLORIDE 0.9 % IV SOLN
350.0000 mg | Freq: Once | INTRAVENOUS | Status: AC
  Administered 2020-04-29: 350 mg via INTRAVENOUS
  Filled 2020-04-29: qty 7

## 2020-04-29 MED ORDER — SODIUM CHLORIDE 0.9 % IV SOLN
Freq: Once | INTRAVENOUS | Status: AC
  Filled 2020-04-29: qty 250

## 2020-04-29 NOTE — Patient Instructions (Signed)
[facility] Discharge Instructions for Patients Receiving Chemotherapy  Today you received the following chemotherapy agents: cemiplimab.  To help prevent nausea and vomiting after your treatment, we encourage you to take your nausea medication as directed.   If you develop nausea and vomiting that is not controlled by your nausea medication, call the clinic.   BELOW ARE SYMPTOMS THAT SHOULD BE REPORTED IMMEDIATELY:  *FEVER GREATER THAN 100.5 F  *CHILLS WITH OR WITHOUT FEVER  NAUSEA AND VOMITING THAT IS NOT CONTROLLED WITH YOUR NAUSEA MEDICATION  *UNUSUAL SHORTNESS OF BREATH  *UNUSUAL BRUISING OR BLEEDING  TENDERNESS IN MOUTH AND THROAT WITH OR WITHOUT PRESENCE OF ULCERS  *URINARY PROBLEMS  *BOWEL PROBLEMS  UNUSUAL RASH Items with * indicate a potential emergency and should be followed up as soon as possible.  Feel free to call the clinic should you have any questions or concerns. The clinic phone number is [phone].  Please show the [place] [facility] at check-in to the Emergency Department and triage nurse.

## 2020-04-29 NOTE — Progress Notes (Signed)
[facility] [phone]   [phone]  OFFICE PROGRESS NOTE  Gabrielle Dandy, NP [place] [address]  DIAGNOSIS: stage IV (T3, N0, M1 a) non-small cell lung cancer, adenocarcinoma presented with large left lower lobe lung mass in addition to another lingual mass and malignant left pleural effusion diagnosed in September 2021  Biomarker Findings Tumor Mutational Burden - 13 Muts/Mb Microsatellite status - MS-Stable Genomic Findings For a complete list of the genes assayed, please refer to the Appendix. KRAS G12C PRDM1 D280f*46 TERT promoter -146C>T TP53 V157F 7 Disease relevant genes with no reportable alterations: ALK, BRAF, EGFR, ERBB2, MET, RET, ROS1   PDL1 Expression: 95%  PRIOR THERAPY: None.  CURRENT THERAPY: Libtayo (Cempilimab) 350 mg IV every 3 weeks.  First dose January 15, 2020.  Status post 5 cycles.  [name] 65y.o. female returns to the clinic today for follow-up visit accompanied by her husband.  The patient is feeling fine today with no concerning complaints.  She has no current chest pain, shortness of breath, cough or hemoptysis.  She denied having any fever or chills.  She has no nausea, vomiting, diarrhea or constipation.  She denied having any headache or visual changes.  She has no weight loss or night sweats.  She continues to tolerate her treatment with Libtayo (Cempilimab) fairly well.  The patient is here today for evaluation before starting cycle #6 of her treatment.   MEDICAL HISTORY: Past Medical History:  Diagnosis Date  . Hyperlipidemia   . Hypertension   . Pleural effusion   . Pneumonia   . Stage IV adenocarcinoma of lung, left (HCC)     ALLERGIES:  is allergic to ace inhibitors.  MEDICATIONS:  Current Outpatient Medications  Medication Sig Dispense Refill  . cholecalciferol (VITAMIN D3) 25 MCG (1000 UNIT) tablet Take 2,000 Units by mouth daily.    .Marland Kitchen losartan-hydrochlorothiazide (HYZAAR) 100-12.5 MG tablet Take 1 tablet by mouth daily.    . metFORMIN (GLUCOPHAGE) 500 MG tablet Take 500 mg by mouth 2 (two) times daily with a meal.    . pravastatin (PRAVACHOL) 40 MG tablet Take 40 mg by mouth daily.    . prochlorperazine (COMPAZINE) 10 MG tablet Take 1 tablet (10 mg total) by mouth every 6 (six) hours as needed for nausea or vomiting. (Patient not taking: Reported on 03/18/2020) 30 tablet 0  . sertraline (ZOLOFT) 50 MG tablet Take 75 mg by mouth daily.     No current facility-administered medications for this visit.    SURGICAL HISTORY:  Past Surgical History:  Procedure Laterality Date  . NEPHRECTOMY Left     REVIEW OF SYSTEMS:  A comprehensive review of systems was negative.   PHYSICAL EXAMINATION: General appearance: alert, cooperative and no distress Head: Normocephalic, without obvious abnormality, atraumatic Neck: no adenopathy, no JVD, supple, symmetrical, trachea midline and thyroid not enlarged, symmetric, no tenderness/mass/nodules Lymph nodes: Cervical, supraclavicular, and axillary nodes normal. Resp: clear to auscultation bilaterally Back: symmetric, no curvature. ROM normal. No CVA tenderness. Cardio: regular rate and rhythm, S1, S2 normal, no murmur, click, rub or gallop GI: soft, non-tender; bowel sounds normal; no masses,  no organomegaly Extremities: extremities normal, atraumatic, no cyanosis or edema  ECOG PERFORMANCE STATUS: 0 - Asymptomatic  Blood pressure 113/75, pulse 68, temperature 97.8 F (36.6 C), resp. rate 17, height 5' 4.5" (1.638 m), weight 168 lb 9.6 oz (76.5 kg), SpO2 98 %.  LABORATORY DATA: Lab Results  Component Value Date   WBC 4.4 04/29/2020   HGB 11.3 (L) 04/29/2020   HCT 35.5 (L) 04/29/2020   MCV 87.0 04/29/2020   PLT 260 04/29/2020      Chemistry      Component Value Date/Time   NA 141 04/29/2020 1012   K 4.0 04/29/2020 1012   CL 107 04/29/2020 1012   CO2 24 04/29/2020 1012    BUN 30 (H) 04/29/2020 1012   CREATININE 0.91 04/29/2020 1012      Component Value Date/Time   CALCIUM 9.4 04/29/2020 1012   ALKPHOS 59 04/29/2020 1012   AST 12 (L) 04/29/2020 1012   ALT 11 04/29/2020 1012   BILITOT 0.3 04/29/2020 1012       RADIOGRAPHIC STUDIES: No results found.  ASSESSMENT AND PLAN: This is a very pleasant 64 years old white female recently diagnosed with a stage IV (T3, N0, M1 a) non-small cell lung cancer, adenocarcinoma presented with large left lower lobe lung mass in addition to another lingular mass and malignant left pleural effusion diagnosed in September 2021.  The molecular studies showed positive PD-L1 expression of 95%.  The patient also has K-ras G12C mutation which will be an option for the second line treatment. With the high PD-L1 expression I discussed with the patient treatment with single agent Libtayo (Cempilimab) 350 mg IV every 3 weeks for up to 2 years unless the patient has evidence for disease progression or unacceptable toxicity. The patient started the first cycle of this treatment on January 15, 2020. She is status post 5 cycles.  The patient continues to tolerate this treatment well with no concerning adverse effects. I recommended for her to proceed with cycle #6 today as planned. I will see her back for follow-up visit in 3 weeks for evaluation with repeat CT scan of the chest, abdomen pelvis for restaging of her disease. She was advised to call immediately if she has any concerning symptoms in the interval. The patient voices understanding of current disease status and treatment options and is in agreement with the current care plan.  All questions were answered. The patient knows to call the clinic with any problems, questions or concerns. We can certainly see the patient much sooner if necessary.   Disclaimer: This note was dictated with voice recognition software. Similar sounding words can inadvertently be transcribed and may not be  corrected upon review.       

## 2020-05-01 ENCOUNTER — Telehealth: Payer: Self-pay | Admitting: Internal Medicine

## 2020-05-01 NOTE — Telephone Encounter (Signed)
Scheduled appts per 2/21 los. Left voicemail with next appt date/time.

## 2020-05-13 ENCOUNTER — Telehealth: Payer: Self-pay | Admitting: Medical Oncology

## 2020-05-13 NOTE — Telephone Encounter (Signed)
CT scan not scheduled . Message sent to scheduler.    She  does not need labs before her CT scan. They were drawn on 05/01/20.

## 2020-05-15 NOTE — Progress Notes (Signed)
[facility] OFFICE PROGRESS NOTE  [name], NP [address] [place] [address]  DIAGNOSIS: Stage IV (T3, N0, M1 a) non-small cell lung cancer, adenocarcinoma presented with large left lower lobe lung mass in addition to another lingual mass and malignant left pleural effusion diagnosed in September 2021  Biomarker Findings Tumor Mutational Burden - 13 Muts/Mb Microsatellite status - MS-Stable Genomic Findings For a complete list of the genes assayed, please refer to the Appendix. KRAS G12C PRDM1 D267f*46 TERT promoter -146C>T TP53 V157F 7 Disease relevant genes with no reportable alterations: ALK, BRAF, EGFR, ERBB2, MET, RET, ROS1   PDL1 Expression: 95%  PRIOR THERAPY: None  CURRENT THERAPY: Libtayo (Cempilimab) 350 mg IV every 3 weeks. First dose January 15, 2020.Status post3cycles.  INTERVAL HISTORY: Viola H [place]ivenburg 65y.o. female returns to the clinic for a follow up visit accompanied by her husband. The patient is feeling well today without any concerning complaints. The patient continues to tolerate treatment with immunotherapy with Libtayo well without any adverse side effects. Denies any fever, chills, night sweats, or weight loss. Denies any chest pain, cough, or hemoptysis. She denies any significant shortness of breath. She has had a stable pleural effusion which she is not very symptomatic from. Denies any nausea, vomiting, diarrhea, or constipation. Denies any headache or visual changes. Denies any rashes or skin changes. The patient recently had a restaging CT scan performed. The patient is here today for evaluation and to review her scan results prior to starting cycle # 7.    MEDICAL HISTORY: Past Medical History:  Diagnosis Date  . Hyperlipidemia   . Hypertension   . Pleural effusion   . Pneumonia   . Stage IV adenocarcinoma of lung, left (HCC)     ALLERGIES:  is allergic to ace inhibitors.  MEDICATIONS:  Current  Outpatient Medications  Medication Sig Dispense Refill  . cholecalciferol (VITAMIN D3) 25 MCG (1000 UNIT) tablet Take 2,000 Units by mouth daily.    .Marland Kitchenlosartan-hydrochlorothiazide (HYZAAR) 100-12.5 MG tablet Take 1 tablet by mouth daily.    . metFORMIN (GLUCOPHAGE) 500 MG tablet Take 500 mg by mouth 2 (two) times daily with a meal.    . pravastatin (PRAVACHOL) 40 MG tablet Take 40 mg by mouth daily.    . sertraline (ZOLOFT) 50 MG tablet Take 75 mg by mouth daily.    . prochlorperazine (COMPAZINE) 10 MG tablet Take 1 tablet (10 mg total) by mouth every 6 (six) hours as needed for nausea or vomiting. (Patient not taking: Reported on 05/20/2020) 30 tablet 0   No current facility-administered medications for this visit.    SURGICAL HISTORY:  Past Surgical History:  Procedure Laterality Date  . NEPHRECTOMY Left     REVIEW OF SYSTEMS:   Review of Systems  Constitutional: Negative for appetite change, chills, fatigue, fever and unexpected weight change.  HENT: Negative for mouth sores, nosebleeds, sore throat and trouble swallowing.   Eyes: Negative for eye problems and icterus.  Respiratory: Negative for cough, hemoptysis, shortness of breath and wheezing.   Cardiovascular: Negative for chest pain and leg swelling.  Gastrointestinal: Negative for abdominal pain, constipation, diarrhea, nausea and vomiting.  Genitourinary: Negative for bladder incontinence, difficulty urinating, dysuria, frequency and hematuria.   Musculoskeletal: Negative for back pain, gait problem, neck pain and neck stiffness.  Skin: Negative for itching and rash.  Neurological: Negative for dizziness, extremity weakness, gait problem, headaches, light-headedness and seizures.  Hematological: Negative for adenopathy.  Does not bruise/bleed easily.  Psychiatric/Behavioral: Negative for confusion, depression and sleep disturbance. The patient is not nervous/anxious.     PHYSICAL EXAMINATION:  Blood pressure (!) 109/59,  pulse 67, temperature 97.8 F (36.6 C), temperature source Tympanic, resp. rate 17, height 5' 4.5" (1.638 m), weight 168 lb 11.2 oz (76.5 kg), SpO2 98 %.  ECOG PERFORMANCE STATUS: 1 - Symptomatic but completely ambulatory  Physical Exam  Constitutional: Oriented to person, place, and time and well-developed, well-nourished, and in no distress. No distress.  HENT:  Head: Normocephalic and atraumatic.  Mouth/Throat: Oropharynx is clear and moist. No oropharyngeal exudate.  Eyes: Conjunctivae are normal. Right eye exhibits no discharge. Left eye exhibits no discharge. No scleral icterus.  Neck: Normal range of motion. Neck supple.  Cardiovascular: Normal rate, regular rhythm, normal heart sounds and intact distal pulses.   Pulmonary/Chest: Effort normal. Decreased breath sounds in left lower lung field. No respiratory distress. No wheezes. No rales.  Abdominal: Soft. Bowel sounds are normal. Exhibits no distension and no mass. There is no tenderness.  Musculoskeletal: Normal range of motion. Exhibits no edema.  Lymphadenopathy:    No cervical adenopathy.  Neurological: Alert and oriented to person, place, and time. Exhibits normal muscle tone. Gait normal. Coordination normal.  Skin: Skin is warm and dry. No rash noted. Not diaphoretic. No erythema. No pallor.  Psychiatric: Mood, memory and judgment normal.  Vitals reviewed.  LABORATORY DATA: Lab Results  Component Value Date   WBC 5.2 05/20/2020   HGB 10.9 (L) 05/20/2020   HCT 33.5 (L) 05/20/2020   MCV 83.5 05/20/2020   PLT 290 05/20/2020      Chemistry      Component Value Date/Time   NA 136 05/20/2020 0954   K 4.1 05/20/2020 0954   CL 104 05/20/2020 0954   CO2 23 05/20/2020 0954   BUN 26 (H) 05/20/2020 0954   CREATININE 0.97 05/20/2020 0954      Component Value Date/Time   CALCIUM 9.6 05/20/2020 0954   ALKPHOS 55 05/20/2020 0954   AST 12 (L) 05/20/2020 0954   ALT 10 05/20/2020 0954   BILITOT 0.3 05/20/2020 0954        RADIOGRAPHIC STUDIES:  CT Chest W Contrast  Result Date: 05/19/2020 CLINICAL DATA:  Restaging non-small cell lung cancer. EXAM: CT CHEST, ABDOMEN, AND PELVIS WITH CONTRAST TECHNIQUE: Multidetector CT imaging of the chest, abdomen and pelvis was performed following the standard protocol during bolus administration of intravenous contrast. CONTRAST:  125m OMNIPAQUE IOHEXOL 300 MG/ML  SOLN COMPARISON:  03/15/2020 FINDINGS: CT CHEST FINDINGS Cardiovascular: The heart is normal in size. No pericardial effusion. Stable mild tortuosity and calcification of the thoracic aorta no focal aneurysm or dissection. The branch vessels are patent. Stable ostial calcifications. Stable age advanced three-vessel coronary artery calcifications. Mediastinum/Nodes: Stable 11.5 mm substernal lymph node on image 32/2. No other mediastinal or hilar lymph nodes are identified. The esophagus is grossly normal. Stable small hiatal hernia. Lungs/Pleura: Stable loss of volume in the right hemithorax with the persistent moderate-sized complex left pleural fluid collection and associated pleural thickening and areas of subpleural atelectasis. No findings suspicious for recurrent tumor. No worrisome pulmonary nodules. Stable 3 mm right upper lobe nodule medially on image number 46/7. Stable 1-2 mm nodule in the right upper lobe on image number 49/7 these are likely benign. Musculoskeletal: No significant bony findings. CT ABDOMEN PELVIS FINDINGS Hepatobiliary: Stable small peripheral low-attenuation right hepatic lobe lesion near the right kidney possibly an area of scarring  change. No worrisome hepatic lesions to suggest metastatic disease. Gallbladder is mildly contracted. No common bile duct dilatation. Pancreas: No mass, inflammation or ductal dilatation. Spleen: Normal size.  No focal lesions. Adrenals/Urinary Tract: Adrenal glands are unremarkable. Status post left nephrectomy. The right kidney is unremarkable. The bladder is  unremarkable. Stomach/Bowel: The stomach, duodenum, small bowel and colon unremarkable. No acute inflammatory changes, mass lesions or obstructive findings. The terminal ileum and appendix are normal. Sigmoid colon diverticulosis but no findings for acute diverticulitis. Vascular/Lymphatic: Stable significant age advanced atherosclerotic calcifications involving the aorta and iliac arteries and branch vessels. No aneurysm or dissection. The major venous structures are patent. Slit-like IVC may be due to dehydration. No mesenteric or retroperitoneal mass or lymphadenopathy. Small scattered lymph nodes appears stable. Reproductive: The uterus and ovaries are unremarkable. Other: No pelvic mass or adenopathy. No free pelvic fluid collections. No inguinal mass or adenopathy. No abdominal wall hernia or subcutaneous lesions. Musculoskeletal: No significant bony findings. IMPRESSION: 1. Stable loss of volume in the left hemithorax with the persistent moderate-sized complex left pleural fluid collection and associated pleural thickening and areas of subpleural atelectasis. No enhancing pleural tumor is identified. 2. Stable 11.5 mm substernal lymph node. No new or progressive mediastinal/hilar findings. 3. Stable small right upper lobe pulmonary nodules, likely benign. 4. No findings for abdominal/pelvic metastatic disease. 5. Stable significant age advanced atherosclerotic calcifications involving the thoracic and abdominal aorta and branch vessels including the coronary arteries. 6. Status post left nephrectomy. 7. Slit-like IVC may be due to dehydration. 8. Aortic atherosclerosis. Aortic Atherosclerosis (ICD10-I70.0). Electronically Signed   By: Marijo Sanes M.D.   On: 05/19/2020 16:00   CT Abdomen Pelvis W Contrast  Result Date: 05/19/2020 CLINICAL DATA:  Restaging non-small cell lung cancer. EXAM: CT CHEST, ABDOMEN, AND PELVIS WITH CONTRAST TECHNIQUE: Multidetector CT imaging of the chest, abdomen and pelvis was  performed following the standard protocol during bolus administration of intravenous contrast. CONTRAST:  145m OMNIPAQUE IOHEXOL 300 MG/ML  SOLN COMPARISON:  03/15/2020 FINDINGS: CT CHEST FINDINGS Cardiovascular: The heart is normal in size. No pericardial effusion. Stable mild tortuosity and calcification of the thoracic aorta no focal aneurysm or dissection. The branch vessels are patent. Stable ostial calcifications. Stable age advanced three-vessel coronary artery calcifications. Mediastinum/Nodes: Stable 11.5 mm substernal lymph node on image 32/2. No other mediastinal or hilar lymph nodes are identified. The esophagus is grossly normal. Stable small hiatal hernia. Lungs/Pleura: Stable loss of volume in the right hemithorax with the persistent moderate-sized complex left pleural fluid collection and associated pleural thickening and areas of subpleural atelectasis. No findings suspicious for recurrent tumor. No worrisome pulmonary nodules. Stable 3 mm right upper lobe nodule medially on image number 46/7. Stable 1-2 mm nodule in the right upper lobe on image number 49/7 these are likely benign. Musculoskeletal: No significant bony findings. CT ABDOMEN PELVIS FINDINGS Hepatobiliary: Stable small peripheral low-attenuation right hepatic lobe lesion near the right kidney possibly an area of scarring change. No worrisome hepatic lesions to suggest metastatic disease. Gallbladder is mildly contracted. No common bile duct dilatation. Pancreas: No mass, inflammation or ductal dilatation. Spleen: Normal size.  No focal lesions. Adrenals/Urinary Tract: Adrenal glands are unremarkable. Status post left nephrectomy. The right kidney is unremarkable. The bladder is unremarkable. Stomach/Bowel: The stomach, duodenum, small bowel and colon unremarkable. No acute inflammatory changes, mass lesions or obstructive findings. The terminal ileum and appendix are normal. Sigmoid colon diverticulosis but no findings for acute  diverticulitis. Vascular/Lymphatic: Stable significant age  advanced atherosclerotic calcifications involving the aorta and iliac arteries and branch vessels. No aneurysm or dissection. The major venous structures are patent. Slit-like IVC may be due to dehydration. No mesenteric or retroperitoneal mass or lymphadenopathy. Small scattered lymph nodes appears stable. Reproductive: The uterus and ovaries are unremarkable. Other: No pelvic mass or adenopathy. No free pelvic fluid collections. No inguinal mass or adenopathy. No abdominal wall hernia or subcutaneous lesions. Musculoskeletal: No significant bony findings. IMPRESSION: 1. Stable loss of volume in the left hemithorax with the persistent moderate-sized complex left pleural fluid collection and associated pleural thickening and areas of subpleural atelectasis. No enhancing pleural tumor is identified. 2. Stable 11.5 mm substernal lymph node. No new or progressive mediastinal/hilar findings. 3. Stable small right upper lobe pulmonary nodules, likely benign. 4. No findings for abdominal/pelvic metastatic disease. 5. Stable significant age advanced atherosclerotic calcifications involving the thoracic and abdominal aorta and branch vessels including the coronary arteries. 6. Status post left nephrectomy. 7. Slit-like IVC may be due to dehydration. 8. Aortic atherosclerosis. Aortic Atherosclerosis (ICD10-I70.0). Electronically Signed   By: Marijo Sanes M.D.   On: 05/19/2020 16:00     ASSESSMENT/PLAN:  This is a very pleasant 65 year old Caucasian female recently diagnosed with stage IV (T3, N0, M1A) non-small cell lung cancer, adenocarcinoma. She presented with a large left lower lobe lung mass in addition to another lingular mass and a malignant left pleural effusion. She was diagnosed in September 2021. Her molecular studies show that she has a PD-L1 expression at 95%. The patient also has a KRAS G12 C mutation which will be an option in the second line  setting.  The patient is currently undergoingtreatment with single agent Libtayo (Cempilimab) 350 mg IV every 3 weeks for up to 2 years unless the patient has evidence for disease progression or unacceptable toxicity.She is status post 6cycles and tolerated it well without any concerning adverse side effects.   The patient recently had a restaging CT scan of the chest, abdomen, and pelvis performed. Dr. Julien Nordmann personally and independently reviewed the scan and discussed the results with the patient today. The scan showed no evidence for disease progression. She continues to have a stable complex left pleural fluid collection which she is not very symptomatic from. Dr. Julien Nordmann recommends that she continue on the same treatment at the same dose.  She will proceed with cycle #7 today as scheduled.   We will see her back for a follow up visit in 3 weeks for evaluation before starting cycle #8.   The patient was advised to call immediately if she has any concerning symptoms in the interval. The patient voices understanding of current disease status and treatment options and is in agreement with the current care plan. All questions were answered. The patient knows to call the clinic with any problems, questions or concerns. We can certainly see the patient much sooner if necessary   No orders of the defined types were placed in this encounter.    I spent 20-29 minutes in this encounter.   Cassandra L Heilingoetter, PA-C 05/20/20  ADDENDUM: Hematology/Oncology Attending: I had a face-to-face encounter with the patient today.  I reviewed her record, scan and recommended her care plan.  This is a very pleasant 65 years old white female diagnosed with a stage IV non-small cell lung cancer, adenocarcinoma in September 2021 with PD-L1 expression of 95%. The patient started treatment with single agent immunotherapy with Libtayo (Cempilimab) 350 mg IV every 3 weeks status post 6  cycles.  She has been  tolerating this treatment well with no concerning adverse effects. The patient had repeat CT scan of the chest, abdomen pelvis performed recently.  I personally and independently reviewed the scan and discussed the results with the patient and her husband. Her scan showed no concerning findings for disease progression. I recommended for the patient to continue her current treatment with Libtayo (Cempilimab) and she will proceed with cycle #7 today. The patient will come back for follow-up visit in 3 weeks for evaluation before the next cycle of her treatment. She was advised to call immediately if she has any other concerning symptoms in the interval. The total time spent in the appointment was 35 minutes. Disclaimer: This note was dictated with voice recognition software. Similar sounding words can inadvertently be transcribed and may be missed upon review. Eilleen Kempf, MD 05/20/20

## 2020-05-17 ENCOUNTER — Other Ambulatory Visit: Payer: Medicare Other

## 2020-05-17 ENCOUNTER — Other Ambulatory Visit: Payer: Self-pay

## 2020-05-17 ENCOUNTER — Ambulatory Visit (HOSPITAL_COMMUNITY)
Admission: RE | Admit: 2020-05-17 | Discharge: 2020-05-17 | Disposition: A | Discharge status: Home / Self Care | Payer: Medicare Other | Source: Ambulatory Visit | Attending: Internal Medicine | Admitting: Internal Medicine

## 2020-05-17 DIAGNOSIS — C3490 Malignant neoplasm of unspecified part of unspecified bronchus or lung: Secondary | ICD-10-CM | POA: Insufficient documentation

## 2020-05-17 MED ORDER — IOHEXOL 300 MG/ML  SOLN
100.0000 mL | Freq: Once | INTRAMUSCULAR | Status: AC | PRN
  Administered 2020-05-17: 100 mL via INTRAVENOUS

## 2020-05-20 ENCOUNTER — Inpatient Hospital Stay: Payer: Medicare Other

## 2020-05-20 ENCOUNTER — Encounter: Payer: Self-pay | Admitting: Physician Assistant

## 2020-05-20 ENCOUNTER — Inpatient Hospital Stay (HOSPITAL_BASED_OUTPATIENT_CLINIC_OR_DEPARTMENT_OTHER): Payer: Medicare Other | Admitting: Physician Assistant

## 2020-05-20 ENCOUNTER — Other Ambulatory Visit: Payer: Self-pay

## 2020-05-20 ENCOUNTER — Inpatient Hospital Stay: Payer: Medicare Other | Attending: Physician Assistant

## 2020-05-20 VITALS — BP 109/59 | HR 67 | Temp 97.8°F | Resp 17 | Ht 64.5 in | Wt 168.7 lb

## 2020-05-20 DIAGNOSIS — Z905 Acquired absence of kidney: Secondary | ICD-10-CM | POA: Insufficient documentation

## 2020-05-20 DIAGNOSIS — Z5112 Encounter for antineoplastic immunotherapy: Secondary | ICD-10-CM

## 2020-05-20 DIAGNOSIS — C3492 Malignant neoplasm of unspecified part of left bronchus or lung: Secondary | ICD-10-CM

## 2020-05-20 DIAGNOSIS — Z7984 Long term (current) use of oral hypoglycemic drugs: Secondary | ICD-10-CM | POA: Insufficient documentation

## 2020-05-20 DIAGNOSIS — I10 Essential (primary) hypertension: Secondary | ICD-10-CM | POA: Diagnosis not present

## 2020-05-20 DIAGNOSIS — Z79899 Other long term (current) drug therapy: Secondary | ICD-10-CM | POA: Insufficient documentation

## 2020-05-20 DIAGNOSIS — R5382 Chronic fatigue, unspecified: Secondary | ICD-10-CM

## 2020-05-20 DIAGNOSIS — C3432 Malignant neoplasm of lower lobe, left bronchus or lung: Secondary | ICD-10-CM | POA: Diagnosis present

## 2020-05-20 LAB — CBC WITH DIFFERENTIAL (CANCER CENTER ONLY)
Abs Immature Granulocytes: 0.02 10*3/uL (ref 0.00–0.07)
Basophils Absolute: 0.1 10*3/uL (ref 0.0–0.1)
Basophils Relative: 1 %
Eosinophils Absolute: 0.3 10*3/uL (ref 0.0–0.5)
Eosinophils Relative: 6 %
HCT: 33.5 % — ABNORMAL LOW (ref 36.0–46.0)
Hemoglobin: 10.9 g/dL — ABNORMAL LOW (ref 12.0–15.0)
Immature Granulocytes: 0 %
Lymphocytes Relative: 17 %
Lymphs Abs: 0.9 10*3/uL (ref 0.7–4.0)
MCH: 27.2 pg (ref 26.0–34.0)
MCHC: 32.5 g/dL (ref 30.0–36.0)
MCV: 83.5 fL (ref 80.0–100.0)
Monocytes Absolute: 0.4 10*3/uL (ref 0.1–1.0)
Monocytes Relative: 7 %
Neutro Abs: 3.5 10*3/uL (ref 1.7–7.7)
Neutrophils Relative %: 69 %
Platelet Count: 290 10*3/uL (ref 150–400)
RBC: 4.01 MIL/uL (ref 3.87–5.11)
RDW: 13.6 % (ref 11.5–15.5)
WBC Count: 5.2 10*3/uL (ref 4.0–10.5)
nRBC: 0 % (ref 0.0–0.2)

## 2020-05-20 LAB — CMP (CANCER CENTER ONLY)
ALT: 10 U/L (ref 0–44)
AST: 12 U/L — ABNORMAL LOW (ref 15–41)
Albumin: 3.6 g/dL (ref 3.5–5.0)
Alkaline Phosphatase: 55 U/L (ref 38–126)
Anion gap: 9 (ref 5–15)
BUN: 26 mg/dL — ABNORMAL HIGH (ref 8–23)
CO2: 23 mmol/L (ref 22–32)
Calcium: 9.6 mg/dL (ref 8.9–10.3)
Chloride: 104 mmol/L (ref 98–111)
Creatinine: 0.97 mg/dL (ref 0.44–1.00)
GFR, Estimated: >60 mL/min (ref >60)
Glucose, Bld: 121 mg/dL — ABNORMAL HIGH (ref 70–99)
Potassium: 4.1 mmol/L (ref 3.5–5.1)
Sodium: 136 mmol/L (ref 135–145)
Total Bilirubin: 0.3 mg/dL (ref 0.3–1.2)
Total Protein: 7.6 g/dL (ref 6.5–8.1)

## 2020-05-20 LAB — TSH: TSH: 5.129 u[IU]/mL — ABNORMAL HIGH (ref 0.308–3.960)

## 2020-05-20 MED ORDER — SODIUM CHLORIDE 0.9 % IV SOLN
Freq: Once | INTRAVENOUS | Status: AC
  Filled 2020-05-20: qty 250

## 2020-05-20 MED ORDER — SODIUM CHLORIDE 0.9 % IV SOLN
350.0000 mg | Freq: Once | INTRAVENOUS | Status: AC
  Administered 2020-05-20: 350 mg via INTRAVENOUS
  Filled 2020-05-20: qty 7

## 2020-05-20 NOTE — Patient Instructions (Signed)
[facility] Discharge Instructions for Patients Receiving Chemotherapy  Today you received the following chemotherapy agents: Cemiplimab (Libtayo)   To help prevent nausea and vomiting after your treatment, we encourage you to take your nausea medication  as prescribed.    If you develop nausea and vomiting that is not controlled by your nausea medication, call the clinic.   BELOW ARE SYMPTOMS THAT SHOULD BE REPORTED IMMEDIATELY:  *FEVER GREATER THAN 100.5 F  *CHILLS WITH OR WITHOUT FEVER  NAUSEA AND VOMITING THAT IS NOT CONTROLLED WITH YOUR NAUSEA MEDICATION  *UNUSUAL SHORTNESS OF BREATH  *UNUSUAL BRUISING OR BLEEDING  TENDERNESS IN MOUTH AND THROAT WITH OR WITHOUT PRESENCE OF ULCERS  *URINARY PROBLEMS  *BOWEL PROBLEMS  UNUSUAL RASH Items with * indicate a potential emergency and should be followed up as soon as possible.  Feel free to call the clinic should you have any questions or concerns. The clinic phone number is [phone].  Please show the [facility] at check-in to the Emergency Department and triage nurse.

## 2020-06-10 ENCOUNTER — Encounter: Payer: Self-pay | Admitting: Internal Medicine

## 2020-06-10 ENCOUNTER — Inpatient Hospital Stay: Payer: Medicare Other | Attending: Internal Medicine

## 2020-06-10 ENCOUNTER — Inpatient Hospital Stay: Payer: Medicare Other

## 2020-06-10 ENCOUNTER — Inpatient Hospital Stay (HOSPITAL_BASED_OUTPATIENT_CLINIC_OR_DEPARTMENT_OTHER): Payer: Medicare Other | Admitting: Internal Medicine

## 2020-06-10 ENCOUNTER — Other Ambulatory Visit: Payer: Self-pay

## 2020-06-10 VITALS — BP 119/86 | HR 65 | Temp 97.4°F | Resp 16 | Ht 64.5 in | Wt 169.6 lb

## 2020-06-10 DIAGNOSIS — Z5112 Encounter for antineoplastic immunotherapy: Secondary | ICD-10-CM | POA: Insufficient documentation

## 2020-06-10 DIAGNOSIS — J910 Malignant pleural effusion: Secondary | ICD-10-CM | POA: Insufficient documentation

## 2020-06-10 DIAGNOSIS — I10 Essential (primary) hypertension: Secondary | ICD-10-CM | POA: Insufficient documentation

## 2020-06-10 DIAGNOSIS — C3492 Malignant neoplasm of unspecified part of left bronchus or lung: Secondary | ICD-10-CM | POA: Diagnosis not present

## 2020-06-10 DIAGNOSIS — Z79899 Other long term (current) drug therapy: Secondary | ICD-10-CM | POA: Diagnosis not present

## 2020-06-10 DIAGNOSIS — C3432 Malignant neoplasm of lower lobe, left bronchus or lung: Secondary | ICD-10-CM | POA: Diagnosis present

## 2020-06-10 DIAGNOSIS — R5382 Chronic fatigue, unspecified: Secondary | ICD-10-CM

## 2020-06-10 LAB — CMP (CANCER CENTER ONLY)
ALT: 13 U/L (ref 0–44)
AST: 18 U/L (ref 15–41)
Albumin: 3.9 g/dL (ref 3.5–5.0)
Alkaline Phosphatase: 74 U/L (ref 38–126)
Anion gap: 13 (ref 5–15)
BUN: 23 mg/dL (ref 8–23)
CO2: 22 mmol/L (ref 22–32)
Calcium: 9.4 mg/dL (ref 8.9–10.3)
Chloride: 105 mmol/L (ref 98–111)
Creatinine: 0.96 mg/dL (ref 0.44–1.00)
GFR, Estimated: >60 mL/min (ref >60)
Glucose, Bld: 100 mg/dL — ABNORMAL HIGH (ref 70–99)
Potassium: 3.8 mmol/L (ref 3.5–5.1)
Sodium: 140 mmol/L (ref 135–145)
Total Bilirubin: 0.3 mg/dL (ref 0.3–1.2)
Total Protein: 8.1 g/dL (ref 6.5–8.1)

## 2020-06-10 LAB — CBC WITH DIFFERENTIAL (CANCER CENTER ONLY)
Abs Immature Granulocytes: 0.03 10*3/uL (ref 0.00–0.07)
Basophils Absolute: 0.1 10*3/uL (ref 0.0–0.1)
Basophils Relative: 1 %
Eosinophils Absolute: 0.3 10*3/uL (ref 0.0–0.5)
Eosinophils Relative: 6 %
HCT: 37.7 % (ref 36.0–46.0)
Hemoglobin: 11.9 g/dL — ABNORMAL LOW (ref 12.0–15.0)
Immature Granulocytes: 1 %
Lymphocytes Relative: 15 %
Lymphs Abs: 0.7 10*3/uL (ref 0.7–4.0)
MCH: 26.6 pg (ref 26.0–34.0)
MCHC: 31.6 g/dL (ref 30.0–36.0)
MCV: 84.3 fL (ref 80.0–100.0)
Monocytes Absolute: 0.2 10*3/uL (ref 0.1–1.0)
Monocytes Relative: 5 %
Neutro Abs: 3.1 10*3/uL (ref 1.7–7.7)
Neutrophils Relative %: 72 %
Platelet Count: 243 10*3/uL (ref 150–400)
RBC: 4.47 MIL/uL (ref 3.87–5.11)
RDW: 14.6 % (ref 11.5–15.5)
WBC Count: 4.4 10*3/uL (ref 4.0–10.5)
nRBC: 0 % (ref 0.0–0.2)

## 2020-06-10 LAB — TSH: TSH: 7.619 u[IU]/mL — ABNORMAL HIGH (ref 0.308–3.960)

## 2020-06-10 MED ORDER — SODIUM CHLORIDE 0.9% FLUSH
10.0000 mL | INTRAVENOUS | Status: DC | PRN
  Filled 2020-06-10: qty 10

## 2020-06-10 MED ORDER — LEVOTHYROXINE SODIUM 50 MCG PO TABS: 50.0000 ug | ORAL_TABLET | Freq: Every day | ORAL | 2 refills | Status: DC

## 2020-06-10 MED ORDER — SODIUM CHLORIDE 0.9 % IV SOLN
350.0000 mg | Freq: Once | INTRAVENOUS | Status: AC
  Administered 2020-06-10: 350 mg via INTRAVENOUS
  Filled 2020-06-10: qty 7

## 2020-06-10 MED ORDER — SODIUM CHLORIDE 0.9 % IV SOLN
Freq: Once | INTRAVENOUS | Status: AC
  Filled 2020-06-10: qty 250

## 2020-06-10 NOTE — Patient Instructions (Signed)
[facility] Discharge Instructions for Patients Receiving Chemotherapy  Today you received the following chemotherapy agents Cemiplimab(Libtayo)  To help prevent nausea and vomiting after your treatment, we encourage you to take your nausea medication as directed.   If you develop nausea and vomiting that is not controlled by your nausea medication, call the clinic.   BELOW ARE SYMPTOMS THAT SHOULD BE REPORTED IMMEDIATELY:  *FEVER GREATER THAN 100.5 F  *CHILLS WITH OR WITHOUT FEVER  NAUSEA AND VOMITING THAT IS NOT CONTROLLED WITH YOUR NAUSEA MEDICATION  *UNUSUAL SHORTNESS OF BREATH  *UNUSUAL BRUISING OR BLEEDING  TENDERNESS IN MOUTH AND THROAT WITH OR WITHOUT PRESENCE OF ULCERS  *URINARY PROBLEMS  *BOWEL PROBLEMS  UNUSUAL RASH Items with * indicate a potential emergency and should be followed up as soon as possible.  Feel free to call the clinic should you have any questions or concerns. The clinic phone number is [phone].  Please show the [facility] at check-in to the Emergency Department and triage nurse.

## 2020-06-10 NOTE — Progress Notes (Signed)
[place] [phone]   [phone]  OFFICE PROGRESS NOTE  [name], NP [place] [address]  DIAGNOSIS: stage IV (T3, N0, M1 a) non-small cell lung cancer, adenocarcinoma presented with large left lower lobe lung mass in addition to another lingual mass and malignant left pleural effusion diagnosed in September 2021  Biomarker Findings Tumor Mutational Burden - 13 Muts/Mb Microsatellite status - MS-Stable Genomic Findings For a complete list of the genes assayed, please refer to the Appendix. KRAS G12C PRDM1 D216f*46 TERT promoter -146C>T TP53 V157F 7 Disease relevant genes with no reportable alterations: ALK, BRAF, EGFR, ERBB2, MET, RET, ROS1   PDL1 Expression: 95%  PRIOR THERAPY: None.  CURRENT THERAPY: Libtayo (Cempilimab) 350 mg IV every 3 weeks.  First dose January 15, 2020.  Status post 7 cycles.  [name] 65y.o. female returns to the clinic today for follow-up visit accompanied by her husband.  The patient is feeling fine today with no concerning complaints.  She denied having any chest pain, cough or hemoptysis.  She denied having any fever or chills.  She has no nausea, vomiting, diarrhea or constipation.  She denied having any headache or visual changes.  She has no weight loss or night sweats.  She is here today for evaluation before starting cycle #8 of her treatment with Libtayo (Cempilimab).   MEDICAL HISTORY: Past Medical History:  Diagnosis Date  . Hyperlipidemia   . Hypertension   . Pleural effusion   . Pneumonia   . Stage IV adenocarcinoma of lung, left (HCC)     ALLERGIES:  is allergic to ace inhibitors.  MEDICATIONS:  Current Outpatient Medications  Medication Sig Dispense Refill  . cholecalciferol (VITAMIN D3) 25 MCG (1000 UNIT) tablet Take 2,000 Units by mouth daily.    .Marland Kitchenlosartan-hydrochlorothiazide (HYZAAR) 100-12.5 MG tablet Take 1 tablet by mouth  daily.    . metFORMIN (GLUCOPHAGE) 500 MG tablet Take 500 mg by mouth 2 (two) times daily with a meal.    . pravastatin (PRAVACHOL) 40 MG tablet Take 40 mg by mouth daily.    . prochlorperazine (COMPAZINE) 10 MG tablet Take 1 tablet (10 mg total) by mouth every 6 (six) hours as needed for nausea or vomiting. (Patient not taking: Reported on 05/20/2020) 30 tablet 0  . sertraline (ZOLOFT) 50 MG tablet Take 75 mg by mouth daily.     No current facility-administered medications for this visit.    SURGICAL HISTORY:  Past Surgical History:  Procedure Laterality Date  . NEPHRECTOMY Left     REVIEW OF SYSTEMS:  A comprehensive review of systems was negative except for: Respiratory: positive for dyspnea on exertion   PHYSICAL EXAMINATION: General appearance: alert, cooperative and no distress Head: Normocephalic, without obvious abnormality, atraumatic Neck: no adenopathy, no JVD, supple, symmetrical, trachea midline and thyroid not enlarged, symmetric, no tenderness/mass/nodules Lymph nodes: Cervical, supraclavicular, and axillary nodes normal. Resp: clear to auscultation bilaterally Back: symmetric, no curvature. ROM normal. No CVA tenderness. Cardio: regular rate and rhythm, S1, S2 normal, no murmur, click, rub or gallop GI: soft, non-tender; bowel sounds normal; no masses,  no organomegaly Extremities: extremities normal, atraumatic, no cyanosis or edema  ECOG PERFORMANCE STATUS: 0 - Asymptomatic  Blood pressure 119/86, pulse 65, temperature (!) 97.4 F (36.3 C), temperature source Tympanic, resp. rate 16, height 5' 4.5" (1.638 m), weight 169 lb 9.6 oz (76.9 kg), SpO2 98 %.  LABORATORY DATA: Lab Results  Component Value Date   WBC 4.4 06/10/2020   HGB 11.9 (L) 06/10/2020   HCT 37.7 06/10/2020   MCV 84.3 06/10/2020   PLT 243 06/10/2020      Chemistry      Component Value Date/Time   NA 140 06/10/2020 1035   K 3.8 06/10/2020 1035   CL 105 06/10/2020 1035   CO2 22 06/10/2020  1035   BUN 23 06/10/2020 1035   CREATININE 0.96 06/10/2020 1035      Component Value Date/Time   CALCIUM 9.4 06/10/2020 1035   ALKPHOS 74 06/10/2020 1035   AST 18 06/10/2020 1035   ALT 13 06/10/2020 1035   BILITOT 0.3 06/10/2020 1035       RADIOGRAPHIC STUDIES: CT Chest W Contrast  Result Date: 05/19/2020 CLINICAL DATA:  Restaging non-small cell lung cancer. EXAM: CT CHEST, ABDOMEN, AND PELVIS WITH CONTRAST TECHNIQUE: Multidetector CT imaging of the chest, abdomen and pelvis was performed following the standard protocol during bolus administration of intravenous contrast. CONTRAST:  179m OMNIPAQUE IOHEXOL 300 MG/ML  SOLN COMPARISON:  03/15/2020 FINDINGS: CT CHEST FINDINGS Cardiovascular: The heart is normal in size. No pericardial effusion. Stable mild tortuosity and calcification of the thoracic aorta no focal aneurysm or dissection. The branch vessels are patent. Stable ostial calcifications. Stable age advanced three-vessel coronary artery calcifications. Mediastinum/Nodes: Stable 11.5 mm substernal lymph node on image 32/2. No other mediastinal or hilar lymph nodes are identified. The esophagus is grossly normal. Stable small hiatal hernia. Lungs/Pleura: Stable loss of volume in the right hemithorax with the persistent moderate-sized complex left pleural fluid collection and associated pleural thickening and areas of subpleural atelectasis. No findings suspicious for recurrent tumor. No worrisome pulmonary nodules. Stable 3 mm right upper lobe nodule medially on image number 46/7. Stable 1-2 mm nodule in the right upper lobe on image number 49/7 these are likely benign. Musculoskeletal: No significant bony findings. CT ABDOMEN PELVIS FINDINGS Hepatobiliary: Stable small peripheral low-attenuation right hepatic lobe lesion near the right kidney possibly an area of scarring change. No worrisome hepatic lesions to suggest metastatic disease. Gallbladder is mildly contracted. No common bile duct  dilatation. Pancreas: No mass, inflammation or ductal dilatation. Spleen: Normal size.  No focal lesions. Adrenals/Urinary Tract: Adrenal glands are unremarkable. Status post left nephrectomy. The right kidney is unremarkable. The bladder is unremarkable. Stomach/Bowel: The stomach, duodenum, small bowel and colon unremarkable. No acute inflammatory changes, mass lesions or obstructive findings. The terminal ileum and appendix are normal. Sigmoid colon diverticulosis but no findings for acute diverticulitis. Vascular/Lymphatic: Stable significant age advanced atherosclerotic calcifications involving the aorta and iliac arteries and branch vessels. No aneurysm or dissection. The major venous structures are patent. Slit-like IVC may be due to dehydration. No mesenteric or retroperitoneal mass or lymphadenopathy. Small scattered lymph nodes appears stable. Reproductive: The uterus and ovaries are unremarkable. Other: No pelvic mass or adenopathy. No free pelvic fluid collections. No inguinal mass or adenopathy. No abdominal wall hernia or subcutaneous lesions. Musculoskeletal: No significant bony findings. IMPRESSION: 1. Stable loss of volume in the left hemithorax with the persistent moderate-sized complex left pleural fluid collection and associated pleural thickening and areas of subpleural atelectasis. No enhancing pleural tumor is identified. 2. Stable 11.5 mm substernal lymph node. No new or progressive mediastinal/hilar findings. 3. Stable small right upper lobe pulmonary nodules, likely benign. 4. No findings for abdominal/pelvic metastatic disease. 5. Stable significant age advanced atherosclerotic calcifications involving the thoracic and abdominal aorta and branch vessels  including the coronary arteries. 6. Status post left nephrectomy. 7. Slit-like IVC may be due to dehydration. 8. Aortic atherosclerosis. Aortic Atherosclerosis (ICD10-I70.0). Electronically Signed   By: Marijo Sanes M.D.   On: 05/19/2020  16:00   CT Abdomen Pelvis W Contrast  Result Date: 05/19/2020 CLINICAL DATA:  Restaging non-small cell lung cancer. EXAM: CT CHEST, ABDOMEN, AND PELVIS WITH CONTRAST TECHNIQUE: Multidetector CT imaging of the chest, abdomen and pelvis was performed following the standard protocol during bolus administration of intravenous contrast. CONTRAST:  166m OMNIPAQUE IOHEXOL 300 MG/ML  SOLN COMPARISON:  03/15/2020 FINDINGS: CT CHEST FINDINGS Cardiovascular: The heart is normal in size. No pericardial effusion. Stable mild tortuosity and calcification of the thoracic aorta no focal aneurysm or dissection. The branch vessels are patent. Stable ostial calcifications. Stable age advanced three-vessel coronary artery calcifications. Mediastinum/Nodes: Stable 11.5 mm substernal lymph node on image 32/2. No other mediastinal or hilar lymph nodes are identified. The esophagus is grossly normal. Stable small hiatal hernia. Lungs/Pleura: Stable loss of volume in the right hemithorax with the persistent moderate-sized complex left pleural fluid collection and associated pleural thickening and areas of subpleural atelectasis. No findings suspicious for recurrent tumor. No worrisome pulmonary nodules. Stable 3 mm right upper lobe nodule medially on image number 46/7. Stable 1-2 mm nodule in the right upper lobe on image number 49/7 these are likely benign. Musculoskeletal: No significant bony findings. CT ABDOMEN PELVIS FINDINGS Hepatobiliary: Stable small peripheral low-attenuation right hepatic lobe lesion near the right kidney possibly an area of scarring change. No worrisome hepatic lesions to suggest metastatic disease. Gallbladder is mildly contracted. No common bile duct dilatation. Pancreas: No mass, inflammation or ductal dilatation. Spleen: Normal size.  No focal lesions. Adrenals/Urinary Tract: Adrenal glands are unremarkable. Status post left nephrectomy. The right kidney is unremarkable. The bladder is unremarkable.  Stomach/Bowel: The stomach, duodenum, small bowel and colon unremarkable. No acute inflammatory changes, mass lesions or obstructive findings. The terminal ileum and appendix are normal. Sigmoid colon diverticulosis but no findings for acute diverticulitis. Vascular/Lymphatic: Stable significant age advanced atherosclerotic calcifications involving the aorta and iliac arteries and branch vessels. No aneurysm or dissection. The major venous structures are patent. Slit-like IVC may be due to dehydration. No mesenteric or retroperitoneal mass or lymphadenopathy. Small scattered lymph nodes appears stable. Reproductive: The uterus and ovaries are unremarkable. Other: No pelvic mass or adenopathy. No free pelvic fluid collections. No inguinal mass or adenopathy. No abdominal wall hernia or subcutaneous lesions. Musculoskeletal: No significant bony findings. IMPRESSION: 1. Stable loss of volume in the left hemithorax with the persistent moderate-sized complex left pleural fluid collection and associated pleural thickening and areas of subpleural atelectasis. No enhancing pleural tumor is identified. 2. Stable 11.5 mm substernal lymph node. No new or progressive mediastinal/hilar findings. 3. Stable small right upper lobe pulmonary nodules, likely benign. 4. No findings for abdominal/pelvic metastatic disease. 5. Stable significant age advanced atherosclerotic calcifications involving the thoracic and abdominal aorta and branch vessels including the coronary arteries. 6. Status post left nephrectomy. 7. Slit-like IVC may be due to dehydration. 8. Aortic atherosclerosis. Aortic Atherosclerosis (ICD10-I70.0). Electronically Signed   By: PMarijo SanesM.D.   On: 05/19/2020 16:00    ASSESSMENT AND PLAN: This is a very pleasant 65years old white female recently diagnosed with a stage IV (T3, N0, M1 a) non-small cell lung cancer, adenocarcinoma presented with large left lower lobe lung mass in addition to another lingular  mass and malignant left pleural effusion diagnosed in  September 2021.  The molecular studies showed positive PD-L1 expression of 95%.  The patient also has K-ras G12C mutation which will be an option for the second line treatment. With the high PD-L1 expression I discussed with the patient treatment with single agent Libtayo (Cempilimab) 350 mg IV every 3 weeks for up to 2 years unless the patient has evidence for disease progression or unacceptable toxicity. The patient started the first cycle of this treatment on January 15, 2020. She is status post 7 cycles.  She continues to tolerate her treatment fairly well with no concerning adverse effects. I recommended for her to proceed with cycle #8 today as planned. I will see her back for follow-up visit in 3 weeks for evaluation before the next cycle of her treatment. 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 daily. The patient was advised to call immediately if she has any concerning symptoms in the interval. The patient voices understanding of current disease status and treatment options and is in agreement with the current care plan.  All questions were answered. The patient knows to call the clinic with any problems, questions or concerns. We can certainly see the patient much sooner if necessary.   Disclaimer: This note was dictated with voice recognition software. Similar sounding words can inadvertently be transcribed and may not be corrected upon review.

## 2020-06-26 NOTE — Progress Notes (Signed)
[facility] OFFICE PROGRESS NOTE  [name], NP [address] [place] [address]  DIAGNOSIS: Stage IV (T3, N0, M1 a) non-small cell lung cancer, adenocarcinoma presented with large left lower lobe lung mass in addition to another lingual mass and malignant left pleural effusion diagnosed in September 2021  Biomarker Findings Tumor Mutational Burden - 13 Muts/Mb Microsatellite status - MS-Stable Genomic Findings For a complete list of the genes assayed, please refer to the Appendix. KRAS G12C PRDM1 D218f*46 TERT promoter -146C>T TP53 V157F 7 Disease relevant genes with no reportable alterations: ALK, BRAF, EGFR, ERBB2, MET, RET, ROS1   PDL1 Expression: 95%  PRIOR THERAPY: None  CURRENT THERAPY: Libtayo (Cempilimab) 350 mg IV every 3 weeks. First dose January 15, 2020.Status post8cycles.  [name] 65y.o. female returns to the clinic for a follow up visit accompanied by her husband. The patient is feeling well today without any concerning complaints. The patient continues to tolerate treatment with immunotherapy with Libtayo well without any adverse side effects except she does have some scattered skin lesions which are consistent with immunotherapy mediated. They do not bother her and if they itch she uses hydrocortisone cream. Denies any fever, chills, night sweats, or weight loss. Denies any chest pain, cough, or hemoptysis. She denies any significant shortness of breath. She has had a stable pleural effusion which she is not very symptomatic from. Denies any nausea, vomiting, diarrhea, or constipation. Denies any headache or visual changes. The patient is here today for evaluation prior to starting cycle # 9.  MEDICAL HISTORY: Past Medical History:  Diagnosis Date  . Hyperlipidemia   . Hypertension   . Pleural effusion   . Pneumonia   . Stage IV adenocarcinoma of lung, left (HCC)     ALLERGIES:  is allergic to  ace inhibitors.  MEDICATIONS:  Current Outpatient Medications  Medication Sig Dispense Refill  . cholecalciferol (VITAMIN D3) 25 MCG (1000 UNIT) tablet Take 2,000 Units by mouth daily.    .Marland Kitchenlevothyroxine (SYNTHROID) 50 MCG tablet Take 1 tablet (50 mcg total) by mouth daily before breakfast. 30 tablet 2  . losartan-hydrochlorothiazide (HYZAAR) 100-12.5 MG tablet Take 1 tablet by mouth daily.    . metFORMIN (GLUCOPHAGE) 500 MG tablet Take 500 mg by mouth 2 (two) times daily with a meal.    . pravastatin (PRAVACHOL) 40 MG tablet Take 40 mg by mouth daily.    . sertraline (ZOLOFT) 50 MG tablet Take 75 mg by mouth daily.    . prochlorperazine (COMPAZINE) 10 MG tablet Take 1 tablet (10 mg total) by mouth every 6 (six) hours as needed for nausea or vomiting. (Patient not taking: No sig reported) 30 tablet 0   No current facility-administered medications for this visit.    SURGICAL HISTORY:  Past Surgical History:  Procedure Laterality Date  . NEPHRECTOMY Left     REVIEW OF SYSTEMS:   Review of Systems  Constitutional: Negative for appetite change, chills, fatigue, fever and unexpected weight change.  HENT: Negative for mouth sores, nosebleeds, sore throat and trouble swallowing.   Eyes: Negative for eye problems and icterus.  Respiratory: Negative for cough, hemoptysis, shortness of breath and wheezing.   Cardiovascular: Negative for chest pain and leg swelling.  Gastrointestinal: Negative for abdominal pain, constipation, diarrhea, nausea and vomiting.  Genitourinary: Negative for bladder incontinence, difficulty urinating, dysuria, frequency and hematuria.   Musculoskeletal: Negative for back pain, gait problem, neck pain and neck stiffness.  Skin: Positive for scattered skin lesions likely secondary to her immunotherapy.  Neurological: Negative for dizziness, extremity weakness, gait problem, headaches, light-headedness and seizures.  Hematological: Negative for adenopathy. Does not  bruise/bleed easily.  Psychiatric/Behavioral: Negative for confusion, depression and sleep disturbance. The patient is not nervous/anxious.     PHYSICAL EXAMINATION:  Blood pressure (!) 124/58, pulse 71, temperature (!) 96.4 F (35.8 C), temperature source Tympanic, resp. rate 18, weight 168 lb 12.8 oz (76.6 kg), SpO2 100 %.  ECOG PERFORMANCE STATUS: 1 - Symptomatic but completely ambulatory  Physical Exam  Constitutional: Oriented to person, place, and time and well-developed, well-nourished, and in no distress. No distress.  HENT:  Head: Normocephalic and atraumatic.  Mouth/Throat: Oropharynx is clear and moist. No oropharyngeal exudate.  Eyes: Conjunctivae are normal. Right eye exhibits no discharge. Left eye exhibits no discharge. No scleral icterus.  Neck: Normal range of motion. Neck supple.  Cardiovascular: Normal rate, regular rhythm, normal heart sounds and intact distal pulses.   Pulmonary/Chest: Effort normal. Decreased breath sounds in left lower lung field. No respiratory distress. No wheezes. No rales.  Abdominal: Soft. Bowel sounds are normal. Exhibits no distension and no mass. There is no tenderness.  Musculoskeletal: Normal range of motion. Exhibits no edema.  Lymphadenopathy:    No cervical adenopathy.  Neurological: Alert and oriented to person, place, and time. Exhibits normal muscle tone. Gait normal. Coordination normal.  Skin: Skin is warm and dry. Some dry scaly small scattered skin lesions on chest and extremities. Not diaphoretic. No erythema. No pallor.  Psychiatric: Mood, memory and judgment normal.  Vitals reviewed.  LABORATORY DATA: Lab Results  Component Value Date   WBC 4.7 07/01/2020   HGB 11.9 (L) 07/01/2020   HCT 38.2 07/01/2020   MCV 85.7 07/01/2020   PLT 258 07/01/2020      Chemistry      Component Value Date/Time   NA 139 07/01/2020 0950   K 3.4 (L) 07/01/2020 0950   CL 103 07/01/2020 0950   CO2 22 07/01/2020 0950   BUN 19 07/01/2020  0950   CREATININE 1.04 (H) 07/01/2020 0950      Component Value Date/Time   CALCIUM 9.5 07/01/2020 0950   ALKPHOS 69 07/01/2020 0950   AST 19 07/01/2020 0950   ALT 11 07/01/2020 0950   BILITOT 0.3 07/01/2020 0950       RADIOGRAPHIC STUDIES:  No results found.   ASSESSMENT/PLAN:  This is a very pleasant 65 year old Caucasian female diagnosed with stage IV (T3, N0, M1A) non-small cell lung cancer, adenocarcinoma. She presented with a large left lower lobe lung mass in addition to another lingular mass and a malignant left pleural effusion. She was diagnosed in September 2021. Her molecular studies show that she has a PD-L1 expression at 95%. 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  The patient is currently undergoingtreatment with single agent Libtayo (Cempilimab) 350 mg IV every 3 weeks for up to 2 years unless the patient has evidence for disease progression or unacceptable toxicity.She is status post8cyclesand tolerated it well without any concerning adverse side effects.   Labs were reviewed. Recommend that she proceed with cycle #9 today as scheduled. Her potassium was slightly low and I provided her a handout with potassium rich food. We will continue to monitor her TSH closely which is pending. She was recently started on 50 mcg of synthroid and will adjust her dose at her next appointment  if needed.   I will arrange for a restaging CT scan of the chest, abdomen, and pelvis prior to starting her next cycle of treatment.   We will see her back for a follow up visit in 3 weeks for evaluation and to review her scan results before starting cycle #10.   The patient was advised to call immediately if she has any concerning symptoms in the interval. The patient voices understanding of current disease status and treatment options and is in agreement with the current care plan. All questions were answered. The patient knows  to call the clinic with any problems, questions or concerns. We can certainly see the patient much sooner if necessary         Orders Placed This Encounter  Procedures  . CT Chest W Contrast    Standing Status:   Future    Standing Expiration Date:   07/01/2021    Order Specific Question:   If indicated for the ordered procedure, I authorize the administration of contrast media per Radiology protocol    Answer:   Yes    Order Specific Question:   Preferred imaging location?    Answer:   [facility]  . CT Abdomen Pelvis W Contrast    Standing Status:   Future    Standing Expiration Date:   07/01/2021    Order Specific Question:   If indicated for the ordered procedure, I authorize the administration of contrast media per Radiology protocol    Answer:   Yes    Order Specific Question:   Preferred imaging location?    Answer:   [facility]    Order Specific Question:   Is Oral Contrast requested for this exam?    Answer:   Yes, Per Radiology protocol     I spent 20-29 minutes in this encounter  Danton Palmateer L Annalysia Willenbring, PA-C 07/01/20

## 2020-07-01 ENCOUNTER — Other Ambulatory Visit: Payer: Medicare Other

## 2020-07-01 ENCOUNTER — Inpatient Hospital Stay: Payer: Medicare Other

## 2020-07-01 ENCOUNTER — Inpatient Hospital Stay (HOSPITAL_BASED_OUTPATIENT_CLINIC_OR_DEPARTMENT_OTHER): Payer: Medicare Other | Admitting: Physician Assistant

## 2020-07-01 ENCOUNTER — Encounter: Payer: Self-pay | Admitting: Physician Assistant

## 2020-07-01 ENCOUNTER — Ambulatory Visit: Payer: Medicare Other

## 2020-07-01 ENCOUNTER — Ambulatory Visit: Payer: Medicare Other | Admitting: Internal Medicine

## 2020-07-01 ENCOUNTER — Other Ambulatory Visit: Payer: Self-pay

## 2020-07-01 VITALS — BP 124/58 | HR 71 | Temp 96.4°F | Resp 18 | Wt 168.8 lb

## 2020-07-01 DIAGNOSIS — C3492 Malignant neoplasm of unspecified part of left bronchus or lung: Secondary | ICD-10-CM

## 2020-07-01 DIAGNOSIS — R5382 Chronic fatigue, unspecified: Secondary | ICD-10-CM

## 2020-07-01 DIAGNOSIS — Z5112 Encounter for antineoplastic immunotherapy: Secondary | ICD-10-CM | POA: Diagnosis not present

## 2020-07-01 LAB — CBC WITH DIFFERENTIAL (CANCER CENTER ONLY)
Abs Immature Granulocytes: 0.01 10*3/uL (ref 0.00–0.07)
Basophils Absolute: 0.1 10*3/uL (ref 0.0–0.1)
Basophils Relative: 2 %
Eosinophils Absolute: 0.3 10*3/uL (ref 0.0–0.5)
Eosinophils Relative: 7 %
HCT: 38.2 % (ref 36.0–46.0)
Hemoglobin: 11.9 g/dL — ABNORMAL LOW (ref 12.0–15.0)
Immature Granulocytes: 0 %
Lymphocytes Relative: 15 %
Lymphs Abs: 0.7 10*3/uL (ref 0.7–4.0)
MCH: 26.7 pg (ref 26.0–34.0)
MCHC: 31.2 g/dL (ref 30.0–36.0)
MCV: 85.7 fL (ref 80.0–100.0)
Monocytes Absolute: 0.3 10*3/uL (ref 0.1–1.0)
Monocytes Relative: 6 %
Neutro Abs: 3.3 10*3/uL (ref 1.7–7.7)
Neutrophils Relative %: 70 %
Platelet Count: 258 10*3/uL (ref 150–400)
RBC: 4.46 MIL/uL (ref 3.87–5.11)
RDW: 15.3 % (ref 11.5–15.5)
WBC Count: 4.7 10*3/uL (ref 4.0–10.5)
nRBC: 0 % (ref 0.0–0.2)

## 2020-07-01 LAB — CMP (CANCER CENTER ONLY)
ALT: 11 U/L (ref 0–44)
AST: 19 U/L (ref 15–41)
Albumin: 3.7 g/dL (ref 3.5–5.0)
Alkaline Phosphatase: 69 U/L (ref 38–126)
Anion gap: 14 (ref 5–15)
BUN: 19 mg/dL (ref 8–23)
CO2: 22 mmol/L (ref 22–32)
Calcium: 9.5 mg/dL (ref 8.9–10.3)
Chloride: 103 mmol/L (ref 98–111)
Creatinine: 1.04 mg/dL — ABNORMAL HIGH (ref 0.44–1.00)
GFR, Estimated: 60 mL/min — ABNORMAL LOW (ref >60)
Glucose, Bld: 181 mg/dL — ABNORMAL HIGH (ref 70–99)
Potassium: 3.4 mmol/L — ABNORMAL LOW (ref 3.5–5.1)
Sodium: 139 mmol/L (ref 135–145)
Total Bilirubin: 0.3 mg/dL (ref 0.3–1.2)
Total Protein: 7.7 g/dL (ref 6.5–8.1)

## 2020-07-01 LAB — TSH: TSH: 4.795 u[IU]/mL — ABNORMAL HIGH (ref 0.308–3.960)

## 2020-07-01 MED ORDER — SODIUM CHLORIDE 0.9 % IV SOLN
350.0000 mg | Freq: Once | INTRAVENOUS | Status: AC
  Administered 2020-07-01: 350 mg via INTRAVENOUS
  Filled 2020-07-01: qty 7

## 2020-07-01 MED ORDER — SODIUM CHLORIDE 0.9 % IV SOLN
Freq: Once | INTRAVENOUS | Status: AC
  Filled 2020-07-01: qty 250

## 2020-07-01 NOTE — Patient Instructions (Signed)
[facility]  Discharge Instructions: Thank you for choosing [facility] to provide your oncology and hematology care.   If you have a lab appointment with the [facility], please go directly to the [facility] and check in at the registration area.   Wear comfortable clothing and clothing appropriate for easy access to any Portacath or PICC line.   We strive to give you quality time with your provider. You may need to reschedule your appointment if you arrive late (15 or more minutes).  Arriving late affects you and other patients whose appointments are after yours.  Also, if you miss three or more appointments without notifying the office, you may be dismissed from the clinic at the provider's discretion.      For prescription refill requests, have your pharmacy contact our office and allow 72 hours for refills to be completed.    Today you received the following chemotherapy and/or immunotherapy agent: Cemiplimab (Libtayo)   To help prevent nausea and vomiting after your treatment, we encourage you to take your nausea medication as directed.  BELOW ARE SYMPTOMS THAT SHOULD BE REPORTED IMMEDIATELY: . *FEVER GREATER THAN 100.4 F (38 C) OR HIGHER . *CHILLS OR SWEATING . *NAUSEA AND VOMITING THAT IS NOT CONTROLLED WITH YOUR NAUSEA MEDICATION . *UNUSUAL SHORTNESS OF BREATH . *UNUSUAL BRUISING OR BLEEDING . *URINARY PROBLEMS (pain or burning when urinating, or frequent urination) . *BOWEL PROBLEMS (unusual diarrhea, constipation, pain near the anus) . TENDERNESS IN MOUTH AND THROAT WITH OR WITHOUT PRESENCE OF ULCERS (sore throat, sores in mouth, or a toothache) . UNUSUAL RASH, SWELLING OR PAIN  . UNUSUAL VAGINAL DISCHARGE OR ITCHING   Items with * indicate a potential emergency and should be followed up as soon as possible or go to the Emergency Department if any problems should occur.  Please show the CHEMOTHERAPY ALERT CARD or IMMUNOTHERAPY  ALERT CARD at check-in to the Emergency Department and triage nurse.  Should you have questions after your visit or need to cancel or reschedule your appointment, please contact [facility]  Dept: [phone]  and follow the prompts.  Office hours are 8:00 a.m. to 4:30 p.m. Monday - Friday. Please note that voicemails left after 4:00 p.m. may not be returned until the following business day.  We are closed weekends and major holidays. You have access to a nurse at all times for urgent questions. Please call the main number to the clinic Dept: [phone] and follow the prompts.   For any non-urgent questions, you may also contact your provider using MyChart. We now offer e-Visits for anyone 64 and older to request care online for non-urgent symptoms. For details visit [email or web address].   Also download the MyChart app! Go to the app store, search "MyChart", open the app, select [facility], and log in with your MyChart username and password.  Due to Covid, a mask is required upon entering the hospital/clinic. If you do not have a mask, one will be given to you upon arrival. For doctor visits, patients may have 1 support person aged 41 or older with them. For treatment visits, patients cannot have anyone with them due to current Covid guidelines and our immunocompromised population.    Hypokalemia Hypokalemia means that the amount of potassium in the blood is lower than normal. Potassium is a chemical (electrolyte) that helps regulate the amount of fluid in the body. It also stimulates muscle tightening (contraction) and helps nerves work  properly. Normally, most of the body's potassium is inside cells, and only a very small amount is in the blood. Because the amount in the blood is so small, minor changes to potassium levels in the blood can be life-threatening. What are the causes? This condition may be caused by:  Antibiotic medicine.  Diarrhea or vomiting.  Taking too much of a medicine that helps you have a bowel movement (laxative) can cause diarrhea and lead to hypokalemia.  Chronic kidney disease (CKD).  Medicines that help the body get rid of excess fluid (diuretics).  Eating disorders, such as bulimia.  Low magnesium levels in the body.  Sweating a lot. What are the signs or symptoms? Symptoms of this condition include:  Weakness.  Constipation.  Fatigue.  Muscle cramps.  Mental confusion.  Skipped heartbeats or irregular heartbeat (palpitations).  Tingling or numbness. How is this diagnosed? This condition is diagnosed with a blood test. How is this treated? This condition may be treated by:  Taking potassium supplements by mouth.  Adjusting the medicines that you take.  Eating more foods that contain a lot of potassium. If your potassium level is very low, you may need to get potassium through an IV and be monitored in the hospital. Follow these instructions at home:  Take over-the-counter and prescription medicines only as told by your health care provider. This includes vitamins and supplements.  Eat a healthy diet. A healthy diet includes fresh fruits and vegetables, whole grains, healthy fats, and lean proteins.  If instructed, eat more foods that contain a lot of potassium. This includes: ? Nuts, such as peanuts and pistachios. ? Seeds, such as sunflower seeds and pumpkin seeds. ? Peas, lentils, and lima beans. ? Whole grain and bran cereals and breads. ? Fresh fruits and vegetables, such as apricots, avocado, bananas, cantaloupe, kiwi, oranges, tomatoes, asparagus, and potatoes. ? Orange juice. ? Tomato juice. ? Red meats. ? Yogurt.  Keep all follow-up visits as told by your health care provider. This is important.   Contact a health care provider if you:  Have weakness that gets worse.  Feel your heart pounding or racing.  Vomit.  Have diarrhea.  Have diabetes (diabetes mellitus) and you  have trouble keeping your blood sugar (glucose) in your target range. Get help right away if you:  Have chest pain.  Have shortness of breath.  Have vomiting or diarrhea that lasts for more than 2 days.  Faint. Summary  Hypokalemia means that the amount of potassium in the blood is lower than normal.  This condition is diagnosed with a blood test.  Hypokalemia may be treated by taking potassium supplements, adjusting the medicines that you take, or eating more foods that are high in potassium.  If your potassium level is very low, you may need to get potassium through an IV and be monitored in the hospital. This information is not intended to replace advice given to you by your health care provider. Make sure you discuss any questions you have with your health care provider. Document Revised: 10/06/2017 Document Reviewed: 10/06/2017 Elsevier Patient Education  [employer].

## 2020-07-08 ENCOUNTER — Encounter: Payer: Self-pay | Admitting: Internal Medicine

## 2020-07-09 ENCOUNTER — Encounter: Payer: Self-pay | Admitting: Internal Medicine

## 2020-07-17 ENCOUNTER — Telehealth: Payer: Self-pay | Admitting: Internal Medicine

## 2020-07-17 NOTE — Telephone Encounter (Signed)
Moved 6/6 appt to 6/7 due to provider schedule. Called and spoke with patient. Confirmed new date and time

## 2020-07-18 ENCOUNTER — Other Ambulatory Visit: Payer: Self-pay

## 2020-07-18 ENCOUNTER — Ambulatory Visit (HOSPITAL_COMMUNITY)
Admission: RE | Admit: 2020-07-18 | Discharge: 2020-07-18 | Disposition: A | Discharge status: Home / Self Care | Payer: Medicare Other | Source: Ambulatory Visit | Attending: Physician Assistant | Admitting: Physician Assistant

## 2020-07-18 DIAGNOSIS — C3492 Malignant neoplasm of unspecified part of left bronchus or lung: Secondary | ICD-10-CM | POA: Insufficient documentation

## 2020-07-18 MED ORDER — IOHEXOL 300 MG/ML  SOLN
75.0000 mL | Freq: Once | INTRAMUSCULAR | Status: AC | PRN
  Administered 2020-07-18: 75 mL via INTRAVENOUS

## 2020-07-18 MED ORDER — SODIUM CHLORIDE (PF) 0.9 % IJ SOLN
INTRAMUSCULAR | Status: AC
  Filled 2020-07-18: qty 50

## 2020-07-19 NOTE — Progress Notes (Signed)
[facility] OFFICE PROGRESS NOTE  Gabrielle Dandy, NP [address] [place] [address]  DIAGNOSIS: Stage IV (T3, N0, M1 a) non-small cell lung cancer, adenocarcinoma presented with large left lower lobe lung mass in addition to another lingual mass and malignant left pleural effusion diagnosed in September 2021  Biomarker Findings Tumor Mutational Burden - 13 Muts/Mb Microsatellite status - MS-Stable Genomic Findings For a complete list of the genes assayed, please refer to the Appendix. KRAS G12C PRDM1 D280f*46 TERT promoter -146C>T TP53 V157F 7 Disease relevant genes with no reportable alterations: ALK, BRAF, EGFR, ERBB2, MET, RET, ROS1   PDL1 Expression: 95%  PRIOR THERAPY: None  CURRENT THERAPY: Libtayo (Cempilimab) 350 mg IV every 3 weeks. First dose January 15, 2020.Status post9cycles.   [name] 65y.o. female returns to the clinic for a follow up visitaccompanied by her husband. The patient is feeling well today without any concerning complaints except for leg cramping bilaterally especially at night for 1 week. She is trying to stay hydrated. The patient continues to tolerate treatment withimmunotherapy with Libtayowell without any adverse sideeffects except she does have some scattered skin lesions which are consistent with immunotherapy mediated. They do itch her. She will use hydrocortisone cream. She has not tried taking benadryl or an anti-histamine. Denies any fever, chills, night sweats, or weight loss. Denies any chest pain, cough, or hemoptysis.She denies any significant shortness of breath. She has had a stable pleural effusion which she is not very symptomatic from.Denies any nausea, vomiting, diarrhea, or constipation. Denies any headache or visual changes. The patient recently had a restaging CT scan. The patient is here today for evaluationand to review her scan results prior to starting cycle  #10.   MEDICAL HISTORY: Past Medical History:  Diagnosis Date  . Hyperlipidemia   . Hypertension   . Pleural effusion   . Pneumonia   . Stage IV adenocarcinoma of lung, left (HCC)     ALLERGIES:  is allergic to ace inhibitors.  MEDICATIONS:  Current Outpatient Medications  Medication Sig Dispense Refill  . cholecalciferol (VITAMIN D3) 25 MCG (1000 UNIT) tablet Take 2,000 Units by mouth daily.    . hydrOXYzine (ATARAX/VISTARIL) 10 MG tablet Take 1 tablet (10 mg total) by mouth 3 (three) times daily as needed. 90 tablet 2  . levothyroxine (SYNTHROID) 50 MCG tablet Take 1 tablet (50 mcg total) by mouth daily before breakfast. 30 tablet 2  . losartan-hydrochlorothiazide (HYZAAR) 100-12.5 MG tablet Take 1 tablet by mouth daily.    . metFORMIN (GLUCOPHAGE) 500 MG tablet Take 500 mg by mouth 2 (two) times daily with a meal.    . pravastatin (PRAVACHOL) 40 MG tablet Take 40 mg by mouth daily.    . sertraline (ZOLOFT) 50 MG tablet Take 75 mg by mouth daily.    . prochlorperazine (COMPAZINE) 10 MG tablet Take 1 tablet (10 mg total) by mouth every 6 (six) hours as needed for nausea or vomiting. (Patient not taking: Reported on 07/22/2020) 30 tablet 0   No current facility-administered medications for this visit.    SURGICAL HISTORY:  Past Surgical History:  Procedure Laterality Date  . NEPHRECTOMY Left     REVIEW OF SYSTEMS:   Review of Systems  Constitutional: Negative for appetite change, chills, fatigue, fever and unexpected weight change.  HENT: Negative for mouth sores, nosebleeds, sore throat and trouble swallowing.  Eyes: Negative for eye problems and icterus.  Respiratory: Negative for cough,  hemoptysis, shortness of breath and wheezing.  Cardiovascular: Negative for chest pain and leg swelling.  Gastrointestinal: Negative for abdominal pain, constipation, diarrhea, nausea and vomiting.  Genitourinary: Negative for bladder incontinence, difficulty urinating, dysuria,  frequency and hematuria.  Musculoskeletal: Positive for leg cramping. Negative for back pain, gait problem, neck pain and neck stiffness.  Skin: Positive for scattered skin lesions over arms and chest likely secondary to her immunotherapy.  Neurological: Negative for dizziness, extremity weakness, gait problem, headaches, light-headedness and seizures.  Hematological: Negative for adenopathy. Does not bruise/bleed easily.  Psychiatric/Behavioral: Negative for confusion, depression and sleep disturbance. The patient is not nervous/anxious.    PHYSICAL EXAMINATION:  Blood pressure 116/67, pulse 63, temperature 97.7 F (36.5 C), temperature source Tympanic, resp. rate 16, height 5' 4.5" (1.638 m), weight 168 lb 8 oz (76.4 kg), SpO2 97 %.  ECOG PERFORMANCE STATUS: 1 - Symptomatic but completely ambulatory  Physical Exam  Constitutional: Oriented to person, place, and time and well-developed, well-nourished, and in no distress.   HENT:  Head: Normocephalic and atraumatic.  Mouth/Throat: Oropharynx is clear and moist. No oropharyngeal exudate.  Eyes: Conjunctivae are normal. Right eye exhibits no discharge. Left eye exhibits no discharge. No scleral icterus.  Neck: Normal range of motion. Neck supple.  Cardiovascular: Normal rate, regular rhythm, normal heart sounds and intact distal pulses.   Pulmonary/Chest: Effort normal and breath sounds normal. No respiratory distress. No wheezes. No rales.  Abdominal: Soft. Bowel sounds are normal. Exhibits no distension and no mass. There is no tenderness.  Musculoskeletal: Normal range of motion. Exhibits no edema.  Lymphadenopathy:    No cervical adenopathy.  Neurological: Alert and oriented to person, place, and time. Exhibits normal muscle tone. Gait normal. Coordination normal.  Skin: Some dry scaly small scattered skin lesions on chest and extremities. Skin is warm and dry. Not diaphoretic. No erythema. No pallor.  Psychiatric: Mood, memory and  judgment normal.  Vitals reviewed.  LABORATORY DATA: Lab Results  Component Value Date   WBC 5.2 07/22/2020   HGB 11.2 (L) 07/22/2020   HCT 35.6 (L) 07/22/2020   MCV 84.8 07/22/2020   PLT 246 07/22/2020      Chemistry      Component Value Date/Time   NA 135 07/22/2020 0956   K 4.0 07/22/2020 0956   CL 103 07/22/2020 0956   CO2 25 07/22/2020 0956   BUN 34 (H) 07/22/2020 0956   CREATININE 1.11 (H) 07/22/2020 0956      Component Value Date/Time   CALCIUM 9.8 07/22/2020 0956   ALKPHOS 58 07/22/2020 0956   AST 13 (L) 07/22/2020 0956   ALT 8 07/22/2020 0956   BILITOT 0.3 07/22/2020 0956       RADIOGRAPHIC STUDIES:  CT Chest W Contrast  Result Date: 07/19/2020 CLINICAL DATA:  Primary Cancer Type: Lung Imaging Indication: Assess response to therapy Interval therapy since last imaging? Yes Initial Cancer Diagnosis Date: 11/27/2019; Established by: Biopsy-proven Detailed Pathology: Stage IV non-small cell lung cancer, adenocarcinoma. Primary Tumor location: Left lower lobe. Surgeries: Left nephrectomy. Chemotherapy: No Immunotherapy?  Yes; Type: Libtayo; Ongoing? Yes Radiation therapy? No EXAM: CT CHEST, ABDOMEN, AND PELVIS WITH CONTRAST TECHNIQUE: Multidetector CT imaging of the chest, abdomen and pelvis was performed following the standard protocol during bolus administration of intravenous contrast. CONTRAST:  74m OMNIPAQUE IOHEXOL 300 MG/ML SOLN additional oral enteric contrast COMPARISON:  Most recent CT chest, abdomen and pelvis 05/17/2020. 12/22/2019 PET-CT. FINDINGS: CT CHEST FINDINGS Cardiovascular: Aortic atherosclerosis. Normal heart size. Three-vessel coronary  artery calcifications no pericardial effusion. Mediastinum/Nodes: Unchanged retrosternal/epicardial lymph node or soft tissue nodule measuring 1.2 x 1.1 cm (series 2, image 32). No other enlarged mediastinal, hilar, or axillary lymph nodes. Thyroid gland, trachea, and esophagus demonstrate no significant findings.  Lungs/Pleura: Redemonstrated volume loss of the left lung. Small, loculated left pleural effusion with associated pleural thickening and atelectasis or consolidation, slightly diminished in volume compared to prior examination. Stable tiny right upper lobe pulmonary nodules, for example a 3 mm nodule of the peripheral right upper lobe (series 7, image 46). Musculoskeletal: No chest wall mass or suspicious bone lesions identified. CT ABDOMEN PELVIS FINDINGS Hepatobiliary: No solid liver abnormality is seen. No gallstones, gallbladder wall thickening, or biliary dilatation. Pancreas: Unremarkable. No pancreatic ductal dilatation or surrounding inflammatory changes. Spleen: Normal in size without significant abnormality. Adrenals/Urinary Tract: Adrenal glands are unremarkable. Status post left nephrectomy. The right kidney is normal. No calculi or hydronephrosis. Bladder is unremarkable. Stomach/Bowel: Stomach is within normal limits. Appendix appears normal. No evidence of bowel wall thickening, distention, or inflammatory changes. Vascular/Lymphatic: Aortic atherosclerosis. No enlarged abdominal or pelvic lymph nodes. Reproductive: No mass or other abnormality. Other: No abdominal wall hernia or abnormality. No abdominopelvic ascites. Musculoskeletal: No acute or significant osseous findings. IMPRESSION: 1. Redemonstrated volume loss of the left lung. Left lung mass not discretely appreciated. 2. Small, loculated left pleural effusion with associated pleural thickening and atelectasis or consolidation, slightly diminished in volume compared to prior examination. 3. Stable tiny pulmonary nodules of the right lung, almost certainly benign and incidental sequelae of prior infection or inflammation. Attention on follow-up. 4. Unchanged retrosternal/epicardial lymph node or soft tissue nodule measuring 1.2 x 1.1 cm. No other evidence of lymphadenopathy. 5. No evidence of metastatic disease in the abdomen or pelvis. 6.  Status post left nephrectomy. 7. Coronary artery disease. Aortic Atherosclerosis (ICD10-I70.0). Electronically Signed   By: Eddie Candle M.D.   On: 07/19/2020 10:37   CT Abdomen Pelvis W Contrast  Result Date: 07/19/2020 CLINICAL DATA:  Primary Cancer Type: Lung Imaging Indication: Assess response to therapy Interval therapy since last imaging? Yes Initial Cancer Diagnosis Date: 11/27/2019; Established by: Biopsy-proven Detailed Pathology: Stage IV non-small cell lung cancer, adenocarcinoma. Primary Tumor location: Left lower lobe. Surgeries: Left nephrectomy. Chemotherapy: No Immunotherapy?  Yes; Type: Libtayo; Ongoing? Yes Radiation therapy? No EXAM: CT CHEST, ABDOMEN, AND PELVIS WITH CONTRAST TECHNIQUE: Multidetector CT imaging of the chest, abdomen and pelvis was performed following the standard protocol during bolus administration of intravenous contrast. CONTRAST:  56m OMNIPAQUE IOHEXOL 300 MG/ML SOLN additional oral enteric contrast COMPARISON:  Most recent CT chest, abdomen and pelvis 05/17/2020. 12/22/2019 PET-CT. FINDINGS: CT CHEST FINDINGS Cardiovascular: Aortic atherosclerosis. Normal heart size. Three-vessel coronary artery calcifications no pericardial effusion. Mediastinum/Nodes: Unchanged retrosternal/epicardial lymph node or soft tissue nodule measuring 1.2 x 1.1 cm (series 2, image 32). No other enlarged mediastinal, hilar, or axillary lymph nodes. Thyroid gland, trachea, and esophagus demonstrate no significant findings. Lungs/Pleura: Redemonstrated volume loss of the left lung. Small, loculated left pleural effusion with associated pleural thickening and atelectasis or consolidation, slightly diminished in volume compared to prior examination. Stable tiny right upper lobe pulmonary nodules, for example a 3 mm nodule of the peripheral right upper lobe (series 7, image 46). Musculoskeletal: No chest wall mass or suspicious bone lesions identified. CT ABDOMEN PELVIS FINDINGS Hepatobiliary: No  solid liver abnormality is seen. No gallstones, gallbladder wall thickening, or biliary dilatation. Pancreas: Unremarkable. No pancreatic ductal dilatation or surrounding inflammatory changes. Spleen: Normal  in size without significant abnormality. Adrenals/Urinary Tract: Adrenal glands are unremarkable. Status post left nephrectomy. The right kidney is normal. No calculi or hydronephrosis. Bladder is unremarkable. Stomach/Bowel: Stomach is within normal limits. Appendix appears normal. No evidence of bowel wall thickening, distention, or inflammatory changes. Vascular/Lymphatic: Aortic atherosclerosis. No enlarged abdominal or pelvic lymph nodes. Reproductive: No mass or other abnormality. Other: No abdominal wall hernia or abnormality. No abdominopelvic ascites. Musculoskeletal: No acute or significant osseous findings. IMPRESSION: 1. Redemonstrated volume loss of the left lung. Left lung mass not discretely appreciated. 2. Small, loculated left pleural effusion with associated pleural thickening and atelectasis or consolidation, slightly diminished in volume compared to prior examination. 3. Stable tiny pulmonary nodules of the right lung, almost certainly benign and incidental sequelae of prior infection or inflammation. Attention on follow-up. 4. Unchanged retrosternal/epicardial lymph node or soft tissue nodule measuring 1.2 x 1.1 cm. No other evidence of lymphadenopathy. 5. No evidence of metastatic disease in the abdomen or pelvis. 6. Status post left nephrectomy. 7. Coronary artery disease. Aortic Atherosclerosis (ICD10-I70.0). Electronically Signed   By: Eddie Candle M.D.   On: 07/19/2020 10:37     ASSESSMENT/PLAN:  This is a very pleasant 65 year old Caucasian female diagnosed with stage IV (T3, N0, M1A) non-small cell lung cancer, adenocarcinoma. She presented with a large left lower lobe lung mass in addition to another lingular mass and a malignant left pleural effusion. She was diagnosed in  September 2021. Her molecular studies show that she has a PD-L1 expression at 95%. 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  The patient is currently undergoingtreatment with single agent Libtayo (Cempilimab) 350 mg IV every 3 weeks for up to 2 years unless the patient has evidence for disease progression or unacceptable toxicity.She is status post9cyclesand tolerated it well without any concerning adverse side effects.   The patient  Recently had a restaging CT scan performed. Dr. Julien Nordmann personally and independently reviewed the scan and discussed the results with the patient. The scan showed no evidence for disease progression.  Dr. Julien Nordmann recommends the patient continue with cycle #10 today as scheduled.   For the rash/itching, this is likely immunotherapy mediated. The rash sometimes itches. She has not tried taking any anti-histamines. She would like to continue on her immunotherapy. She will use hydrocortisone cream. I also sent a prescription for atarax to her pharmacy to take 10 mg TID PRN. If this is too expensive for her, she will just use benadryl instead. Of course, I cautioned this may make her feel drowsy so not to take this and then drive.   For the cramping, her electrolytes are WNL today. Encouraged her to stay hydrated  We will see her back for a follow up visit in 3 weeks for evaluation before starting cycle #11.   The patient was advised to call immediately if she has any concerning symptoms in the interval. The patient voices understanding of current disease status and treatment options and is in agreement with the current care plan. All questions were answered. The patient knows to call the clinic with any problems, questions or concerns. We can certainly see the patient much sooner if necessary      No orders of the defined types were placed in this encounter.     Elwyn Lowden L Rasaan [place],  PA-C 07/22/20  ADDENDUM: Hematology/oncology Attending: I had a face-to-face encounter with the patient today.  I reviewed her records, scan  and recommended her care plan.  This is a very pleasant 65 years old white female with stage IV non-small cell lung cancer, adenocarcinoma presented with large left lower lobe lung mass in addition to lingular mass and malignant left pleural effusion diagnosed in September 2021 was positive for K-ras G 12 C mutation and PD-L1 expression of 95%.  The patient is currently undergoing treatment with immunotherapy with single agent Libtayo (Cempilimab) status post 9 cycles.  She has been tolerating this treatment well with no significant adverse effect except for mild skin rash and pruritus. She apply hydrocortisone cream to the skin rash area but she does not take any antihistamine at this point. The patient had repeat CT scan of the chest, abdomen pelvis performed recently.  I personally and independently reviewed the scans and discussed the results with the patient and her husband. Her scan showed no concerning findings for disease progression. I recommended for her to continue her current treatment with Libtayo (Cempilimab) with the same dose. I will see her back for follow-up visit in 3 weeks for evaluation before the next cycle of her treatment. For the itching, she was advised to use Benadryl on as-needed basis. The patient was advised to call immediately if she has any other concerning symptoms in the interval. The total time spent in the appointment was 35 minutes. Disclaimer: This note was dictated with voice recognition software. Similar sounding words can inadvertently be transcribed and may be missed upon review. Eilleen Kempf, MD 07/22/20

## 2020-07-22 ENCOUNTER — Encounter: Payer: Self-pay | Admitting: Physician Assistant

## 2020-07-22 ENCOUNTER — Other Ambulatory Visit: Payer: Self-pay

## 2020-07-22 ENCOUNTER — Inpatient Hospital Stay (HOSPITAL_BASED_OUTPATIENT_CLINIC_OR_DEPARTMENT_OTHER): Payer: Medicare Other | Admitting: Physician Assistant

## 2020-07-22 ENCOUNTER — Ambulatory Visit: Payer: Medicare Other

## 2020-07-22 ENCOUNTER — Inpatient Hospital Stay: Payer: Medicare Other | Attending: Physician Assistant

## 2020-07-22 VITALS — BP 116/67 | HR 63 | Temp 97.7°F | Resp 16 | Ht 64.5 in | Wt 168.5 lb

## 2020-07-22 DIAGNOSIS — C3492 Malignant neoplasm of unspecified part of left bronchus or lung: Secondary | ICD-10-CM | POA: Diagnosis not present

## 2020-07-22 DIAGNOSIS — Z7984 Long term (current) use of oral hypoglycemic drugs: Secondary | ICD-10-CM | POA: Diagnosis not present

## 2020-07-22 DIAGNOSIS — Z905 Acquired absence of kidney: Secondary | ICD-10-CM | POA: Diagnosis not present

## 2020-07-22 DIAGNOSIS — Z5112 Encounter for antineoplastic immunotherapy: Secondary | ICD-10-CM | POA: Diagnosis present

## 2020-07-22 DIAGNOSIS — J910 Malignant pleural effusion: Secondary | ICD-10-CM | POA: Diagnosis not present

## 2020-07-22 DIAGNOSIS — I2510 Atherosclerotic heart disease of native coronary artery without angina pectoris: Secondary | ICD-10-CM | POA: Diagnosis not present

## 2020-07-22 DIAGNOSIS — C3432 Malignant neoplasm of lower lobe, left bronchus or lung: Secondary | ICD-10-CM | POA: Insufficient documentation

## 2020-07-22 DIAGNOSIS — Z79899 Other long term (current) drug therapy: Secondary | ICD-10-CM | POA: Insufficient documentation

## 2020-07-22 DIAGNOSIS — L299 Pruritus, unspecified: Secondary | ICD-10-CM

## 2020-07-22 DIAGNOSIS — R5382 Chronic fatigue, unspecified: Secondary | ICD-10-CM

## 2020-07-22 DIAGNOSIS — I10 Essential (primary) hypertension: Secondary | ICD-10-CM | POA: Insufficient documentation

## 2020-07-22 HISTORY — DX: Pruritus, unspecified: L29.9

## 2020-07-22 LAB — CMP (CANCER CENTER ONLY)
ALT: 8 U/L (ref 0–44)
AST: 13 U/L — ABNORMAL LOW (ref 15–41)
Albumin: 3.6 g/dL (ref 3.5–5.0)
Alkaline Phosphatase: 58 U/L (ref 38–126)
Anion gap: 7 (ref 5–15)
BUN: 34 mg/dL — ABNORMAL HIGH (ref 8–23)
CO2: 25 mmol/L (ref 22–32)
Calcium: 9.8 mg/dL (ref 8.9–10.3)
Chloride: 103 mmol/L (ref 98–111)
Creatinine: 1.11 mg/dL — ABNORMAL HIGH (ref 0.44–1.00)
GFR, Estimated: 55 mL/min — ABNORMAL LOW (ref >60)
Glucose, Bld: 223 mg/dL — ABNORMAL HIGH (ref 70–99)
Potassium: 4 mmol/L (ref 3.5–5.1)
Sodium: 135 mmol/L (ref 135–145)
Total Bilirubin: 0.3 mg/dL (ref 0.3–1.2)
Total Protein: 7.3 g/dL (ref 6.5–8.1)

## 2020-07-22 LAB — CBC WITH DIFFERENTIAL (CANCER CENTER ONLY)
Abs Immature Granulocytes: 0.03 10*3/uL (ref 0.00–0.07)
Basophils Absolute: 0.1 10*3/uL (ref 0.0–0.1)
Basophils Relative: 2 %
Eosinophils Absolute: 0.3 10*3/uL (ref 0.0–0.5)
Eosinophils Relative: 6 %
HCT: 35.6 % — ABNORMAL LOW (ref 36.0–46.0)
Hemoglobin: 11.2 g/dL — ABNORMAL LOW (ref 12.0–15.0)
Immature Granulocytes: 1 %
Lymphocytes Relative: 13 %
Lymphs Abs: 0.7 10*3/uL (ref 0.7–4.0)
MCH: 26.7 pg (ref 26.0–34.0)
MCHC: 31.5 g/dL (ref 30.0–36.0)
MCV: 84.8 fL (ref 80.0–100.0)
Monocytes Absolute: 0.3 10*3/uL (ref 0.1–1.0)
Monocytes Relative: 6 %
Neutro Abs: 3.8 10*3/uL (ref 1.7–7.7)
Neutrophils Relative %: 72 %
Platelet Count: 246 10*3/uL (ref 150–400)
RBC: 4.2 MIL/uL (ref 3.87–5.11)
RDW: 15.1 % (ref 11.5–15.5)
WBC Count: 5.2 10*3/uL (ref 4.0–10.5)
nRBC: 0 % (ref 0.0–0.2)

## 2020-07-22 LAB — TSH: TSH: 4.75 u[IU]/mL — ABNORMAL HIGH (ref 0.308–3.960)

## 2020-07-22 MED ORDER — SODIUM CHLORIDE 0.9% FLUSH
10.0000 mL | INTRAVENOUS | Status: DC | PRN
  Filled 2020-07-22: qty 10

## 2020-07-22 MED ORDER — HEPARIN SOD (PORK) LOCK FLUSH 100 UNIT/ML IV SOLN
500.0000 [IU] | Freq: Once | INTRAVENOUS | Status: DC | PRN
  Filled 2020-07-22: qty 5

## 2020-07-22 MED ORDER — HYDROXYZINE HCL 10 MG PO TABS: 10.0000 mg | ORAL_TABLET | Freq: Three times a day (TID) | ORAL | 2 refills | Status: DC | PRN

## 2020-07-22 MED ORDER — SODIUM CHLORIDE 0.9 % IV SOLN
Freq: Once | INTRAVENOUS | Status: AC
  Filled 2020-07-22: qty 250

## 2020-07-22 MED ORDER — SODIUM CHLORIDE 0.9 % IV SOLN
350.0000 mg | Freq: Once | INTRAVENOUS | Status: AC
  Administered 2020-07-22: 350 mg via INTRAVENOUS
  Filled 2020-07-22: qty 7

## 2020-07-25 ENCOUNTER — Telehealth: Payer: Self-pay | Admitting: Physician Assistant

## 2020-07-25 NOTE — Telephone Encounter (Signed)
Scheduled per los. Called and left msg about added appts. Mailed printout

## 2020-08-02 ENCOUNTER — Other Ambulatory Visit: Payer: Self-pay | Admitting: Internal Medicine

## 2020-08-06 ENCOUNTER — Encounter: Payer: Self-pay | Admitting: Internal Medicine

## 2020-08-07 NOTE — Progress Notes (Signed)
[facility] OFFICE PROGRESS NOTE  Gabrielle Dandy, NP [address] [place] [address]  DIAGNOSIS: Stage IV (T3, N0, M1 a) non-small cell lung cancer, adenocarcinoma presented with large left lower lobe lung mass in addition to another lingual mass and malignant left pleural effusion diagnosed in September 2021  Biomarker Findings Tumor Mutational Burden - 13 Muts/Mb Microsatellite status - MS-Stable Genomic Findings For a complete list of the genes assayed, please refer to the Appendix. KRAS G12C PRDM1 D227f*46 TERT promoter -146C>T TP53 V157F 7 Disease relevant genes with no reportable alterations: ALK, BRAF, EGFR, ERBB2, MET, RET, ROS1   PDL1 Expression: 95%  PRIOR THERAPY: None  CURRENT THERAPY: Libtayo (Cempilimab) 350 mg IV every 3 weeks. First dose January 15, 2020.Status post10cycles  [name] 65y.o. female returns to the clinic for a follow up visitaccompanied by her husband. The patient is feeling well today without any concerning complaints. The patient continues to tolerate treatment withimmunotherapy with Libtayowell without any adverse sideeffectsexcept she does have some scattered skin lesions which are consistent with immunotherapy mediated.These do not bother her and she uses hydrocortisone if needed. She also takes benadryl if needed. Denies any fever, chills, night sweats, or weight loss. Denies any chest pain, cough, or hemoptysis.She denies any significant shortness of breath. She has had a stable pleural effusion which she is not very symptomatic from.Denies any nausea, vomiting, diarrhea, or constipation. Denies any headache or visual changes. The patient is here today for evaluationprior to starting cycle #11.  MEDICAL HISTORY: Past Medical History:  Diagnosis Date  . Hyperlipidemia   . Hypertension   . Pleural effusion   . Pneumonia   . Stage IV adenocarcinoma of lung, left (HCC)      ALLERGIES:  is allergic to ace inhibitors.  MEDICATIONS:  Current Outpatient Medications  Medication Sig Dispense Refill  . cholecalciferol (VITAMIN D3) 25 MCG (1000 UNIT) tablet Take 2,000 Units by mouth daily.    .Marland Kitchenlevothyroxine (SYNTHROID) 50 MCG tablet TAKE 1 TABLET BY MOUTH DAILY BEFORE BREAKFAST 30 tablet 2  . losartan-hydrochlorothiazide (HYZAAR) 100-12.5 MG tablet Take 1 tablet by mouth daily.    . metFORMIN (GLUCOPHAGE) 500 MG tablet Take 500 mg by mouth 2 (two) times daily with a meal.    . pravastatin (PRAVACHOL) 40 MG tablet Take 40 mg by mouth daily.    . sertraline (ZOLOFT) 50 MG tablet Take 75 mg by mouth daily.    . prochlorperazine (COMPAZINE) 10 MG tablet Take 1 tablet (10 mg total) by mouth every 6 (six) hours as needed for nausea or vomiting. (Patient not taking: Reported on 08/13/2020) 30 tablet 0   No current facility-administered medications for this visit.    SURGICAL HISTORY:  Past Surgical History:  Procedure Laterality Date  . NEPHRECTOMY Left     REVIEW OF SYSTEMS:   Review of Systems  Constitutional: Negative for appetite change, chills, fatigue, fever and unexpected weight change.  HENT:   Negative for mouth sores, nosebleeds, sore throat and trouble swallowing.   Eyes: Negative for eye problems and icterus.  Respiratory: Negative for cough, hemoptysis, shortness of breath and wheezing.   Cardiovascular: Negative for chest pain and leg swelling.  Gastrointestinal: Negative for abdominal pain, constipation, diarrhea, nausea and vomiting.  Genitourinary: Negative for bladder incontinence, difficulty urinating, dysuria, frequency and hematuria.   Musculoskeletal: Negative for back pain, gait problem, neck pain and neck stiffness.  Skin:Positive for scattered skin lesions over  arms and chest likely secondary to her immunotherapy. Neurological: Negative for dizziness, extremity weakness, gait problem, headaches, light-headedness and seizures.   Hematological: Negative for adenopathy. Does not bruise/bleed easily.  Psychiatric/Behavioral: Negative for confusion, depression and sleep disturbance. The patient is not nervous/anxious.     PHYSICAL EXAMINATION:  Blood pressure 106/70, pulse (!) 58, temperature (!) 97.2 F (36.2 C), temperature source Tympanic, resp. rate 18, weight 166 lb 11.2 oz (75.6 kg), SpO2 99 %.  ECOG PERFORMANCE STATUS: 1 - Symptomatic but completely ambulatory  Physical Exam  Constitutional: Oriented to person, place, and time and well-developed, well-nourished, and in no distress.   HENT:  Head: Normocephalic and atraumatic.  Mouth/Throat: Oropharynx is clear and moist. No oropharyngeal exudate.  Eyes: Conjunctivae are normal. Right eye exhibits no discharge. Left eye exhibits no discharge. No scleral icterus.  Neck: Normal range of motion. Neck supple.  Cardiovascular: Normal rate, regular rhythm, normal heart sounds and intact distal pulses.   Pulmonary/Chest: Effort normal and breath sounds normal. No respiratory distress. No wheezes. No rales.  Abdominal: Soft. Bowel sounds are normal. Exhibits no distension and no mass. There is no tenderness.  Musculoskeletal: Normal range of motion. Exhibits no edema.  Lymphadenopathy:    No cervical adenopathy.  Neurological: Alert and oriented to person, place, and time. Exhibits normal muscle tone. Gait normal. Coordination normal.  Skin: Some dry scaly small scattered skin lesions on chest and extremities. Skin is warm and dry. Not diaphoretic. No erythema. No pallor.  Psychiatric: Mood, memory and judgment normal.  Vitals reviewed.  LABORATORY DATA: Lab Results  Component Value Date   WBC 4.7 08/13/2020   HGB 11.5 (L) 08/13/2020   HCT 36.2 08/13/2020   MCV 85.2 08/13/2020   PLT 214 08/13/2020      Chemistry      Component Value Date/Time   NA 136 08/13/2020 0932   K 3.9 08/13/2020 0932   CL 102 08/13/2020 0932   CO2 22 08/13/2020 0932   BUN 31  (H) 08/13/2020 0932   CREATININE 1.16 (H) 08/13/2020 0932      Component Value Date/Time   CALCIUM 9.8 08/13/2020 0932   ALKPHOS 64 08/13/2020 0932   AST 12 (L) 08/13/2020 0932   ALT 8 08/13/2020 0932   BILITOT 0.3 08/13/2020 0932       RADIOGRAPHIC STUDIES:  CT Chest W Contrast  Result Date: 07/19/2020 CLINICAL DATA:  Primary Cancer Type: Lung Imaging Indication: Assess response to therapy Interval therapy since last imaging? Yes Initial Cancer Diagnosis Date: 11/27/2019; Established by: Biopsy-proven Detailed Pathology: Stage IV non-small cell lung cancer, adenocarcinoma. Primary Tumor location: Left lower lobe. Surgeries: Left nephrectomy. Chemotherapy: No Immunotherapy?  Yes; Type: Libtayo; Ongoing? Yes Radiation therapy? No EXAM: CT CHEST, ABDOMEN, AND PELVIS WITH CONTRAST TECHNIQUE: Multidetector CT imaging of the chest, abdomen and pelvis was performed following the standard protocol during bolus administration of intravenous contrast. CONTRAST:  67m OMNIPAQUE IOHEXOL 300 MG/ML SOLN additional oral enteric contrast COMPARISON:  Most recent CT chest, abdomen and pelvis 05/17/2020. 12/22/2019 PET-CT. FINDINGS: CT CHEST FINDINGS Cardiovascular: Aortic atherosclerosis. Normal heart size. Three-vessel coronary artery calcifications no pericardial effusion. Mediastinum/Nodes: Unchanged retrosternal/epicardial lymph node or soft tissue nodule measuring 1.2 x 1.1 cm (series 2, image 32). No other enlarged mediastinal, hilar, or axillary lymph nodes. Thyroid gland, trachea, and esophagus demonstrate no significant findings. Lungs/Pleura: Redemonstrated volume loss of the left lung. Small, loculated left pleural effusion with associated pleural thickening and atelectasis or consolidation, slightly diminished in volume compared  to prior examination. Stable tiny right upper lobe pulmonary nodules, for example a 3 mm nodule of the peripheral right upper lobe (series 7, image 46). Musculoskeletal: No  chest wall mass or suspicious bone lesions identified. CT ABDOMEN PELVIS FINDINGS Hepatobiliary: No solid liver abnormality is seen. No gallstones, gallbladder wall thickening, or biliary dilatation. Pancreas: Unremarkable. No pancreatic ductal dilatation or surrounding inflammatory changes. Spleen: Normal in size without significant abnormality. Adrenals/Urinary Tract: Adrenal glands are unremarkable. Status post left nephrectomy. The right kidney is normal. No calculi or hydronephrosis. Bladder is unremarkable. Stomach/Bowel: Stomach is within normal limits. Appendix appears normal. No evidence of bowel wall thickening, distention, or inflammatory changes. Vascular/Lymphatic: Aortic atherosclerosis. No enlarged abdominal or pelvic lymph nodes. Reproductive: No mass or other abnormality. Other: No abdominal wall hernia or abnormality. No abdominopelvic ascites. Musculoskeletal: No acute or significant osseous findings. IMPRESSION: 1. Redemonstrated volume loss of the left lung. Left lung mass not discretely appreciated. 2. Small, loculated left pleural effusion with associated pleural thickening and atelectasis or consolidation, slightly diminished in volume compared to prior examination. 3. Stable tiny pulmonary nodules of the right lung, almost certainly benign and incidental sequelae of prior infection or inflammation. Attention on follow-up. 4. Unchanged retrosternal/epicardial lymph node or soft tissue nodule measuring 1.2 x 1.1 cm. No other evidence of lymphadenopathy. 5. No evidence of metastatic disease in the abdomen or pelvis. 6. Status post left nephrectomy. 7. Coronary artery disease. Aortic Atherosclerosis (ICD10-I70.0). Electronically Signed   By: Eddie Candle M.D.   On: 07/19/2020 10:37   CT Abdomen Pelvis W Contrast  Result Date: 07/19/2020 CLINICAL DATA:  Primary Cancer Type: Lung Imaging Indication: Assess response to therapy Interval therapy since last imaging? Yes Initial Cancer Diagnosis  Date: 11/27/2019; Established by: Biopsy-proven Detailed Pathology: Stage IV non-small cell lung cancer, adenocarcinoma. Primary Tumor location: Left lower lobe. Surgeries: Left nephrectomy. Chemotherapy: No Immunotherapy?  Yes; Type: Libtayo; Ongoing? Yes Radiation therapy? No EXAM: CT CHEST, ABDOMEN, AND PELVIS WITH CONTRAST TECHNIQUE: Multidetector CT imaging of the chest, abdomen and pelvis was performed following the standard protocol during bolus administration of intravenous contrast. CONTRAST:  55m OMNIPAQUE IOHEXOL 300 MG/ML SOLN additional oral enteric contrast COMPARISON:  Most recent CT chest, abdomen and pelvis 05/17/2020. 12/22/2019 PET-CT. FINDINGS: CT CHEST FINDINGS Cardiovascular: Aortic atherosclerosis. Normal heart size. Three-vessel coronary artery calcifications no pericardial effusion. Mediastinum/Nodes: Unchanged retrosternal/epicardial lymph node or soft tissue nodule measuring 1.2 x 1.1 cm (series 2, image 32). No other enlarged mediastinal, hilar, or axillary lymph nodes. Thyroid gland, trachea, and esophagus demonstrate no significant findings. Lungs/Pleura: Redemonstrated volume loss of the left lung. Small, loculated left pleural effusion with associated pleural thickening and atelectasis or consolidation, slightly diminished in volume compared to prior examination. Stable tiny right upper lobe pulmonary nodules, for example a 3 mm nodule of the peripheral right upper lobe (series 7, image 46). Musculoskeletal: No chest wall mass or suspicious bone lesions identified. CT ABDOMEN PELVIS FINDINGS Hepatobiliary: No solid liver abnormality is seen. No gallstones, gallbladder wall thickening, or biliary dilatation. Pancreas: Unremarkable. No pancreatic ductal dilatation or surrounding inflammatory changes. Spleen: Normal in size without significant abnormality. Adrenals/Urinary Tract: Adrenal glands are unremarkable. Status post left nephrectomy. The right kidney is normal. No calculi or  hydronephrosis. Bladder is unremarkable. Stomach/Bowel: Stomach is within normal limits. Appendix appears normal. No evidence of bowel wall thickening, distention, or inflammatory changes. Vascular/Lymphatic: Aortic atherosclerosis. No enlarged abdominal or pelvic lymph nodes. Reproductive: No mass or other abnormality. Other: No abdominal wall hernia  or abnormality. No abdominopelvic ascites. Musculoskeletal: No acute or significant osseous findings. IMPRESSION: 1. Redemonstrated volume loss of the left lung. Left lung mass not discretely appreciated. 2. Small, loculated left pleural effusion with associated pleural thickening and atelectasis or consolidation, slightly diminished in volume compared to prior examination. 3. Stable tiny pulmonary nodules of the right lung, almost certainly benign and incidental sequelae of prior infection or inflammation. Attention on follow-up. 4. Unchanged retrosternal/epicardial lymph node or soft tissue nodule measuring 1.2 x 1.1 cm. No other evidence of lymphadenopathy. 5. No evidence of metastatic disease in the abdomen or pelvis. 6. Status post left nephrectomy. 7. Coronary artery disease. Aortic Atherosclerosis (ICD10-I70.0). Electronically Signed   By: Eddie Candle M.D.   On: 07/19/2020 10:37     ASSESSMENT/PLAN:  This is a very pleasant 65 year old Caucasian female diagnosed with stage IV (T3, N0, M1A) non-small cell lung cancer, adenocarcinoma. She presented with a large left lower lobe lung mass in addition to another lingular mass and a malignant left pleural effusion. She was diagnosed in September 2021. Her molecular studies show that she has a PD-L1 expression at 95%. 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  The patient is currently undergoingtreatment with single agent Libtayo (Cempilimab) 350 mg IV every 3 weeks for up to 2 years unless the patient has evidence for disease progression or  unacceptable toxicity.She is status post10cyclesand tolerated it well without any concerning adverse side effects.    Labs were reviewed. Recommend that she proceed with cycle #11 today as scheduled.   We will see her back for a follow up visit in 3 weeks for evaluation before starting cycle #12.   She will continue to use hydrocortisone cream if needed for her rash and benadrl if needed.  The patient was advised to call immediately if she has any concerning symptoms in the interval. The patient voices understanding of current disease status and treatment options and is in agreement with the current care plan. All questions were answered. The patient knows to call the clinic with any problems, questions or concerns. We can certainly see the patient much sooner if necessary     No orders of the defined types were placed in this encounter.    I spent 20-29 minutes in this encounter  Gilmar Bua L Rayvon Dakin, PA-C 08/13/20

## 2020-08-12 ENCOUNTER — Other Ambulatory Visit: Payer: Medicare Other

## 2020-08-12 ENCOUNTER — Ambulatory Visit: Payer: Medicare Other | Admitting: Internal Medicine

## 2020-08-12 ENCOUNTER — Ambulatory Visit: Payer: Medicare Other

## 2020-08-13 ENCOUNTER — Other Ambulatory Visit: Payer: Self-pay

## 2020-08-13 ENCOUNTER — Inpatient Hospital Stay (HOSPITAL_BASED_OUTPATIENT_CLINIC_OR_DEPARTMENT_OTHER): Payer: Medicare Other | Admitting: Physician Assistant

## 2020-08-13 ENCOUNTER — Inpatient Hospital Stay: Payer: Medicare Other

## 2020-08-13 ENCOUNTER — Encounter: Payer: Self-pay | Admitting: Physician Assistant

## 2020-08-13 ENCOUNTER — Inpatient Hospital Stay: Payer: Medicare Other | Attending: Physician Assistant

## 2020-08-13 VITALS — BP 106/70 | HR 58 | Temp 97.2°F | Resp 18 | Wt 166.7 lb

## 2020-08-13 DIAGNOSIS — Z7984 Long term (current) use of oral hypoglycemic drugs: Secondary | ICD-10-CM | POA: Insufficient documentation

## 2020-08-13 DIAGNOSIS — I10 Essential (primary) hypertension: Secondary | ICD-10-CM | POA: Diagnosis not present

## 2020-08-13 DIAGNOSIS — Z79899 Other long term (current) drug therapy: Secondary | ICD-10-CM | POA: Diagnosis not present

## 2020-08-13 DIAGNOSIS — C3432 Malignant neoplasm of lower lobe, left bronchus or lung: Secondary | ICD-10-CM | POA: Insufficient documentation

## 2020-08-13 DIAGNOSIS — Z905 Acquired absence of kidney: Secondary | ICD-10-CM | POA: Insufficient documentation

## 2020-08-13 DIAGNOSIS — E785 Hyperlipidemia, unspecified: Secondary | ICD-10-CM | POA: Diagnosis not present

## 2020-08-13 DIAGNOSIS — Z5112 Encounter for antineoplastic immunotherapy: Secondary | ICD-10-CM | POA: Insufficient documentation

## 2020-08-13 DIAGNOSIS — C3492 Malignant neoplasm of unspecified part of left bronchus or lung: Secondary | ICD-10-CM

## 2020-08-13 DIAGNOSIS — R5382 Chronic fatigue, unspecified: Secondary | ICD-10-CM

## 2020-08-13 LAB — CBC WITH DIFFERENTIAL (CANCER CENTER ONLY)
Abs Immature Granulocytes: 0.01 10*3/uL (ref 0.00–0.07)
Basophils Absolute: 0.1 10*3/uL (ref 0.0–0.1)
Basophils Relative: 1 %
Eosinophils Absolute: 0.3 10*3/uL (ref 0.0–0.5)
Eosinophils Relative: 7 %
HCT: 36.2 % (ref 36.0–46.0)
Hemoglobin: 11.5 g/dL — ABNORMAL LOW (ref 12.0–15.0)
Immature Granulocytes: 0 %
Lymphocytes Relative: 14 %
Lymphs Abs: 0.7 10*3/uL (ref 0.7–4.0)
MCH: 27.1 pg (ref 26.0–34.0)
MCHC: 31.8 g/dL (ref 30.0–36.0)
MCV: 85.2 fL (ref 80.0–100.0)
Monocytes Absolute: 0.3 10*3/uL (ref 0.1–1.0)
Monocytes Relative: 7 %
Neutro Abs: 3.4 10*3/uL (ref 1.7–7.7)
Neutrophils Relative %: 71 %
Platelet Count: 214 10*3/uL (ref 150–400)
RBC: 4.25 MIL/uL (ref 3.87–5.11)
RDW: 15.5 % (ref 11.5–15.5)
WBC Count: 4.7 10*3/uL (ref 4.0–10.5)
nRBC: 0 % (ref 0.0–0.2)

## 2020-08-13 LAB — CMP (CANCER CENTER ONLY)
ALT: 8 U/L (ref 0–44)
AST: 12 U/L — ABNORMAL LOW (ref 15–41)
Albumin: 3.5 g/dL (ref 3.5–5.0)
Alkaline Phosphatase: 64 U/L (ref 38–126)
Anion gap: 12 (ref 5–15)
BUN: 31 mg/dL — ABNORMAL HIGH (ref 8–23)
CO2: 22 mmol/L (ref 22–32)
Calcium: 9.8 mg/dL (ref 8.9–10.3)
Chloride: 102 mmol/L (ref 98–111)
Creatinine: 1.16 mg/dL — ABNORMAL HIGH (ref 0.44–1.00)
GFR, Estimated: 52 mL/min — ABNORMAL LOW (ref >60)
Glucose, Bld: 283 mg/dL — ABNORMAL HIGH (ref 70–99)
Potassium: 3.9 mmol/L (ref 3.5–5.1)
Sodium: 136 mmol/L (ref 135–145)
Total Bilirubin: 0.3 mg/dL (ref 0.3–1.2)
Total Protein: 7.4 g/dL (ref 6.5–8.1)

## 2020-08-13 LAB — TSH: TSH: 5.668 u[IU]/mL — ABNORMAL HIGH (ref 0.308–3.960)

## 2020-08-13 MED ORDER — SODIUM CHLORIDE 0.9 % IV SOLN
350.0000 mg | Freq: Once | INTRAVENOUS | Status: AC
  Administered 2020-08-13: 350 mg via INTRAVENOUS
  Filled 2020-08-13: qty 7

## 2020-08-13 MED ORDER — SODIUM CHLORIDE 0.9 % IV SOLN
Freq: Once | INTRAVENOUS | Status: AC
  Filled 2020-08-13: qty 250

## 2020-08-13 NOTE — Patient Instructions (Signed)
[facility]  Discharge Instructions: Thank you for choosing [facility] to provide your oncology and hematology care.   If you have a lab appointment with the [facility], please go directly to the [facility] and check in at the registration area.   Wear comfortable clothing and clothing appropriate for easy access to any Portacath or PICC line.   We strive to give you quality time with your provider. You may need to reschedule your appointment if you arrive late (15 or more minutes).  Arriving late affects you and other patients whose appointments are after yours.  Also, if you miss three or more appointments without notifying the office, you may be dismissed from the clinic at the provider's discretion.      For prescription refill requests, have your pharmacy contact our office and allow 72 hours for refills to be completed.    Today you received the following chemotherapy and/or immunotherapy agent: Cemiplimab (Libtayo)   To help prevent nausea and vomiting after your treatment, we encourage you to take your nausea medication as directed.  BELOW ARE SYMPTOMS THAT SHOULD BE REPORTED IMMEDIATELY: . *FEVER GREATER THAN 100.4 F (38 C) OR HIGHER . *CHILLS OR SWEATING . *NAUSEA AND VOMITING THAT IS NOT CONTROLLED WITH YOUR NAUSEA MEDICATION . *UNUSUAL SHORTNESS OF BREATH . *UNUSUAL BRUISING OR BLEEDING . *URINARY PROBLEMS (pain or burning when urinating, or frequent urination) . *BOWEL PROBLEMS (unusual diarrhea, constipation, pain near the anus) . TENDERNESS IN MOUTH AND THROAT WITH OR WITHOUT PRESENCE OF ULCERS (sore throat, sores in mouth, or a toothache) . UNUSUAL RASH, SWELLING OR PAIN  . UNUSUAL VAGINAL DISCHARGE OR ITCHING   Items with * indicate a potential emergency and should be followed up as soon as possible or go to the Emergency Department if any problems should occur.  Please show the CHEMOTHERAPY ALERT CARD or IMMUNOTHERAPY  ALERT CARD at check-in to the Emergency Department and triage nurse.  Should you have questions after your visit or need to cancel or reschedule your appointment, please contact [facility]  Dept: [phone]  and follow the prompts.  Office hours are 8:00 a.m. to 4:30 p.m. Monday - Friday. Please note that voicemails left after 4:00 p.m. may not be returned until the following business day.  We are closed weekends and major holidays. You have access to a nurse at all times for urgent questions. Please call the main number to the clinic Dept: [phone] and follow the prompts.   For any non-urgent questions, you may also contact your provider using MyChart. We now offer e-Visits for anyone 8 and older to request care online for non-urgent symptoms. For details visit [email or web address].   Also download the MyChart app! Go to the app store, search "MyChart", open the app, select [facility], and log in with your MyChart username and password.  Due to Covid, a mask is required upon entering the hospital/clinic. If you do not have a mask, one will be given to you upon arrival. For doctor visits, patients may have 1 support person aged 25 or older with them. For treatment visits, patients cannot have anyone with them due to current Covid guidelines and our immunocompromised population.    Hypokalemia Hypokalemia means that the amount of potassium in the blood is lower than normal. Potassium is a chemical (electrolyte) that helps regulate the amount of fluid in the body. It also stimulates muscle tightening (contraction) and helps nerves work  properly. Normally, most of the body's potassium is inside cells, and only a very small amount is in the blood. Because the amount in the blood is so small, minor changes to potassium levels in the blood can be life-threatening. What are the causes? This condition may be caused by:  Antibiotic medicine.  Diarrhea or vomiting.  Taking too much of a medicine that helps you have a bowel movement (laxative) can cause diarrhea and lead to hypokalemia.  Chronic kidney disease (CKD).  Medicines that help the body get rid of excess fluid (diuretics).  Eating disorders, such as bulimia.  Low magnesium levels in the body.  Sweating a lot. What are the signs or symptoms? Symptoms of this condition include:  Weakness.  Constipation.  Fatigue.  Muscle cramps.  Mental confusion.  Skipped heartbeats or irregular heartbeat (palpitations).  Tingling or numbness. How is this diagnosed? This condition is diagnosed with a blood test. How is this treated? This condition may be treated by:  Taking potassium supplements by mouth.  Adjusting the medicines that you take.  Eating more foods that contain a lot of potassium. If your potassium level is very low, you may need to get potassium through an IV and be monitored in the hospital. Follow these instructions at home:  Take over-the-counter and prescription medicines only as told by your health care provider. This includes vitamins and supplements.  Eat a healthy diet. A healthy diet includes fresh fruits and vegetables, whole grains, healthy fats, and lean proteins.  If instructed, eat more foods that contain a lot of potassium. This includes: ? Nuts, such as peanuts and pistachios. ? Seeds, such as sunflower seeds and pumpkin seeds. ? Peas, lentils, and lima beans. ? Whole grain and bran cereals and breads. ? Fresh fruits and vegetables, such as apricots, avocado, bananas, cantaloupe, kiwi, oranges, tomatoes, asparagus, and potatoes. ? Orange juice. ? Tomato juice. ? Red meats. ? Yogurt.  Keep all follow-up visits as told by your health care provider. This is important.   Contact a health care provider if you:  Have weakness that gets worse.  Feel your heart pounding or racing.  Vomit.  Have diarrhea.  Have diabetes (diabetes mellitus) and you  have trouble keeping your blood sugar (glucose) in your target range. Get help right away if you:  Have chest pain.  Have shortness of breath.  Have vomiting or diarrhea that lasts for more than 2 days.  Faint. Summary  Hypokalemia means that the amount of potassium in the blood is lower than normal.  This condition is diagnosed with a blood test.  Hypokalemia may be treated by taking potassium supplements, adjusting the medicines that you take, or eating more foods that are high in potassium.  If your potassium level is very low, you may need to get potassium through an IV and be monitored in the hospital. This information is not intended to replace advice given to you by your health care provider. Make sure you discuss any questions you have with your health care provider. Document Revised: 10/06/2017 Document Reviewed: 10/06/2017 Elsevier Patient Education  [employer].

## 2020-09-03 ENCOUNTER — Inpatient Hospital Stay: Payer: Medicare Other

## 2020-09-03 ENCOUNTER — Other Ambulatory Visit: Payer: Self-pay

## 2020-09-03 ENCOUNTER — Inpatient Hospital Stay (HOSPITAL_BASED_OUTPATIENT_CLINIC_OR_DEPARTMENT_OTHER): Payer: Medicare Other | Admitting: Internal Medicine

## 2020-09-03 VITALS — BP 118/70 | HR 57 | Temp 97.6°F | Resp 18 | Ht 64.0 in | Wt 164.5 lb

## 2020-09-03 DIAGNOSIS — C3492 Malignant neoplasm of unspecified part of left bronchus or lung: Secondary | ICD-10-CM

## 2020-09-03 DIAGNOSIS — Z5112 Encounter for antineoplastic immunotherapy: Secondary | ICD-10-CM | POA: Diagnosis not present

## 2020-09-03 DIAGNOSIS — R5382 Chronic fatigue, unspecified: Secondary | ICD-10-CM

## 2020-09-03 DIAGNOSIS — C3490 Malignant neoplasm of unspecified part of unspecified bronchus or lung: Secondary | ICD-10-CM

## 2020-09-03 LAB — CMP (CANCER CENTER ONLY)
ALT: 12 U/L (ref 0–44)
AST: 15 U/L (ref 15–41)
Albumin: 3.4 g/dL — ABNORMAL LOW (ref 3.5–5.0)
Alkaline Phosphatase: 57 U/L (ref 38–126)
Anion gap: 11 (ref 5–15)
BUN: 27 mg/dL — ABNORMAL HIGH (ref 8–23)
CO2: 21 mmol/L — ABNORMAL LOW (ref 22–32)
Calcium: 9.2 mg/dL (ref 8.9–10.3)
Chloride: 106 mmol/L (ref 98–111)
Creatinine: 1 mg/dL (ref 0.44–1.00)
GFR, Estimated: >60 mL/min (ref >60)
Glucose, Bld: 133 mg/dL — ABNORMAL HIGH (ref 70–99)
Potassium: 3.8 mmol/L (ref 3.5–5.1)
Sodium: 138 mmol/L (ref 135–145)
Total Bilirubin: <0.2 mg/dL — ABNORMAL LOW (ref 0.3–1.2)
Total Protein: 7.1 g/dL (ref 6.5–8.1)

## 2020-09-03 LAB — CBC WITH DIFFERENTIAL (CANCER CENTER ONLY)
Abs Immature Granulocytes: 0.01 10*3/uL (ref 0.00–0.07)
Basophils Absolute: 0.1 10*3/uL (ref 0.0–0.1)
Basophils Relative: 1 %
Eosinophils Absolute: 0.4 10*3/uL (ref 0.0–0.5)
Eosinophils Relative: 6 %
HCT: 34.7 % — ABNORMAL LOW (ref 36.0–46.0)
Hemoglobin: 11.4 g/dL — ABNORMAL LOW (ref 12.0–15.0)
Immature Granulocytes: 0 %
Lymphocytes Relative: 10 %
Lymphs Abs: 0.6 10*3/uL — ABNORMAL LOW (ref 0.7–4.0)
MCH: 27.3 pg (ref 26.0–34.0)
MCHC: 32.9 g/dL (ref 30.0–36.0)
MCV: 83 fL (ref 80.0–100.0)
Monocytes Absolute: 0.4 10*3/uL (ref 0.1–1.0)
Monocytes Relative: 7 %
Neutro Abs: 4.3 10*3/uL (ref 1.7–7.7)
Neutrophils Relative %: 76 %
Platelet Count: 226 10*3/uL (ref 150–400)
RBC: 4.18 MIL/uL (ref 3.87–5.11)
RDW: 15.2 % (ref 11.5–15.5)
WBC Count: 5.7 10*3/uL (ref 4.0–10.5)
nRBC: 0 % (ref 0.0–0.2)

## 2020-09-03 LAB — TSH: TSH: 2.128 u[IU]/mL (ref 0.308–3.960)

## 2020-09-03 MED ORDER — SODIUM CHLORIDE 0.9 % IV SOLN
Freq: Once | INTRAVENOUS | Status: AC
  Filled 2020-09-03: qty 250

## 2020-09-03 MED ORDER — SODIUM CHLORIDE 0.9 % IV SOLN
350.0000 mg | Freq: Once | INTRAVENOUS | Status: AC
  Administered 2020-09-03: 350 mg via INTRAVENOUS
  Filled 2020-09-03: qty 7

## 2020-09-03 NOTE — Patient Instructions (Signed)
[facility]  Discharge Instructions: Thank you for choosing [facility] to provide your oncology and hematology care.   If you have a lab appointment with the [facility], please go directly to the [facility] and check in at the registration area.   Wear comfortable clothing and clothing appropriate for easy access to any Portacath or PICC line.   We strive to give you quality time with your provider. You may need to reschedule your appointment if you arrive late (15 or more minutes).  Arriving late affects you and other patients whose appointments are after yours.  Also, if you miss three or more appointments without notifying the office, you may be dismissed from the clinic at the provider's discretion.      For prescription refill requests, have your pharmacy contact our office and allow 72 hours for refills to be completed.    Today you received the following chemotherapy and/or immunotherapy agents Cemiplimab-rwlc (Libtayo)      To help prevent nausea and vomiting after your treatment, we encourage you to take your nausea medication as directed.  BELOW ARE SYMPTOMS THAT SHOULD BE REPORTED IMMEDIATELY: *FEVER GREATER THAN 100.4 F (38 C) OR HIGHER *CHILLS OR SWEATING *NAUSEA AND VOMITING THAT IS NOT CONTROLLED WITH YOUR NAUSEA MEDICATION *UNUSUAL SHORTNESS OF BREATH *UNUSUAL BRUISING OR BLEEDING *URINARY PROBLEMS (pain or burning when urinating, or frequent urination) *BOWEL PROBLEMS (unusual diarrhea, constipation, pain near the anus) TENDERNESS IN MOUTH AND THROAT WITH OR WITHOUT PRESENCE OF ULCERS (sore throat, sores in mouth, or a toothache) UNUSUAL RASH, SWELLING OR PAIN  UNUSUAL VAGINAL DISCHARGE OR ITCHING   Items with * indicate a potential emergency and should be followed up as soon as possible or go to the Emergency Department if any problems should occur.  Please show the CHEMOTHERAPY ALERT CARD or IMMUNOTHERAPY ALERT CARD  at check-in to the Emergency Department and triage nurse.  Should you have questions after your visit or need to cancel or reschedule your appointment, please contact [facility]  Dept: [phone]  and follow the prompts.  Office hours are 8:00 a.m. to 4:30 p.m. Monday - Friday. Please note that voicemails left after 4:00 p.m. may not be returned until the following business day.  We are closed weekends and major holidays. You have access to a nurse at all times for urgent questions. Please call the main number to the clinic Dept: [phone] and follow the prompts.   For any non-urgent questions, you may also contact your provider using MyChart. We now offer e-Visits for anyone 76 and older to request care online for non-urgent symptoms. For details visit [email or web address].   Also download the MyChart app! Go to the app store, search "MyChart", open the app, select , and log in with your MyChart username and password.  Due to Covid, a mask is required upon entering the hospital/clinic. If you do not have a mask, one will be given to you upon arrival. For doctor visits, patients may have 1 support person aged 92 or older with them. For treatment visits, patients cannot have anyone with them due to current Covid guidelines and our immunocompromised population.

## 2020-09-03 NOTE — Progress Notes (Signed)
Gabrielle [name]   Fax:([phone]  OFFICE PROGRESS NOTE  Gabrielle Dandy, NP [place] [address]  DIAGNOSIS: stage IV (T3, N0, M1 a) non-small cell lung cancer, adenocarcinoma presented with large left lower lobe lung mass in addition to another lingual mass and malignant left pleural effusion diagnosed in September 2021  Biomarker Findings Tumor Mutational Burden - 13 Muts/Mb Microsatellite status - MS-Stable Genomic Findings For a complete list of the genes assayed, please refer to the Appendix. KRAS G12C PRDM1 D243f*46 TERT promoter -146C>T TP53 V157F 7 Disease relevant genes with no reportable alterations: ALK, BRAF, EGFR, ERBB2, MET, RET, ROS1   PDL1 Expression: 95%  PRIOR THERAPY: None.  CURRENT THERAPY: Libtayo (Cempilimab) 350 mg IV every 3 weeks.  First dose January 15, 2020.  Status post 11 cycles.  [name] 65y.o. female returns to the clinic today for follow-up visit.  The patient was accompanied by her husband.  She is feeling fine today with no concerning complaints except for mild itching from the immunotherapy.  She denied having any chest pain, shortness of breath, cough or hemoptysis.  She denied having any fever or chills.  She has no nausea, vomiting, diarrhea or constipation.  She denied having any headache or visual changes.  She has no weight loss or night sweats.  The patient is here today for evaluation before starting cycle #12 of treatment.   MEDICAL HISTORY: Past Medical History:  Diagnosis Date   Hyperlipidemia    Hypertension    Pleural effusion    Pneumonia    Stage IV adenocarcinoma of lung, left (HCC)     ALLERGIES:  is allergic to ace inhibitors.  MEDICATIONS:  Current Outpatient Medications  Medication Sig Dispense Refill   cholecalciferol (VITAMIN D3) 25 MCG (1000 UNIT) tablet Take 2,000 Units by mouth daily.     levothyroxine (SYNTHROID) 50  MCG tablet TAKE 1 TABLET BY MOUTH DAILY BEFORE BREAKFAST 30 tablet 2   losartan-hydrochlorothiazide (HYZAAR) 100-12.5 MG tablet Take 1 tablet by mouth daily.     metFORMIN (GLUCOPHAGE) 500 MG tablet Take 500 mg by mouth 2 (two) times daily with a meal.     pravastatin (PRAVACHOL) 40 MG tablet Take 40 mg by mouth daily.     prochlorperazine (COMPAZINE) 10 MG tablet Take 1 tablet (10 mg total) by mouth every 6 (six) hours as needed for nausea or vomiting. (Patient not taking: Reported on 08/13/2020) 30 tablet 0   sertraline (ZOLOFT) 50 MG tablet Take 75 mg by mouth daily.     No current facility-administered medications for this visit.    SURGICAL HISTORY:  Past Surgical History:  Procedure Laterality Date   NEPHRECTOMY Left     REVIEW OF SYSTEMS:  A comprehensive review of systems was negative except for: Integument/breast: positive for pruritus   PHYSICAL EXAMINATION: General appearance: alert, cooperative, and no distress Head: Normocephalic, without obvious abnormality, atraumatic Neck: no adenopathy, no JVD, supple, symmetrical, trachea midline, and thyroid not enlarged, symmetric, no tenderness/mass/nodules Lymph nodes: Cervical, supraclavicular, and axillary nodes normal. Resp: clear to auscultation bilaterally Back: symmetric, no curvature. ROM normal. No CVA tenderness. Cardio: regular rate and rhythm, S1, S2 normal, no murmur, click, rub or gallop GI: soft, non-tender; bowel sounds normal; no masses,  no organomegaly Extremities: extremities normal, atraumatic, no cyanosis or edema  ECOG PERFORMANCE STATUS: 0 - Asymptomatic  Blood pressure 118/70, pulse (!) 57, temperature  97.6 F (36.4 C), temperature source Tympanic, resp. rate 18, height 5' 4" (1.626 m), weight 164 lb 8 oz (74.6 kg), SpO2 99 %.  LABORATORY DATA: Lab Results  Component Value Date   WBC 5.7 09/03/2020   HGB 11.4 (L) 09/03/2020   HCT 34.7 (L) 09/03/2020   MCV 83.0 09/03/2020   PLT 226 09/03/2020       Chemistry      Component Value Date/Time   NA 138 09/03/2020 1103   K 3.8 09/03/2020 1103   CL 106 09/03/2020 1103   CO2 21 (L) 09/03/2020 1103   BUN 27 (H) 09/03/2020 1103   CREATININE 1.00 09/03/2020 1103      Component Value Date/Time   CALCIUM 9.2 09/03/2020 1103   ALKPHOS 57 09/03/2020 1103   AST 15 09/03/2020 1103   ALT 12 09/03/2020 1103   BILITOT <0.2 (L) 09/03/2020 1103       RADIOGRAPHIC STUDIES: No results found.   ASSESSMENT AND PLAN: This is a very pleasant 64 years old white female recently diagnosed with a stage IV (T3, N0, M1 a) non-small cell lung cancer, adenocarcinoma presented with large left lower lobe lung mass in addition to another lingular mass and malignant left pleural effusion diagnosed in September 2021.  The molecular studies showed positive PD-L1 expression of 95%.  The patient also has K-ras G12C mutation which will be an option for the second line treatment. With the high PD-L1 expression I discussed with the patient treatment with single agent Libtayo (Cempilimab) 350 mg IV every 3 weeks for up to 2 years unless the patient has evidence for disease progression or unacceptable toxicity. The patient started the first cycle of this treatment on January 15, 2020. She is status post 11 cycles.  The patient continues to tolerate her treatment with Libtayo (Cempilimab) fairly well except for mild pruritus. I recommended for her to proceed with cycle #12 today as planned. The patient will come back for follow-up visit in 3 weeks for evaluation with repeat CT scan of the chest, abdomen pelvis for restaging of her disease. For the hypothyroidism, she will continue her current treatment with levothyroxine. The patient was advised to call immediately if she has any concerning symptoms in the interval. The patient voices understanding of current disease status and treatment options and is in agreement with the current care plan.  All questions were answered. The  patient knows to call the clinic with any problems, questions or concerns. We can certainly see the patient much sooner if necessary.   Disclaimer: This note was dictated with voice recognition software. Similar sounding words can inadvertently be transcribed and may not be corrected upon review.       

## 2020-09-05 ENCOUNTER — Telehealth: Payer: Self-pay | Admitting: Internal Medicine

## 2020-09-05 NOTE — Telephone Encounter (Signed)
Scheduled per los. Called and left msg. Mailed printout  °

## 2020-09-19 ENCOUNTER — Ambulatory Visit (HOSPITAL_COMMUNITY)
Admission: RE | Admit: 2020-09-19 | Discharge: 2020-09-19 | Disposition: A | Discharge status: Home / Self Care | Payer: Medicare Other | Source: Ambulatory Visit | Attending: Internal Medicine | Admitting: Internal Medicine

## 2020-09-19 ENCOUNTER — Other Ambulatory Visit: Payer: Self-pay

## 2020-09-19 DIAGNOSIS — C3490 Malignant neoplasm of unspecified part of unspecified bronchus or lung: Secondary | ICD-10-CM | POA: Diagnosis present

## 2020-09-19 MED ORDER — IOHEXOL 350 MG/ML SOLN
100.0000 mL | Freq: Once | INTRAVENOUS | Status: AC | PRN
  Administered 2020-09-19: 80 mL via INTRAVENOUS

## 2020-09-19 MED ORDER — SODIUM CHLORIDE (PF) 0.9 % IJ SOLN
INTRAMUSCULAR | Status: AC
  Filled 2020-09-19: qty 50

## 2020-09-24 ENCOUNTER — Inpatient Hospital Stay: Payer: Medicare Other

## 2020-09-24 ENCOUNTER — Other Ambulatory Visit: Payer: Self-pay | Admitting: Internal Medicine

## 2020-09-24 ENCOUNTER — Other Ambulatory Visit: Payer: Self-pay

## 2020-09-24 ENCOUNTER — Inpatient Hospital Stay: Payer: Medicare Other | Attending: Internal Medicine | Admitting: Internal Medicine

## 2020-09-24 VITALS — BP 130/66 | HR 71 | Temp 97.7°F | Resp 20 | Ht 64.0 in | Wt 163.4 lb

## 2020-09-24 DIAGNOSIS — E785 Hyperlipidemia, unspecified: Secondary | ICD-10-CM | POA: Insufficient documentation

## 2020-09-24 DIAGNOSIS — Z7984 Long term (current) use of oral hypoglycemic drugs: Secondary | ICD-10-CM | POA: Insufficient documentation

## 2020-09-24 DIAGNOSIS — J910 Malignant pleural effusion: Secondary | ICD-10-CM | POA: Diagnosis not present

## 2020-09-24 DIAGNOSIS — I10 Essential (primary) hypertension: Secondary | ICD-10-CM | POA: Diagnosis not present

## 2020-09-24 DIAGNOSIS — Z79899 Other long term (current) drug therapy: Secondary | ICD-10-CM | POA: Diagnosis not present

## 2020-09-24 DIAGNOSIS — C3492 Malignant neoplasm of unspecified part of left bronchus or lung: Secondary | ICD-10-CM | POA: Diagnosis not present

## 2020-09-24 DIAGNOSIS — R5382 Chronic fatigue, unspecified: Secondary | ICD-10-CM

## 2020-09-24 DIAGNOSIS — C3432 Malignant neoplasm of lower lobe, left bronchus or lung: Secondary | ICD-10-CM | POA: Insufficient documentation

## 2020-09-24 DIAGNOSIS — Z5112 Encounter for antineoplastic immunotherapy: Secondary | ICD-10-CM | POA: Diagnosis present

## 2020-09-24 DIAGNOSIS — Z905 Acquired absence of kidney: Secondary | ICD-10-CM | POA: Insufficient documentation

## 2020-09-24 LAB — CBC WITH DIFFERENTIAL (CANCER CENTER ONLY)
Abs Immature Granulocytes: 0.03 10*3/uL (ref 0.00–0.07)
Basophils Absolute: 0.1 10*3/uL (ref 0.0–0.1)
Basophils Relative: 1 %
Eosinophils Absolute: 0.3 10*3/uL (ref 0.0–0.5)
Eosinophils Relative: 5 %
HCT: 34 % — ABNORMAL LOW (ref 36.0–46.0)
Hemoglobin: 10.9 g/dL — ABNORMAL LOW (ref 12.0–15.0)
Immature Granulocytes: 1 %
Lymphocytes Relative: 12 %
Lymphs Abs: 0.7 10*3/uL (ref 0.7–4.0)
MCH: 27.4 pg (ref 26.0–34.0)
MCHC: 32.1 g/dL (ref 30.0–36.0)
MCV: 85.4 fL (ref 80.0–100.0)
Monocytes Absolute: 0.3 10*3/uL (ref 0.1–1.0)
Monocytes Relative: 5 %
Neutro Abs: 4.3 10*3/uL (ref 1.7–7.7)
Neutrophils Relative %: 76 %
Platelet Count: 267 10*3/uL (ref 150–400)
RBC: 3.98 MIL/uL (ref 3.87–5.11)
RDW: 14.7 % (ref 11.5–15.5)
WBC Count: 5.6 10*3/uL (ref 4.0–10.5)
nRBC: 0 % (ref 0.0–0.2)

## 2020-09-24 LAB — CMP (CANCER CENTER ONLY)
ALT: 15 U/L (ref 0–44)
AST: 13 U/L — ABNORMAL LOW (ref 15–41)
Albumin: 3.3 g/dL — ABNORMAL LOW (ref 3.5–5.0)
Alkaline Phosphatase: 63 U/L (ref 38–126)
Anion gap: 9 (ref 5–15)
BUN: 15 mg/dL (ref 8–23)
CO2: 23 mmol/L (ref 22–32)
Calcium: 9.2 mg/dL (ref 8.9–10.3)
Chloride: 107 mmol/L (ref 98–111)
Creatinine: 0.92 mg/dL (ref 0.44–1.00)
GFR, Estimated: >60 mL/min (ref >60)
Glucose, Bld: 219 mg/dL — ABNORMAL HIGH (ref 70–99)
Potassium: 3.8 mmol/L (ref 3.5–5.1)
Sodium: 139 mmol/L (ref 135–145)
Total Bilirubin: 0.2 mg/dL — ABNORMAL LOW (ref 0.3–1.2)
Total Protein: 7.1 g/dL (ref 6.5–8.1)

## 2020-09-24 LAB — TSH: TSH: 3.79 u[IU]/mL (ref 0.308–3.960)

## 2020-09-24 MED ORDER — SODIUM CHLORIDE 0.9 % IV SOLN
350.0000 mg | Freq: Once | INTRAVENOUS | Status: AC
  Administered 2020-09-24: 350 mg via INTRAVENOUS
  Filled 2020-09-24: qty 7

## 2020-09-24 MED ORDER — LEVOTHYROXINE SODIUM 50 MCG PO TABS: 50.0000 ug | ORAL_TABLET | Freq: Every day | ORAL | 2 refills | Status: DC

## 2020-09-24 MED ORDER — SODIUM CHLORIDE 0.9 % IV SOLN
Freq: Once | INTRAVENOUS | Status: AC
  Filled 2020-09-24: qty 250

## 2020-09-24 NOTE — Progress Notes (Signed)
[facility] [phone]   Fax:(336) [phone]  OFFICE PROGRESS NOTE  Gabrielle Dandy, NP [place] [address]  DIAGNOSIS: stage IV (T3, N0, M1 a) non-small cell lung cancer, adenocarcinoma presented with large left lower lobe lung mass in addition to another lingual mass and malignant left pleural effusion diagnosed in September 2021  Biomarker Findings Tumor Mutational Burden - 13 Muts/Mb Microsatellite status - MS-Stable Genomic Findings For a complete list of the genes assayed, please refer to the Appendix. KRAS G12C PRDM1 D274f*46 TERT promoter -146C>T TP53 V157F 7 Disease relevant genes with no reportable alterations: ALK, BRAF, EGFR, ERBB2, MET, RET, ROS1   PDL1 Expression: 95%  PRIOR THERAPY: None.  CURRENT THERAPY: Libtayo (Cempilimab) 350 mg IV every 3 weeks.  First dose January 15, 2020.  Status post 12 cycles.  [name] 65y.o. [name] returns to the clinic today for follow-up visit accompanied by her husband.  The patient is feeling fine today with no concerning complaints except for few episodes of diarrhea that got worse after the oral contrast.  She take Pepto-Bismol with improvement.  She denied having any current nausea, vomiting, abdominal pain or constipation.  She denied having any chest pain, shortness of breath, cough or hemoptysis.  She has no significant weight loss or night sweats.  She has no fever or chills.  She continues to tolerate her treatment with Libtayo (Cempilimab) fairly well.  The patient is here today for evaluation with repeat CT scan of the chest, abdomen pelvis for restaging of her disease.   MEDICAL HISTORY: Past Medical History:  Diagnosis Date   Hyperlipidemia    Hypertension    Pleural effusion    Pneumonia    Stage IV adenocarcinoma of lung, left (HCC)     ALLERGIES:  is allergic to ace inhibitors.  MEDICATIONS:  Current Outpatient Medications   Medication Sig Dispense Refill   cholecalciferol (VITAMIN D3) 25 MCG (1000 UNIT) tablet Take 2,000 Units by mouth daily.     levothyroxine (SYNTHROID) 50 MCG tablet TAKE 1 TABLET BY MOUTH DAILY BEFORE BREAKFAST 30 tablet 2   losartan-hydrochlorothiazide (HYZAAR) 100-12.5 MG tablet Take 1 tablet by mouth daily.     metFORMIN (GLUCOPHAGE) 500 MG tablet Take 500 mg by mouth 2 (two) times daily with a meal.     pravastatin (PRAVACHOL) 40 MG tablet Take 40 mg by mouth daily.     prochlorperazine (COMPAZINE) 10 MG tablet Take 1 tablet (10 mg total) by mouth every 6 (six) hours as needed for nausea or vomiting. 30 tablet 0   sertraline (ZOLOFT) 50 MG tablet Take 75 mg by mouth daily.     No current facility-administered medications for this visit.    SURGICAL HISTORY:  Past Surgical History:  Procedure Laterality Date   NEPHRECTOMY Left     REVIEW OF SYSTEMS:  Constitutional: positive for fatigue Eyes: negative Ears, nose, mouth, throat, and face: negative Respiratory: positive for dyspnea on exertion Cardiovascular: negative Gastrointestinal: positive for diarrhea Genitourinary:negative Integument/breast: negative Hematologic/lymphatic: negative Musculoskeletal:negative Neurological: negative Behavioral/Psych: negative Endocrine: negative Allergic/Immunologic: negative   PHYSICAL EXAMINATION: General appearance: alert, cooperative, and no distress Head: Normocephalic, without obvious abnormality, atraumatic Neck: no adenopathy, no JVD, supple, symmetrical, trachea midline, and thyroid not enlarged, symmetric, no tenderness/mass/nodules Lymph nodes: Cervical, supraclavicular, and axillary nodes normal. Resp: clear to auscultation bilaterally Back: symmetric, no curvature. ROM normal. No CVA tenderness. Cardio: regular rate and rhythm,  S1, S2 normal, no murmur, click, rub or gallop GI: soft, non-tender; bowel sounds normal; no masses,  no organomegaly Extremities: extremities  normal, atraumatic, no cyanosis or edema Neurologic: Alert and oriented X 3, normal strength and tone. Normal symmetric reflexes. Normal coordination and gait  ECOG PERFORMANCE STATUS: 1 - Symptomatic but completely ambulatory  Blood pressure 130/66, pulse 71, temperature 97.7 F (36.5 C), temperature source Tympanic, resp. rate 20, height '5\' 4"'  (1.626 m), weight 163 lb 6.4 oz (74.1 kg), SpO2 98 %.  LABORATORY DATA: Lab Results  Component Value Date   WBC 5.6 09/24/2020   HGB 10.9 (L) 09/24/2020   HCT 34.0 (L) 09/24/2020   MCV 85.4 09/24/2020   PLT 267 09/24/2020      Chemistry      Component Value Date/Time   NA 139 09/24/2020 0935   K 3.8 09/24/2020 0935   CL 107 09/24/2020 0935   CO2 23 09/24/2020 0935   BUN 15 09/24/2020 0935   CREATININE 0.92 09/24/2020 0935      Component Value Date/Time   CALCIUM 9.2 09/24/2020 0935   ALKPHOS 63 09/24/2020 0935   AST 13 (L) 09/24/2020 0935   ALT 15 09/24/2020 0935   BILITOT 0.2 (L) 09/24/2020 0935       RADIOGRAPHIC STUDIES: CT Chest W Contrast  Result Date: 09/21/2020 CLINICAL DATA:  Restaging non-small cell lung cancer. EXAM: CT CHEST, ABDOMEN, AND PELVIS WITH CONTRAST TECHNIQUE: Multidetector CT imaging of the chest, abdomen and pelvis was performed following the standard protocol during bolus administration of intravenous contrast. CONTRAST:  75m OMNIPAQUE IOHEXOL 350 MG/ML SOLN COMPARISON:  07/18/2020 FINDINGS: CT CHEST FINDINGS Cardiovascular: Heart size normal. Aortic atherosclerosis with coronary artery calcifications. No pericardial effusion. Mediastinum/Nodes: There is left lung volume loss with shift of the mediastinum into the left hemithorax as noted previously. Normal appearance of the thyroid gland. The trachea appears patent and is midline. Esophagus appears normal. Moderate size hiatal hernia is identified. Several prominent thoracic lymph nodes are identified, none of which meet CT criteria for adenopathy, including:  -none 9 mm right axillary node, image 18/2.  Formally 7 mm. -7 mm left supraclavicular lymph node the, image 8/2. Formally 6 mm anterior mediastinal -within the anterior mediastinum there is a lymph node or soft tissue nodule measuring 0.9 cm, image 31/2. Formally 1 cm. -pericardial lymph node measures 5 mm, image 29/2.  Previously 4 mm. Lungs/Pleura: Chronic left lung volume loss. Loculated left pleural effusion appears unchanged in volume overlying the left lower lobe and lingula. Unchanged appearance of multifocal areas of focal pleural thickening overlying the left lung, unchanged from previous exam, images 23/2, image 30/2, and image 39/2. Small nonspecific right upper lobe lung nodule appears unchanged measuring 3 mm, image 41/4. Medial right upper lobe lung nodule is unchanged measuring 3 mm, image 41/4. No new lung nodules. Musculoskeletal: No chest wall mass or suspicious bone lesions identified. CT ABDOMEN PELVIS FINDINGS Hepatobiliary: Low-density structure within inferior right hepatic lobe measures 1 cm in appears unchanged from prior exam. Gallbladder appears unremarkable.  No bile duct dilatation. Pancreas: Unremarkable. No pancreatic ductal dilatation or surrounding inflammatory changes. Spleen: Normal in size without focal abnormality. Adrenals/Urinary Tract: Normal adrenal glands. Status post left nephrectomy. Normal right kidney. Urinary bladder is unremarkable. Stomach/Bowel: Hiatal hernia. No bowel wall thickening, inflammation or distension. Distal colonic diverticula identified. Vascular/Lymphatic: Aortic atherosclerosis. No abdominopelvic adenopathy. Reproductive: Uterus and bilateral adnexa are unremarkable. Other: No free fluid or fluid collections. Musculoskeletal: No acute or significant  osseous findings. IMPRESSION: 1. Stable CT of the chest, abdomen and pelvis. 2. Unchanged appearance of the left hemithorax with volume loss, small loculated pleural effusion and several areas of focal  pleural thickening. 3. Tiny nonspecific pulmonary nodules within the right upper lobe are unchanged from previous exam. 4. Unchanged retrosternal soft tissue nodule or lymph node. 5. Aortic atherosclerosis and coronary artery calcifications. 6. Hiatal hernia. 7. Status post left nephrectomy. Aortic Atherosclerosis (ICD10-I70.0). Electronically Signed   By: Kerby Moors M.D.   On: 09/21/2020 13:12   CT Abdomen Pelvis W Contrast  Result Date: 09/21/2020 CLINICAL DATA:  Restaging non-small cell lung cancer. EXAM: CT CHEST, ABDOMEN, AND PELVIS WITH CONTRAST TECHNIQUE: Multidetector CT imaging of the chest, abdomen and pelvis was performed following the standard protocol during bolus administration of intravenous contrast. CONTRAST:  21m OMNIPAQUE IOHEXOL 350 MG/ML SOLN COMPARISON:  07/18/2020 FINDINGS: CT CHEST FINDINGS Cardiovascular: Heart size normal. Aortic atherosclerosis with coronary artery calcifications. No pericardial effusion. Mediastinum/Nodes: There is left lung volume loss with shift of the mediastinum into the left hemithorax as noted previously. Normal appearance of the thyroid gland. The trachea appears patent and is midline. Esophagus appears normal. Moderate size hiatal hernia is identified. Several prominent thoracic lymph nodes are identified, none of which meet CT criteria for adenopathy, including: -none 9 mm right axillary node, image 18/2.  Formally 7 mm. -7 mm left supraclavicular lymph node the, image 8/2. Formally 6 mm anterior mediastinal -within the anterior mediastinum there is a lymph node or soft tissue nodule measuring 0.9 cm, image 31/2. Formally 1 cm. -pericardial lymph node measures 5 mm, image 29/2.  Previously 4 mm. Lungs/Pleura: Chronic left lung volume loss. Loculated left pleural effusion appears unchanged in volume overlying the left lower lobe and lingula. Unchanged appearance of multifocal areas of focal pleural thickening overlying the left lung, unchanged from  previous exam, images 23/2, image 30/2, and image 39/2. Small nonspecific right upper lobe lung nodule appears unchanged measuring 3 mm, image 41/4. Medial right upper lobe lung nodule is unchanged measuring 3 mm, image 41/4. No new lung nodules. Musculoskeletal: No chest wall mass or suspicious bone lesions identified. CT ABDOMEN PELVIS FINDINGS Hepatobiliary: Low-density structure within inferior right hepatic lobe measures 1 cm in appears unchanged from prior exam. Gallbladder appears unremarkable.  No bile duct dilatation. Pancreas: Unremarkable. No pancreatic ductal dilatation or surrounding inflammatory changes. Spleen: Normal in size without focal abnormality. Adrenals/Urinary Tract: Normal adrenal glands. Status post left nephrectomy. Normal right kidney. Urinary bladder is unremarkable. Stomach/Bowel: Hiatal hernia. No bowel wall thickening, inflammation or distension. Distal colonic diverticula identified. Vascular/Lymphatic: Aortic atherosclerosis. No abdominopelvic adenopathy. Reproductive: Uterus and bilateral adnexa are unremarkable. Other: No free fluid or fluid collections. Musculoskeletal: No acute or significant osseous findings. IMPRESSION: 1. Stable CT of the chest, abdomen and pelvis. 2. Unchanged appearance of the left hemithorax with volume loss, small loculated pleural effusion and several areas of focal pleural thickening. 3. Tiny nonspecific pulmonary nodules within the right upper lobe are unchanged from previous exam. 4. Unchanged retrosternal soft tissue nodule or lymph node. 5. Aortic atherosclerosis and coronary artery calcifications. 6. Hiatal hernia. 7. Status post left nephrectomy. Aortic Atherosclerosis (ICD10-I70.0). Electronically Signed   By: TKerby MoorsM.D.   On: 09/21/2020 13:12     ASSESSMENT AND PLAN: This is a very pleasant 65years old [name] recently diagnosed with a stage IV (T3, N0, M1 a) non-small cell lung cancer, adenocarcinoma presented with large left  lower lobe lung  mass in addition to another lingular mass and malignant left pleural effusion diagnosed in September 2021.  The molecular studies showed positive PD-L1 expression of 95%.  The patient also has K-ras G12C mutation which will be an option for the second line treatment. With the high PD-L1 expression I discussed with the patient treatment with single agent Libtayo (Cempilimab) 350 mg IV every 3 weeks for up to 2 years unless the patient has evidence for disease progression or unacceptable toxicity. The patient started the first cycle of this treatment on January 15, 2020. She is status post 12 cycles.  The patient continues to tolerate this treatment well except for few episodes of diarrhea and improved with Pepto-Bismol.  She had repeat CT scan of the chest, abdomen pelvis performed recently.  I personally and independently reviewed the scans and discussed the results with the patient and her husband. Her scan showed no concerning findings for disease progression. I recommended for her to continue her current treatment with Libtayo (Cempilimab) with the same dose. I will see her back for follow-up visit in 3 weeks for evaluation before starting cycle #14. The patient was advised to call immediately if she has any other concerning symptoms in the interval. The patient voices understanding of current disease status and treatment options and is in agreement with the current care plan.  All questions were answered. The patient knows to call the clinic with any problems, questions or concerns. We can certainly see the patient much sooner if necessary.   Disclaimer: This note was dictated with voice recognition software. Similar sounding words can inadvertently be transcribed and may not be corrected upon review.

## 2020-10-11 NOTE — Progress Notes (Signed)
[facility] OFFICE PROGRESS NOTE  [name], NP [address] [place] [id number]  DIAGNOSIS: Stage IV (T3, N0, M1 a) non-small cell lung cancer, adenocarcinoma presented with large left lower lobe lung mass in addition to another lingual mass and malignant left pleural effusion diagnosed in September 2021   Biomarker Findings Tumor Mutational Burden - 13 Muts/Mb Microsatellite status - MS-Stable Genomic Findings For a complete list of the genes assayed, please refer to the Appendix. KRAS G12C PRDM1 D222fs*46 TERT promoter -146C>T TP53 V157F 7 Disease relevant genes with no reportable alterations: ALK, BRAF, EGFR, ERBB2, MET, RET, ROS1   PDL1 Expression: 95%  PRIOR THERAPY: None   CURRENT THERAPY: Libtayo (Cempilimab) 350 mg IV every 3 weeks.  First dose January 15, 2020.  Status post 13 cycles  INTERVAL HISTORY: Kaytelyn H Korpela 65 y.o. female returns to the clinic for a follow up visit accompanied by her husband. The patient is feeling well today without any concerning complaints except she has had some diarrhea which started a few days prior to her last CT scan on 09/18/20. Her CT scan did not show any inflammatory changes in her bowels. Her diarrhea has let up some since that time. On Sunday, she went out to get imodium which she took once. She has only had 1 bowel movement a day since Sunday and states it is becoming a little more formed than prior. Denies blood in the stool.She has some associated weight loss with her diarrhea due to decreased appetite. The patient continues to tolerate treatment with immunotherapy with Libtayo well without any adverse side effects except she does have some scattered skin lesions which are consistent with immunotherapy mediated although these are getting better recently. She is using Cerve. These do not bother her. She also takes benadryl if needed. Denies any fever, chills, or night sweats. Denies any chest pain, cough,  or hemoptysis. She denies any significant shortness of breath. She has had a stable pleural effusion which she is not very symptomatic from. Denies any nausea, vomiting, or constipation. Denies any headache or visual changes. The patient is here today for evaluation prior to starting cycle # 14.    MEDICAL HISTORY: Past Medical History:  Diagnosis Date   Hyperlipidemia    Hypertension    Pleural effusion    Pneumonia    Stage IV adenocarcinoma of lung, left (HCC)     ALLERGIES:  is allergic to ace inhibitors.  MEDICATIONS:  Current Outpatient Medications  Medication Sig Dispense Refill   cholecalciferol (VITAMIN D3) 25 MCG (1000 UNIT) tablet Take 2,000 Units by mouth daily.     levothyroxine (SYNTHROID) 50 MCG tablet Take 1 tablet (50 mcg total) by mouth daily before breakfast. 30 tablet 2   losartan-hydrochlorothiazide (HYZAAR) 100-12.5 MG tablet Take 1 tablet by mouth daily.     metFORMIN (GLUCOPHAGE) 500 MG tablet Take 500 mg by mouth 2 (two) times daily with a meal.     pravastatin (PRAVACHOL) 40 MG tablet Take 40 mg by mouth daily.     prochlorperazine (COMPAZINE) 10 MG tablet Take 1 tablet (10 mg total) by mouth every 6 (six) hours as needed for nausea or vomiting. 30 tablet 0   sertraline (ZOLOFT) 50 MG tablet Take 75 mg by mouth daily.     No current facility-administered medications for this visit.    SURGICAL HISTORY:  Past Surgical History:  Procedure Laterality Date   NEPHRECTOMY Left     REVIEW  OF SYSTEMS:   Review of Systems  Constitutional: Negative for appetite change, chills, fatigue, fever and unexpected weight change.  HENT:   Negative for mouth sores, nosebleeds, sore throat and trouble swallowing.   Eyes: Negative for eye problems and icterus.  Respiratory: Negative for cough, hemoptysis, shortness of breath and wheezing.   Cardiovascular: Negative for chest pain and leg swelling.  Gastrointestinal: Negative for abdominal pain, constipation, diarrhea,  nausea and vomiting.  Genitourinary: Negative for bladder incontinence, difficulty urinating, dysuria, frequency and hematuria.   Musculoskeletal: Negative for back pain, gait problem, neck pain and neck stiffness.  Skin: Negative for itching and rash.  Neurological: Negative for dizziness, extremity weakness, gait problem, headaches, light-headedness and seizures.  Hematological: Negative for adenopathy. Does not bruise/bleed easily.  Psychiatric/Behavioral: Negative for confusion, depression and sleep disturbance. The patient is not nervous/anxious.     PHYSICAL EXAMINATION:  Blood pressure 133/69, pulse 60, temperature 97.9 F (36.6 C), temperature source Tympanic, resp. rate 19, height $RemoveBe'5\' 4"'[id number]$  (1.626 m), weight 156 lb 11.2 oz (71.1 kg), SpO2 100 %.  ECOG PERFORMANCE STATUS: 1  Physical Exam  Constitutional: Oriented to person, place, and time and well-developed, well-nourished, and in no distress.   HENT: Head: Normocephalic and atraumatic. Mouth/Throat: Oropharynx is clear and moist. No oropharyngeal exudate. Eyes: Conjunctivae are normal. Right eye exhibits no discharge. Left eye exhibits no discharge. No scleral icterus. Neck: Normal range of motion. Neck supple. Cardiovascular: Normal rate, regular rhythm, normal heart sounds and intact distal pulses.   Pulmonary/Chest: Effort normal and breath sounds normal. No respiratory distress. No wheezes. No rales. Abdominal: Soft. Bowel sounds are normal. Exhibits no distension and no mass. There is no tenderness.  Musculoskeletal: Normal range of motion. Exhibits no edema.  Lymphadenopathy:    No cervical adenopathy.  Neurological: Alert and oriented to person, place, and time. Exhibits normal muscle tone. Gait normal. Coordination normal. Skin: Some dry scaly small scattered skin lesions on chest and extremities. Skin is warm and dry. Not diaphoretic. No erythema. No pallor.  Psychiatric: Mood, memory and judgment normal. Vitals  reviewed.  LABORATORY DATA: Lab Results  Component Value Date   WBC 5.2 10/15/2020   HGB 11.2 (L) 10/15/2020   HCT 34.3 (L) 10/15/2020   MCV 86.0 10/15/2020   PLT 204 10/15/2020      Chemistry      Component Value Date/Time   NA 139 10/15/2020 1031   K 3.6 10/15/2020 1031   CL 108 10/15/2020 1031   CO2 22 10/15/2020 1031   BUN 15 10/15/2020 1031   CREATININE 1.05 (H) 10/15/2020 1031      Component Value Date/Time   CALCIUM 9.6 10/15/2020 1031   ALKPHOS 59 10/15/2020 1031   AST 15 10/15/2020 1031   ALT 11 10/15/2020 1031   BILITOT 0.3 10/15/2020 1031       RADIOGRAPHIC STUDIES:  CT Chest W Contrast  Result Date: 09/21/2020 CLINICAL DATA:  Restaging non-small cell lung cancer. EXAM: CT CHEST, ABDOMEN, AND PELVIS WITH CONTRAST TECHNIQUE: Multidetector CT imaging of the chest, abdomen and pelvis was performed following the standard protocol during bolus administration of intravenous contrast. CONTRAST:  26mL OMNIPAQUE IOHEXOL 350 MG/ML SOLN COMPARISON:  07/18/2020 FINDINGS: CT CHEST FINDINGS Cardiovascular: Heart size normal. Aortic atherosclerosis with coronary artery calcifications. No pericardial effusion. Mediastinum/Nodes: There is left lung volume loss with shift of the mediastinum into the left hemithorax as noted previously. Normal appearance of the thyroid gland. The trachea appears patent and is midline. Esophagus appears  normal. Moderate size hiatal hernia is identified. Several prominent thoracic lymph nodes are identified, none of which meet CT criteria for adenopathy, including: -none 9 mm right axillary node, image 18/2.  Formally 7 mm. -7 mm left supraclavicular lymph node the, image 8/2. Formally 6 mm anterior mediastinal -within the anterior mediastinum there is a lymph node or soft tissue nodule measuring 0.9 cm, image 31/2. Formally 1 cm. -pericardial lymph node measures 5 mm, image 29/2.  Previously 4 mm. Lungs/Pleura: Chronic left lung volume loss. Loculated left  pleural effusion appears unchanged in volume overlying the left lower lobe and lingula. Unchanged appearance of multifocal areas of focal pleural thickening overlying the left lung, unchanged from previous exam, images 23/2, image 30/2, and image 39/2. Small nonspecific right upper lobe lung nodule appears unchanged measuring 3 mm, image 41/4. Medial right upper lobe lung nodule is unchanged measuring 3 mm, image 41/4. No new lung nodules. Musculoskeletal: No chest wall mass or suspicious bone lesions identified. CT ABDOMEN PELVIS FINDINGS Hepatobiliary: Low-density structure within inferior right hepatic lobe measures 1 cm in appears unchanged from prior exam. Gallbladder appears unremarkable.  No bile duct dilatation. Pancreas: Unremarkable. No pancreatic ductal dilatation or surrounding inflammatory changes. Spleen: Normal in size without focal abnormality. Adrenals/Urinary Tract: Normal adrenal glands. Status post left nephrectomy. Normal right kidney. Urinary bladder is unremarkable. Stomach/Bowel: Hiatal hernia. No bowel wall thickening, inflammation or distension. Distal colonic diverticula identified. Vascular/Lymphatic: Aortic atherosclerosis. No abdominopelvic adenopathy. Reproductive: Uterus and bilateral adnexa are unremarkable. Other: No free fluid or fluid collections. Musculoskeletal: No acute or significant osseous findings. IMPRESSION: 1. Stable CT of the chest, abdomen and pelvis. 2. Unchanged appearance of the left hemithorax with volume loss, small loculated pleural effusion and several areas of focal pleural thickening. 3. Tiny nonspecific pulmonary nodules within the right upper lobe are unchanged from previous exam. 4. Unchanged retrosternal soft tissue nodule or lymph node. 5. Aortic atherosclerosis and coronary artery calcifications. 6. Hiatal hernia. 7. Status post left nephrectomy. Aortic Atherosclerosis (ICD10-I70.0). Electronically Signed   By: Kerby Moors M.D.   On: 09/21/2020 13:12    CT Abdomen Pelvis W Contrast  Result Date: 09/21/2020 CLINICAL DATA:  Restaging non-small cell lung cancer. EXAM: CT CHEST, ABDOMEN, AND PELVIS WITH CONTRAST TECHNIQUE: Multidetector CT imaging of the chest, abdomen and pelvis was performed following the standard protocol during bolus administration of intravenous contrast. CONTRAST:  24mL OMNIPAQUE IOHEXOL 350 MG/ML SOLN COMPARISON:  07/18/2020 FINDINGS: CT CHEST FINDINGS Cardiovascular: Heart size normal. Aortic atherosclerosis with coronary artery calcifications. No pericardial effusion. Mediastinum/Nodes: There is left lung volume loss with shift of the mediastinum into the left hemithorax as noted previously. Normal appearance of the thyroid gland. The trachea appears patent and is midline. Esophagus appears normal. Moderate size hiatal hernia is identified. Several prominent thoracic lymph nodes are identified, none of which meet CT criteria for adenopathy, including: -none 9 mm right axillary node, image 18/2.  Formally 7 mm. -7 mm left supraclavicular lymph node the, image 8/2. Formally 6 mm anterior mediastinal -within the anterior mediastinum there is a lymph node or soft tissue nodule measuring 0.9 cm, image 31/2. Formally 1 cm. -pericardial lymph node measures 5 mm, image 29/2.  Previously 4 mm. Lungs/Pleura: Chronic left lung volume loss. Loculated left pleural effusion appears unchanged in volume overlying the left lower lobe and lingula. Unchanged appearance of multifocal areas of focal pleural thickening overlying the left lung, unchanged from previous exam, images 23/2, image 30/2, and image 39/2. Small nonspecific  right upper lobe lung nodule appears unchanged measuring 3 mm, image 41/4. Medial right upper lobe lung nodule is unchanged measuring 3 mm, image 41/4. No new lung nodules. Musculoskeletal: No chest wall mass or suspicious bone lesions identified. CT ABDOMEN PELVIS FINDINGS Hepatobiliary: Low-density structure within inferior right  hepatic lobe measures 1 cm in appears unchanged from prior exam. Gallbladder appears unremarkable.  No bile duct dilatation. Pancreas: Unremarkable. No pancreatic ductal dilatation or surrounding inflammatory changes. Spleen: Normal in size without focal abnormality. Adrenals/Urinary Tract: Normal adrenal glands. Status post left nephrectomy. Normal right kidney. Urinary bladder is unremarkable. Stomach/Bowel: Hiatal hernia. No bowel wall thickening, inflammation or distension. Distal colonic diverticula identified. Vascular/Lymphatic: Aortic atherosclerosis. No abdominopelvic adenopathy. Reproductive: Uterus and bilateral adnexa are unremarkable. Other: No free fluid or fluid collections. Musculoskeletal: No acute or significant osseous findings. IMPRESSION: 1. Stable CT of the chest, abdomen and pelvis. 2. Unchanged appearance of the left hemithorax with volume loss, small loculated pleural effusion and several areas of focal pleural thickening. 3. Tiny nonspecific pulmonary nodules within the right upper lobe are unchanged from previous exam. 4. Unchanged retrosternal soft tissue nodule or lymph node. 5. Aortic atherosclerosis and coronary artery calcifications. 6. Hiatal hernia. 7. Status post left nephrectomy. Aortic Atherosclerosis (ICD10-I70.0). Electronically Signed   By: Kerby Moors M.D.   On: 09/21/2020 13:12     ASSESSMENT/PLAN:  This is a very pleasant 65 year old Caucasian female diagnosed with stage IV (T3, N0, M1A) non-small cell lung cancer, adenocarcinoma.  She presented with a large left lower lobe lung mass in addition to another lingular mass and a malignant left pleural effusion.  She was diagnosed in September 2021.  Her molecular studies show that she has a PD-L1 expression at 95%.  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  The patient is currently undergoing treatment with single agent Libtayo (Cempilimab) 350 mg IV every 3 weeks  for up to 2 years unless the patient has evidence for disease progression or unacceptable toxicity.  She is status post 13 cycles and tolerated it well without any concerning adverse side effects.   I reviewed her symptoms with Dr. Julien Nordmann. Her diarrhea improves with imodium and she is presently having 1 bowel movement a day. No tenderness on exam today. Her last CT scan did not show inflammatory changes in the bowels or any acute findings. We will monitor for now and she was advised to use imodium if needed. Of course, if she develops worsening diarrhea that is not responsive to imodium, then she was advised to call us sooner. Labs were reviewed. Recommend that she proceed with cycle #14 today as scheduled.   We will see her back for a follow up visit in 3 weeks for evaluation before starting cycle #15.    She will continue to use hydrocortisone cream if needed for her rash and benadryl if needed.  The patient was advised to call immediately if she has any concerning symptoms in the interval. The patient voices understanding of current disease status and treatment options and is in agreement with the current care plan. All questions were answered. The patient knows to call the clinic with any problems, questions or concerns. We can certainly see the patient much sooner if necessary   No orders of the defined types were placed in this encounter.     The total time spent in the appointment was 20-29 minutes.  Zachari Alberta L Shamina Etheridge, PA-C  10/15/20  

## 2020-10-15 ENCOUNTER — Inpatient Hospital Stay: Payer: Medicare Other | Attending: Physician Assistant | Admitting: Physician Assistant

## 2020-10-15 ENCOUNTER — Inpatient Hospital Stay: Payer: Medicare Other

## 2020-10-15 ENCOUNTER — Other Ambulatory Visit: Payer: Self-pay

## 2020-10-15 VITALS — BP 133/69 | HR 60 | Temp 97.9°F | Resp 19 | Ht 64.0 in | Wt 156.7 lb

## 2020-10-15 DIAGNOSIS — E785 Hyperlipidemia, unspecified: Secondary | ICD-10-CM | POA: Diagnosis not present

## 2020-10-15 DIAGNOSIS — Z79899 Other long term (current) drug therapy: Secondary | ICD-10-CM | POA: Insufficient documentation

## 2020-10-15 DIAGNOSIS — R197 Diarrhea, unspecified: Secondary | ICD-10-CM | POA: Insufficient documentation

## 2020-10-15 DIAGNOSIS — C3492 Malignant neoplasm of unspecified part of left bronchus or lung: Secondary | ICD-10-CM

## 2020-10-15 DIAGNOSIS — Z5112 Encounter for antineoplastic immunotherapy: Secondary | ICD-10-CM

## 2020-10-15 DIAGNOSIS — C3432 Malignant neoplasm of lower lobe, left bronchus or lung: Secondary | ICD-10-CM | POA: Diagnosis present

## 2020-10-15 DIAGNOSIS — J910 Malignant pleural effusion: Secondary | ICD-10-CM | POA: Diagnosis not present

## 2020-10-15 DIAGNOSIS — Z905 Acquired absence of kidney: Secondary | ICD-10-CM | POA: Insufficient documentation

## 2020-10-15 DIAGNOSIS — Z7984 Long term (current) use of oral hypoglycemic drugs: Secondary | ICD-10-CM | POA: Insufficient documentation

## 2020-10-15 DIAGNOSIS — R5382 Chronic fatigue, unspecified: Secondary | ICD-10-CM

## 2020-10-15 DIAGNOSIS — I10 Essential (primary) hypertension: Secondary | ICD-10-CM | POA: Insufficient documentation

## 2020-10-15 HISTORY — DX: Diarrhea, unspecified: R19.7

## 2020-10-15 LAB — CBC WITH DIFFERENTIAL (CANCER CENTER ONLY)
Abs Immature Granulocytes: 0.01 10*3/uL (ref 0.00–0.07)
Basophils Absolute: 0.1 10*3/uL (ref 0.0–0.1)
Basophils Relative: 1 %
Eosinophils Absolute: 0.3 10*3/uL (ref 0.0–0.5)
Eosinophils Relative: 7 %
HCT: 34.3 % — ABNORMAL LOW (ref 36.0–46.0)
Hemoglobin: 11.2 g/dL — ABNORMAL LOW (ref 12.0–15.0)
Immature Granulocytes: 0 %
Lymphocytes Relative: 13 %
Lymphs Abs: 0.7 10*3/uL (ref 0.7–4.0)
MCH: 28.1 pg (ref 26.0–34.0)
MCHC: 32.7 g/dL (ref 30.0–36.0)
MCV: 86 fL (ref 80.0–100.0)
Monocytes Absolute: 0.3 10*3/uL (ref 0.1–1.0)
Monocytes Relative: 6 %
Neutro Abs: 3.8 10*3/uL (ref 1.7–7.7)
Neutrophils Relative %: 73 %
Platelet Count: 204 10*3/uL (ref 150–400)
RBC: 3.99 MIL/uL (ref 3.87–5.11)
RDW: 15.3 % (ref 11.5–15.5)
WBC Count: 5.2 10*3/uL (ref 4.0–10.5)
nRBC: 0 % (ref 0.0–0.2)

## 2020-10-15 LAB — CMP (CANCER CENTER ONLY)
ALT: 11 U/L (ref 0–44)
AST: 15 U/L (ref 15–41)
Albumin: 3.4 g/dL — ABNORMAL LOW (ref 3.5–5.0)
Alkaline Phosphatase: 59 U/L (ref 38–126)
Anion gap: 9 (ref 5–15)
BUN: 15 mg/dL (ref 8–23)
CO2: 22 mmol/L (ref 22–32)
Calcium: 9.6 mg/dL (ref 8.9–10.3)
Chloride: 108 mmol/L (ref 98–111)
Creatinine: 1.05 mg/dL — ABNORMAL HIGH (ref 0.44–1.00)
GFR, Estimated: 59 mL/min — ABNORMAL LOW (ref >60)
Glucose, Bld: 225 mg/dL — ABNORMAL HIGH (ref 70–99)
Potassium: 3.6 mmol/L (ref 3.5–5.1)
Sodium: 139 mmol/L (ref 135–145)
Total Bilirubin: 0.3 mg/dL (ref 0.3–1.2)
Total Protein: 6.8 g/dL (ref 6.5–8.1)

## 2020-10-15 LAB — TSH: TSH: 3.433 u[IU]/mL (ref 0.308–3.960)

## 2020-10-15 MED ORDER — SODIUM CHLORIDE 0.9 % IV SOLN
350.0000 mg | Freq: Once | INTRAVENOUS | Status: AC
  Administered 2020-10-15: 350 mg via INTRAVENOUS
  Filled 2020-10-15: qty 7

## 2020-10-15 MED ORDER — HEPARIN SOD (PORK) LOCK FLUSH 100 UNIT/ML IV SOLN
500.0000 [IU] | Freq: Once | INTRAVENOUS | Status: DC | PRN
  Filled 2020-10-15: qty 5

## 2020-10-15 MED ORDER — SODIUM CHLORIDE 0.9% FLUSH
10.0000 mL | INTRAVENOUS | Status: DC | PRN
Start: 2020-10-15 — End: 2020-10-15
  Filled 2020-10-15: qty 10

## 2020-10-15 MED ORDER — SODIUM CHLORIDE 0.9 % IV SOLN
Freq: Once | INTRAVENOUS | Status: AC
  Filled 2020-10-15: qty 250

## 2020-10-15 NOTE — Patient Instructions (Signed)
[facility]  Discharge Instructions: Thank you for choosing [facility] to provide your oncology and hematology care.   If you have a lab appointment with the [facility], please go directly to the [facility] and check in at the registration area.   Wear comfortable clothing and clothing appropriate for easy access to any Portacath or PICC line.   We strive to give you quality time with your provider. You may need to reschedule your appointment if you arrive late (15 or more minutes).  Arriving late affects you and other patients whose appointments are after yours.  Also, if you miss three or more appointments without notifying the office, you may be dismissed from the clinic at the provider's discretion.      For prescription refill requests, have your pharmacy contact our office and allow 72 hours for refills to be completed.    Today you received the following chemotherapy and/or immunotherapy agents libtayo      To help prevent nausea and vomiting after your treatment, we encourage you to take your nausea medication as directed.  BELOW ARE SYMPTOMS THAT SHOULD BE REPORTED IMMEDIATELY: *FEVER GREATER THAN 100.4 F (38 C) OR HIGHER *CHILLS OR SWEATING *NAUSEA AND VOMITING THAT IS NOT CONTROLLED WITH YOUR NAUSEA MEDICATION *UNUSUAL SHORTNESS OF BREATH *UNUSUAL BRUISING OR BLEEDING *URINARY PROBLEMS (pain or burning when urinating, or frequent urination) *BOWEL PROBLEMS (unusual diarrhea, constipation, pain near the anus) TENDERNESS IN MOUTH AND THROAT WITH OR WITHOUT PRESENCE OF ULCERS (sore throat, sores in mouth, or a toothache) UNUSUAL RASH, SWELLING OR PAIN  UNUSUAL VAGINAL DISCHARGE OR ITCHING   Items with * indicate a potential emergency and should be followed up as soon as possible or go to the Emergency Department if any problems should occur.  Please show the CHEMOTHERAPY ALERT CARD or IMMUNOTHERAPY ALERT CARD at check-in to the  Emergency Department and triage nurse.  Should you have questions after your visit or need to cancel or reschedule your appointment, please contact [facility]  Dept: [phone]  and follow the prompts.  Office hours are 8:00 a.m. to 4:30 p.m. Monday - Friday. Please note that voicemails left after 4:00 p.m. may not be returned until the following business day.  We are closed weekends and major holidays. You have access to a nurse at all times for urgent questions. Please call the main number to the clinic Dept: [phone] and follow the prompts.   For any non-urgent questions, you may also contact your provider using MyChart. We now offer e-Visits for anyone 54 and older to request care online for non-urgent symptoms. For details visit [email or web address].   Also download the MyChart app! Go to the app store, search "MyChart", open the app, select [facility], and log in with your MyChart username and password.  Due to Covid, a mask is required upon entering the hospital/clinic. If you do not have a mask, one will be given to you upon arrival. For doctor visits, patients may have 1 support person aged 82 or older with them. For treatment visits, patients cannot have anyone with them due to current Covid guidelines and our immunocompromised population.

## 2020-10-31 ENCOUNTER — Other Ambulatory Visit: Payer: Self-pay | Admitting: Internal Medicine

## 2020-11-05 ENCOUNTER — Inpatient Hospital Stay (HOSPITAL_BASED_OUTPATIENT_CLINIC_OR_DEPARTMENT_OTHER): Payer: Medicare Other | Admitting: Internal Medicine

## 2020-11-05 ENCOUNTER — Inpatient Hospital Stay: Payer: Medicare Other

## 2020-11-05 ENCOUNTER — Other Ambulatory Visit: Payer: Self-pay

## 2020-11-05 VITALS — BP 120/67 | HR 58 | Temp 96.5°F | Resp 18 | Ht 64.0 in | Wt 150.6 lb

## 2020-11-05 DIAGNOSIS — Z5112 Encounter for antineoplastic immunotherapy: Secondary | ICD-10-CM | POA: Diagnosis not present

## 2020-11-05 DIAGNOSIS — C3492 Malignant neoplasm of unspecified part of left bronchus or lung: Secondary | ICD-10-CM

## 2020-11-05 DIAGNOSIS — R5382 Chronic fatigue, unspecified: Secondary | ICD-10-CM

## 2020-11-05 LAB — CBC WITH DIFFERENTIAL (CANCER CENTER ONLY)
Abs Immature Granulocytes: 0.01 10*3/uL (ref 0.00–0.07)
Basophils Absolute: 0.1 10*3/uL (ref 0.0–0.1)
Basophils Relative: 1 %
Eosinophils Absolute: 0.4 10*3/uL (ref 0.0–0.5)
Eosinophils Relative: 8 %
HCT: 37.6 % (ref 36.0–46.0)
Hemoglobin: 12 g/dL (ref 12.0–15.0)
Immature Granulocytes: 0 %
Lymphocytes Relative: 14 %
Lymphs Abs: 0.7 10*3/uL (ref 0.7–4.0)
MCH: 28.1 pg (ref 26.0–34.0)
MCHC: 31.9 g/dL (ref 30.0–36.0)
MCV: 88.1 fL (ref 80.0–100.0)
Monocytes Absolute: 0.3 10*3/uL (ref 0.1–1.0)
Monocytes Relative: 7 %
Neutro Abs: 3.4 10*3/uL (ref 1.7–7.7)
Neutrophils Relative %: 70 %
Platelet Count: 258 10*3/uL (ref 150–400)
RBC: 4.27 MIL/uL (ref 3.87–5.11)
RDW: 16 % — ABNORMAL HIGH (ref 11.5–15.5)
WBC Count: 4.9 10*3/uL (ref 4.0–10.5)
nRBC: 0 % (ref 0.0–0.2)

## 2020-11-05 LAB — TSH: TSH: 2.54 u[IU]/mL (ref 0.308–3.960)

## 2020-11-05 LAB — CMP (CANCER CENTER ONLY)
ALT: 14 U/L (ref 0–44)
AST: 16 U/L (ref 15–41)
Albumin: 3.6 g/dL (ref 3.5–5.0)
Alkaline Phosphatase: 68 U/L (ref 38–126)
Anion gap: 10 (ref 5–15)
BUN: 13 mg/dL (ref 8–23)
CO2: 21 mmol/L — ABNORMAL LOW (ref 22–32)
Calcium: 9.4 mg/dL (ref 8.9–10.3)
Chloride: 108 mmol/L (ref 98–111)
Creatinine: 1.02 mg/dL — ABNORMAL HIGH (ref 0.44–1.00)
GFR, Estimated: >60 mL/min (ref >60)
Glucose, Bld: 171 mg/dL — ABNORMAL HIGH (ref 70–99)
Potassium: 3.5 mmol/L (ref 3.5–5.1)
Sodium: 139 mmol/L (ref 135–145)
Total Bilirubin: 0.4 mg/dL (ref 0.3–1.2)
Total Protein: 6.7 g/dL (ref 6.5–8.1)

## 2020-11-05 MED ORDER — SODIUM CHLORIDE 0.9 % IV SOLN
Freq: Once | INTRAVENOUS | Status: AC
Start: 2020-11-05 — End: 2020-11-05

## 2020-11-05 MED ORDER — SODIUM CHLORIDE 0.9 % IV SOLN
350.0000 mg | Freq: Once | INTRAVENOUS | Status: AC
  Administered 2020-11-05: 350 mg via INTRAVENOUS
  Filled 2020-11-05: qty 7

## 2020-11-05 NOTE — Progress Notes (Signed)
[facility] [phone]   [phone]  OFFICE PROGRESS NOTE  Gabrielle Dandy, NP [place] [address]  DIAGNOSIS: stage IV (T3, N0, M1 a) non-small cell lung cancer, adenocarcinoma presented with large left lower lobe lung mass in addition to another lingual mass and malignant left pleural effusion diagnosed in September 2021  Biomarker Findings Tumor Mutational Burden - 13 Muts/Mb Microsatellite status - MS-Stable Genomic Findings For a complete list of the genes assayed, please refer to the Appendix. KRAS G12C PRDM1 D267f*46 TERT promoter -146C>T TP53 V157F 7 Disease relevant genes with no reportable alterations: ALK, BRAF, EGFR, ERBB2, MET, RET, ROS1   PDL1 Expression: 95%  PRIOR THERAPY: None.  CURRENT THERAPY: Libtayo (Cempilimab) 350 mg IV every 3 weeks.  First dose January 15, 2020.  Status post 14 cycles.  INTERVAL HISTORY: Lakeyta H Sciortino 65y.o. female returns to the clinic today for follow-up visit accompanied by her husband.  The patient is feeling fine today with no concerning complaints except for few episodes of diarrhea on daily basis.  She takes Imodium and it does help.  She denied having any current chest pain, shortness of breath, cough or hemoptysis.  She denied having any fever or chills.  She has no nausea, vomiting, or constipation.  She continues to tolerate her treatment with Libtayo (Cempilimab) fairly well.  The patient is here today for evaluation before starting cycle #15.   MEDICAL HISTORY: Past Medical History:  Diagnosis Date   Hyperlipidemia    Hypertension    Pleural effusion    Pneumonia    Stage IV adenocarcinoma of lung, left (HCC)     ALLERGIES:  is allergic to ace inhibitors.  MEDICATIONS:  Current Outpatient Medications  Medication Sig Dispense Refill   cholecalciferol (VITAMIN D3) 25 MCG (1000 UNIT) tablet Take 2,000 Units by mouth daily.     levothyroxine  (SYNTHROID) 50 MCG tablet TAKE 1 TABLET BY MOUTH EVERY DAY BEFORE BREAKFAST 90 tablet 0   losartan-hydrochlorothiazide (HYZAAR) 100-12.5 MG tablet Take 1 tablet by mouth daily.     metFORMIN (GLUCOPHAGE) 500 MG tablet Take 500 mg by mouth 2 (two) times daily with a meal.     pravastatin (PRAVACHOL) 40 MG tablet Take 40 mg by mouth daily.     prochlorperazine (COMPAZINE) 10 MG tablet Take 1 tablet (10 mg total) by mouth every 6 (six) hours as needed for nausea or vomiting. 30 tablet 0   sertraline (ZOLOFT) 50 MG tablet Take 75 mg by mouth daily.     No current facility-administered medications for this visit.    SURGICAL HISTORY:  Past Surgical History:  Procedure Laterality Date   NEPHRECTOMY Left     REVIEW OF SYSTEMS:  A comprehensive review of systems was negative except for: Gastrointestinal: positive for diarrhea   PHYSICAL EXAMINATION: General appearance: alert, cooperative, and no distress Head: Normocephalic, without obvious abnormality, atraumatic Neck: no adenopathy, no JVD, supple, symmetrical, trachea midline, and thyroid not enlarged, symmetric, no tenderness/mass/nodules Lymph nodes: Cervical, supraclavicular, and axillary nodes normal. Resp: clear to auscultation bilaterally Back: symmetric, no curvature. ROM normal. No CVA tenderness. Cardio: regular rate and rhythm, S1, S2 normal, no murmur, click, rub or gallop GI: soft, non-tender; bowel sounds normal; no masses,  no organomegaly Extremities: extremities normal, atraumatic, no cyanosis or edema  ECOG PERFORMANCE STATUS: 1 - Symptomatic but completely ambulatory  Blood pressure 120/67, pulse (!) 58, temperature (!) 96.5 F (  35.8 C), temperature source Tympanic, resp. rate 18, height _0  (1.626 m), weight 150 lb 9.6 oz (68.3 kg), SpO2 97 %.  LABORATORY DATA: Lab Results  Component Value Date   WBC 4.9 11/05/2020   HGB 12.0 11/05/2020   HCT 37.6 11/05/2020   MCV 88.1 11/05/2020   PLT 258 11/05/2020       Chemistry      Component Value Date/Time   NA 139 10/15/2020 1031   K 3.6 10/15/2020 1031   CL 108 10/15/2020 1031   CO2 22 10/15/2020 1031   BUN 15 10/15/2020 1031   CREATININE 1.05 (H) 10/15/2020 1031      Component Value Date/Time   CALCIUM 9.6 10/15/2020 1031   ALKPHOS 59 10/15/2020 1031   AST 15 10/15/2020 1031   ALT 11 10/15/2020 1031   BILITOT 0.3 10/15/2020 1031       RADIOGRAPHIC STUDIES: No results found.   ASSESSMENT AND PLAN: This is a very pleasant 65 years old white female recently diagnosed with a stage IV (T3, N0, M1 a) non-small cell lung cancer, adenocarcinoma presented with large left lower lobe lung mass in addition to another lingular mass and malignant left pleural effusion diagnosed in September 2021.  The molecular studies showed positive PD-L1 expression of 95%.  The patient also has K-ras G12C mutation which will be an option for the second line treatment. With the high PD-L1 expression I discussed with the patient treatment with single agent Libtayo (Cempilimab) 350 mg IV every 3 weeks for up to 2 years unless the patient has evidence for disease progression or unacceptable toxicity. The patient started the first cycle of this treatment on January 15, 2020. She is status post 14 cycles.  The patient continues to tolerate her treatment well except for the few episodes of diarrhea. I recommended for her to proceed with cycle #15 today as planned. I will see the patient back for follow-up visit in 3 weeks for evaluation before starting cycle #16. For the diarrhea she will continue using Imodium for now but if it continues to get worse, will consider her for treatment with steroids. The patient was advised to call immediately if she has any concerning symptoms in the interval. The patient voices understanding of current disease status and treatment options and is in agreement with the current care plan.  All questions were answered. The patient knows to call  the clinic with any problems, questions or concerns. We can certainly see the patient much sooner if necessary.   Disclaimer: This note was dictated with voice recognition software. Similar sounding words can inadvertently be transcribed and may not be corrected upon review.

## 2020-11-05 NOTE — Patient Instructions (Signed)
[facility]  Discharge Instructions: Thank you for choosing [facility] to provide your oncology and hematology care.   If you have a lab appointment with the [facility], please go directly to the [facility] and check in at the registration area.   Wear comfortable clothing and clothing appropriate for easy access to any Portacath or PICC line.   We strive to give you quality time with your provider. You may need to reschedule your appointment if you arrive late (15 or more minutes).  Arriving late affects you and other patients whose appointments are after yours.  Also, if you miss three or more appointments without notifying the office, you may be dismissed from the clinic at the provider's discretion.      For prescription refill requests, have your pharmacy contact our office and allow 72 hours for refills to be completed.    Today you received the following chemotherapy and/or immunotherapy agents libtayo      To help prevent nausea and vomiting after your treatment, we encourage you to take your nausea medication as directed.  BELOW ARE SYMPTOMS THAT SHOULD BE REPORTED IMMEDIATELY: *FEVER GREATER THAN 100.4 F (38 C) OR HIGHER *CHILLS OR SWEATING *NAUSEA AND VOMITING THAT IS NOT CONTROLLED WITH YOUR NAUSEA MEDICATION *UNUSUAL SHORTNESS OF BREATH *UNUSUAL BRUISING OR BLEEDING *URINARY PROBLEMS (pain or burning when urinating, or frequent urination) *BOWEL PROBLEMS (unusual diarrhea, constipation, pain near the anus) TENDERNESS IN MOUTH AND THROAT WITH OR WITHOUT PRESENCE OF ULCERS (sore throat, sores in mouth, or a toothache) UNUSUAL RASH, SWELLING OR PAIN  UNUSUAL VAGINAL DISCHARGE OR ITCHING   Items with * indicate a potential emergency and should be followed up as soon as possible or go to the Emergency Department if any problems should occur.  Please show the CHEMOTHERAPY ALERT CARD or IMMUNOTHERAPY ALERT CARD at check-in to the  Emergency Department and triage nurse.  Should you have questions after your visit or need to cancel or reschedule your appointment, please contact [facility]  Dept: [phone]  and follow the prompts.  Office hours are 8:00 a.m. to 4:30 p.m. Monday - Friday. Please note that voicemails left after 4:00 p.m. may not be returned until the following business day.  We are closed weekends and major holidays. You have access to a nurse at all times for urgent questions. Please call the main number to the clinic Dept: [phone] and follow the prompts.   For any non-urgent questions, you may also contact your provider using MyChart. We now offer e-Visits for anyone 35 and older to request care online for non-urgent symptoms. For details visit [email or web address].   Also download the MyChart app! Go to the app store, search "MyChart", open the app, select [facility], and log in with your MyChart username and password.  Due to Covid, a mask is required upon entering the hospital/clinic. If you do not have a mask, one will be given to you upon arrival. For doctor visits, patients may have 1 support person aged 69 or older with them. For treatment visits, patients cannot have anyone with them due to current Covid guidelines and our immunocompromised population.

## 2020-11-22 NOTE — Progress Notes (Signed)
[facility] OFFICE PROGRESS NOTE  [name], NP [address] [place] [address]  DIAGNOSIS: Stage IV (T3, N0, M1 a) non-small cell lung cancer, adenocarcinoma presented with large left lower lobe lung mass in addition to another lingual mass and malignant left pleural effusion diagnosed in September 2021   Biomarker Findings Tumor Mutational Burden - 13 Muts/Mb Microsatellite status - MS-Stable Genomic Findings For a complete list of the genes assayed, please refer to the Appendix. KRAS G12C PRDM1 D267f*46 TERT promoter -146C>T TP53 V157F 7 Disease relevant genes with no reportable alterations: ALK, BRAF, EGFR, ERBB2, MET, RET, ROS1   PDL1 Expression: 95%  PRIOR THERAPY: None  CURRENT THERAPY:  Libtayo (Cempilimab) 350 mg IV every 3 weeks.  First dose January 15, 2020.  Status post 15 cycles   [name] 65y.o. female returns returns to the clinic for a follow up visit accompanied by her husband. The patient is feeling well today without any concerning complaints. The patient continues to tolerate treatment with immunotherapy with Libtayo well without any adverse side effects except she does have some scattered skin lesions which are consistent with immunotherapy mediated although these are getting better recently. These do not bother her. She also takes benadryl if needed. Denies any fever, chills, or night sweats. Denies any chest pain, cough, or hemoptysis. She denies any significant shortness of breath. She has had a stable pleural effusion which she is not very symptomatic from. Denies any nausea, vomiting, or constipation. She continues to have occasional diarrhea which is not daily. Most of the time, her diarrhea is in the morning. Imodium helps. On days she has diarrhea, she can have anywhere from 1-3 loose stools. Denies any headache or visual changes. The patient is here today for evaluation prior to starting cycle #  16.  MEDICAL HISTORY: Past Medical History:  Diagnosis Date   Hyperlipidemia    Hypertension    Pleural effusion    Pneumonia    Stage IV adenocarcinoma of lung, left (HCC)     ALLERGIES:  is allergic to ace inhibitors.  MEDICATIONS:  Current Outpatient Medications  Medication Sig Dispense Refill   cholecalciferol (VITAMIN D3) 25 MCG (1000 UNIT) tablet Take 2,000 Units by mouth daily.     levothyroxine (SYNTHROID) 50 MCG tablet TAKE 1 TABLET BY MOUTH EVERY DAY BEFORE BREAKFAST 90 tablet 0   losartan-hydrochlorothiazide (HYZAAR) 100-12.5 MG tablet Take 1 tablet by mouth daily.     metFORMIN (GLUCOPHAGE) 500 MG tablet Take 500 mg by mouth 2 (two) times daily with a meal.     pravastatin (PRAVACHOL) 40 MG tablet Take 40 mg by mouth daily.     prochlorperazine (COMPAZINE) 10 MG tablet Take 1 tablet (10 mg total) by mouth every 6 (six) hours as needed for nausea or vomiting. 30 tablet 0   sertraline (ZOLOFT) 50 MG tablet Take 75 mg by mouth daily.     No current facility-administered medications for this visit.    SURGICAL HISTORY:  Past Surgical History:  Procedure Laterality Date   NEPHRECTOMY Left     REVIEW OF SYSTEMS:   Review of Systems  Constitutional: Negative for appetite change, chills, fatigue, fever and unexpected weight change.  HENT: Negative for mouth sores, nosebleeds, sore throat and trouble swallowing.   Eyes: Negative for eye problems and icterus.  Respiratory: Negative for cough, hemoptysis, shortness of breath and wheezing.   Cardiovascular: Negative for chest pain and leg swelling.  Gastrointestinal: Positive for intermittent diarrhea. Negative for abdominal pain, constipation, nausea and vomiting.  Genitourinary: Negative for bladder incontinence, difficulty urinating, dysuria, frequency and hematuria.   Musculoskeletal: Negative for back pain, gait problem, neck pain and neck stiffness.  Skin: Positive for intermittent skin lesions (improved from prior).   Neurological: Negative for dizziness, extremity weakness, gait problem, headaches, light-headedness and seizures.  Hematological: Negative for adenopathy. Does not bruise/bleed easily.  Psychiatric/Behavioral: Negative for confusion, depression and sleep disturbance. The patient is not nervous/anxious.  PHYSICAL EXAMINATION:  There were no vitals taken for this visit.  ECOG PERFORMANCE STATUS: 1  Physical Exam  CConstitutional: Oriented to person, place, and time and well-developed, well-nourished, and in no distress.   HENT: Head: Normocephalic and atraumatic. Mouth/Throat: Oropharynx is clear and moist. No oropharyngeal exudate. Eyes: Conjunctivae are normal. Right eye exhibits no discharge. Left eye exhibits no discharge. No scleral icterus. Neck: Normal range of motion. Neck supple. Cardiovascular: Normal rate, regular rhythm, normal heart sounds and intact distal pulses.   Pulmonary/Chest: Effort normal and breath sounds normal. No respiratory distress. No wheezes. No rales. Abdominal: Soft. Bowel sounds are normal. Exhibits no distension and no mass. There is no tenderness.  Musculoskeletal: Normal range of motion. Exhibits no edema.  Lymphadenopathy:    No cervical adenopathy.  Neurological: Alert and oriented to person, place, and time. Exhibits normal muscle tone. Gait normal. Coordination normal. Skin: Some dry small scattered skin lesions on chest and extremities (improved from prior). Skin is warm and dry. Not diaphoretic. No erythema. No pallor.  Psychiatric: Mood, memory and judgment normal. Vitals reviewed.  LABORATORY DATA: Lab Results  Component Value Date   WBC 4.9 11/05/2020   HGB 12.0 11/05/2020   HCT 37.6 11/05/2020   MCV 88.1 11/05/2020   PLT 258 11/05/2020      Chemistry      Component Value Date/Time   NA 139 11/05/2020 0905   K 3.5 11/05/2020 0905   CL 108 11/05/2020 0905   CO2 21 (L) 11/05/2020 0905   BUN 13 11/05/2020 0905   CREATININE 1.02  (H) 11/05/2020 0905      Component Value Date/Time   CALCIUM 9.4 11/05/2020 0905   ALKPHOS 68 11/05/2020 0905   AST 16 11/05/2020 0905   ALT 14 11/05/2020 0905   BILITOT 0.4 11/05/2020 0905       RADIOGRAPHIC STUDIES:  No results found.   ASSESSMENT/PLAN:  This is a very pleasant 65 year old Caucasian female diagnosed with stage IV (T3, N0, M1A) non-small cell lung cancer, adenocarcinoma.  She presented with a large left lower lobe lung mass in addition to another lingular mass and a malignant left pleural effusion.  She was diagnosed in September 2021.  Her molecular studies show that she has a PD-L1 expression at 95%.  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  The patient is currently undergoing treatment with single agent Libtayo (Cempilimab) 350 mg IV every 3 weeks for up to 2 years unless the patient has evidence for disease progression or unacceptable toxicity.  She is status post 15 cycles and tolerated it well without any concerning adverse side effects.    Labs were reviewed.  Recommend that she proceed with cycle #16 today scheduled.  I will arrange for restaging CT scan of the chest, abdomen, and pelvis prior to starting her neck cycle of treatment.   We will see her back for follow-up visit in 3 weeks  for evaluation and to review her scan results before starting cycle #17.  She was instructed to use Imodium if needed for diarrhea.  If she continues to have significant diarrhea despite taking Imodium she was advised to call us for further steps in management.  She will continue to use hydrocortisone cream if needed for her rash and Benadryl if needed.  The patient was advised to call immediately if she has any concerning symptoms in the interval. The patient voices understanding of current disease status and treatment options and is in agreement with the current care plan. All questions were answered. The patient knows  to call the clinic with any problems, questions or concerns. We can certainly see the patient much sooner if necessary         No orders of the defined types were placed in this encounter.     The total time spent in the appointment was 20-29 minutes.   Gabrielle Lukic L Niels Cranshaw, PA-C 11/22/20

## 2020-11-26 ENCOUNTER — Inpatient Hospital Stay (HOSPITAL_BASED_OUTPATIENT_CLINIC_OR_DEPARTMENT_OTHER): Payer: Medicare Other | Admitting: Physician Assistant

## 2020-11-26 ENCOUNTER — Inpatient Hospital Stay: Payer: Medicare Other

## 2020-11-26 ENCOUNTER — Inpatient Hospital Stay: Payer: Medicare Other | Attending: Internal Medicine

## 2020-11-26 ENCOUNTER — Other Ambulatory Visit: Payer: Self-pay

## 2020-11-26 VITALS — BP 135/73 | HR 58 | Temp 98.0°F | Resp 17 | Wt 151.5 lb

## 2020-11-26 DIAGNOSIS — I10 Essential (primary) hypertension: Secondary | ICD-10-CM | POA: Diagnosis not present

## 2020-11-26 DIAGNOSIS — C3492 Malignant neoplasm of unspecified part of left bronchus or lung: Secondary | ICD-10-CM

## 2020-11-26 DIAGNOSIS — Z905 Acquired absence of kidney: Secondary | ICD-10-CM | POA: Insufficient documentation

## 2020-11-26 DIAGNOSIS — Z79899 Other long term (current) drug therapy: Secondary | ICD-10-CM | POA: Diagnosis not present

## 2020-11-26 DIAGNOSIS — Z5112 Encounter for antineoplastic immunotherapy: Secondary | ICD-10-CM

## 2020-11-26 DIAGNOSIS — C3432 Malignant neoplasm of lower lobe, left bronchus or lung: Secondary | ICD-10-CM | POA: Diagnosis present

## 2020-11-26 DIAGNOSIS — E785 Hyperlipidemia, unspecified: Secondary | ICD-10-CM | POA: Diagnosis not present

## 2020-11-26 DIAGNOSIS — J910 Malignant pleural effusion: Secondary | ICD-10-CM | POA: Diagnosis not present

## 2020-11-26 DIAGNOSIS — R5382 Chronic fatigue, unspecified: Secondary | ICD-10-CM

## 2020-11-26 DIAGNOSIS — Z7984 Long term (current) use of oral hypoglycemic drugs: Secondary | ICD-10-CM | POA: Diagnosis not present

## 2020-11-26 LAB — CBC WITH DIFFERENTIAL (CANCER CENTER ONLY)
Abs Immature Granulocytes: 0.02 10*3/uL (ref 0.00–0.07)
Basophils Absolute: 0.1 10*3/uL (ref 0.0–0.1)
Basophils Relative: 1 %
Eosinophils Absolute: 0.5 10*3/uL (ref 0.0–0.5)
Eosinophils Relative: 10 %
HCT: 34.1 % — ABNORMAL LOW (ref 36.0–46.0)
Hemoglobin: 11 g/dL — ABNORMAL LOW (ref 12.0–15.0)
Immature Granulocytes: 0 %
Lymphocytes Relative: 17 %
Lymphs Abs: 0.8 10*3/uL (ref 0.7–4.0)
MCH: 28.6 pg (ref 26.0–34.0)
MCHC: 32.3 g/dL (ref 30.0–36.0)
MCV: 88.8 fL (ref 80.0–100.0)
Monocytes Absolute: 0.3 10*3/uL (ref 0.1–1.0)
Monocytes Relative: 6 %
Neutro Abs: 3.1 10*3/uL (ref 1.7–7.7)
Neutrophils Relative %: 66 %
Platelet Count: 254 10*3/uL (ref 150–400)
RBC: 3.84 MIL/uL — ABNORMAL LOW (ref 3.87–5.11)
RDW: 15.8 % — ABNORMAL HIGH (ref 11.5–15.5)
WBC Count: 4.7 10*3/uL (ref 4.0–10.5)
nRBC: 0 % (ref 0.0–0.2)

## 2020-11-26 LAB — CMP (CANCER CENTER ONLY)
ALT: 14 U/L (ref 0–44)
AST: 15 U/L (ref 15–41)
Albumin: 3.2 g/dL — ABNORMAL LOW (ref 3.5–5.0)
Alkaline Phosphatase: 63 U/L (ref 38–126)
Anion gap: 10 (ref 5–15)
BUN: 13 mg/dL (ref 8–23)
CO2: 20 mmol/L — ABNORMAL LOW (ref 22–32)
Calcium: 9 mg/dL (ref 8.9–10.3)
Chloride: 110 mmol/L (ref 98–111)
Creatinine: 0.92 mg/dL (ref 0.44–1.00)
GFR, Estimated: >60 mL/min (ref >60)
Glucose, Bld: 180 mg/dL — ABNORMAL HIGH (ref 70–99)
Potassium: 3.5 mmol/L (ref 3.5–5.1)
Sodium: 140 mmol/L (ref 135–145)
Total Bilirubin: 0.3 mg/dL (ref 0.3–1.2)
Total Protein: 6.4 g/dL — ABNORMAL LOW (ref 6.5–8.1)

## 2020-11-26 LAB — TSH: TSH: 3.513 u[IU]/mL (ref 0.308–3.960)

## 2020-11-26 MED ORDER — SODIUM CHLORIDE 0.9 % IV SOLN: Freq: Once | INTRAVENOUS | Status: AC

## 2020-11-26 MED ORDER — SODIUM CHLORIDE 0.9 % IV SOLN
350.0000 mg | Freq: Once | INTRAVENOUS | Status: AC
  Administered 2020-11-26: 350 mg via INTRAVENOUS
  Filled 2020-11-26: qty 7

## 2020-11-26 NOTE — Patient Instructions (Signed)
[facility]   Discharge Instructions: Thank you for choosing [facility] to provide your oncology and hematology care.   If you have a lab appointment with the [facility], please go directly to the [facility] and check in at the registration area.   Wear comfortable clothing and clothing appropriate for easy access to any Portacath or PICC line.   We strive to give you quality time with your provider. You may need to reschedule your appointment if you arrive late (15 or more minutes).  Arriving late affects you and other patients whose appointments are after yours.  Also, if you miss three or more appointments without notifying the office, you may be dismissed from the clinic at the provider's discretion.      For prescription refill requests, have your pharmacy contact our office and allow 72 hours for refills to be completed.    Today you received the following chemotherapy and/or immunotherapy agents: Cemiplimab (Libtayo)      To help prevent nausea and vomiting after your treatment, we encourage you to take your nausea medication as directed.  BELOW ARE SYMPTOMS THAT SHOULD BE REPORTED IMMEDIATELY: *FEVER GREATER THAN 100.4 F (38 C) OR HIGHER *CHILLS OR SWEATING *NAUSEA AND VOMITING THAT IS NOT CONTROLLED WITH YOUR NAUSEA MEDICATION *UNUSUAL SHORTNESS OF BREATH *UNUSUAL BRUISING OR BLEEDING *URINARY PROBLEMS (pain or burning when urinating, or frequent urination) *BOWEL PROBLEMS (unusual diarrhea, constipation, pain near the anus) TENDERNESS IN MOUTH AND THROAT WITH OR WITHOUT PRESENCE OF ULCERS (sore throat, sores in mouth, or a toothache) UNUSUAL RASH, SWELLING OR PAIN  UNUSUAL VAGINAL DISCHARGE OR ITCHING   Items with * indicate a potential emergency and should be followed up as soon as possible or go to the Emergency Department if any problems should occur.  Please show the CHEMOTHERAPY ALERT CARD or IMMUNOTHERAPY ALERT CARD at  check-in to the Emergency Department and triage nurse.  Should you have questions after your visit or need to cancel or reschedule your appointment, please contact [facility]  Dept: [phone]  and follow the prompts.  Office hours are 8:00 a.m. to 4:30 p.m. Monday - Friday. Please note that voicemails left after 4:00 p.m. may not be returned until the following business day.  We are closed weekends and major holidays. You have access to a nurse at all times for urgent questions. Please call the main number to the clinic Dept: [phone] and follow the prompts.   For any non-urgent questions, you may also contact your provider using MyChart. We now offer e-Visits for anyone 30 and older to request care online for non-urgent symptoms. For details visit [email or web address].   Also download the MyChart app! Go to the app store, search "MyChart", open the app, select [facility], and log in with your MyChart username and password.  Due to Covid, a mask is required upon entering the hospital/clinic. If you do not have a mask, one will be given to you upon arrival. For doctor visits, patients may have 1 support person aged 59 or older with them. For treatment visits, patients cannot have anyone with them due to current Covid guidelines and our immunocompromised population.

## 2020-12-16 ENCOUNTER — Ambulatory Visit (HOSPITAL_COMMUNITY)
Admission: RE | Admit: 2020-12-16 | Discharge: 2020-12-16 | Disposition: A | Discharge status: Home / Self Care | Payer: Medicare Other | Source: Ambulatory Visit | Attending: Physician Assistant | Admitting: Physician Assistant

## 2020-12-16 ENCOUNTER — Encounter (HOSPITAL_COMMUNITY): Payer: Self-pay

## 2020-12-16 DIAGNOSIS — C3492 Malignant neoplasm of unspecified part of left bronchus or lung: Secondary | ICD-10-CM | POA: Insufficient documentation

## 2020-12-16 MED ORDER — IOHEXOL 350 MG/ML SOLN
75.0000 mL | Freq: Once | INTRAVENOUS | Status: AC | PRN
  Administered 2020-12-16: 75 mL via INTRAVENOUS

## 2020-12-17 ENCOUNTER — Inpatient Hospital Stay: Payer: Medicare Other | Attending: Internal Medicine

## 2020-12-17 ENCOUNTER — Inpatient Hospital Stay: Payer: Medicare Other

## 2020-12-17 ENCOUNTER — Inpatient Hospital Stay (HOSPITAL_BASED_OUTPATIENT_CLINIC_OR_DEPARTMENT_OTHER): Payer: Medicare Other | Admitting: Internal Medicine

## 2020-12-17 ENCOUNTER — Other Ambulatory Visit: Payer: Self-pay

## 2020-12-17 VITALS — BP 135/66 | HR 53 | Temp 96.9°F | Resp 18 | Ht 64.0 in | Wt 147.7 lb

## 2020-12-17 DIAGNOSIS — J910 Malignant pleural effusion: Secondary | ICD-10-CM | POA: Insufficient documentation

## 2020-12-17 DIAGNOSIS — E785 Hyperlipidemia, unspecified: Secondary | ICD-10-CM | POA: Insufficient documentation

## 2020-12-17 DIAGNOSIS — Z7984 Long term (current) use of oral hypoglycemic drugs: Secondary | ICD-10-CM | POA: Insufficient documentation

## 2020-12-17 DIAGNOSIS — Z5112 Encounter for antineoplastic immunotherapy: Secondary | ICD-10-CM

## 2020-12-17 DIAGNOSIS — C3432 Malignant neoplasm of lower lobe, left bronchus or lung: Secondary | ICD-10-CM | POA: Diagnosis present

## 2020-12-17 DIAGNOSIS — C3492 Malignant neoplasm of unspecified part of left bronchus or lung: Secondary | ICD-10-CM

## 2020-12-17 DIAGNOSIS — Z79899 Other long term (current) drug therapy: Secondary | ICD-10-CM | POA: Diagnosis not present

## 2020-12-17 DIAGNOSIS — R5382 Chronic fatigue, unspecified: Secondary | ICD-10-CM

## 2020-12-17 LAB — CBC WITH DIFFERENTIAL (CANCER CENTER ONLY)
Abs Immature Granulocytes: 0.01 10*3/uL (ref 0.00–0.07)
Basophils Absolute: 0.1 10*3/uL (ref 0.0–0.1)
Basophils Relative: 2 %
Eosinophils Absolute: 0.3 10*3/uL (ref 0.0–0.5)
Eosinophils Relative: 8 %
HCT: 37.9 % (ref 36.0–46.0)
Hemoglobin: 12.1 g/dL (ref 12.0–15.0)
Immature Granulocytes: 0 %
Lymphocytes Relative: 16 %
Lymphs Abs: 0.7 10*3/uL (ref 0.7–4.0)
MCH: 28.6 pg (ref 26.0–34.0)
MCHC: 31.9 g/dL (ref 30.0–36.0)
MCV: 89.6 fL (ref 80.0–100.0)
Monocytes Absolute: 0.2 10*3/uL (ref 0.1–1.0)
Monocytes Relative: 6 %
Neutro Abs: 2.9 10*3/uL (ref 1.7–7.7)
Neutrophils Relative %: 68 %
Platelet Count: 206 10*3/uL (ref 150–400)
RBC: 4.23 MIL/uL (ref 3.87–5.11)
RDW: 15.2 % (ref 11.5–15.5)
WBC Count: 4.2 10*3/uL (ref 4.0–10.5)
nRBC: 0 % (ref 0.0–0.2)

## 2020-12-17 LAB — CMP (CANCER CENTER ONLY)
ALT: 13 U/L (ref 0–44)
AST: 18 U/L (ref 15–41)
Albumin: 3.5 g/dL (ref 3.5–5.0)
Alkaline Phosphatase: 63 U/L (ref 38–126)
Anion gap: 11 (ref 5–15)
BUN: 11 mg/dL (ref 8–23)
CO2: 23 mmol/L (ref 22–32)
Calcium: 9.3 mg/dL (ref 8.9–10.3)
Chloride: 107 mmol/L (ref 98–111)
Creatinine: 0.96 mg/dL (ref 0.44–1.00)
GFR, Estimated: >60 mL/min (ref >60)
Glucose, Bld: 127 mg/dL — ABNORMAL HIGH (ref 70–99)
Potassium: 3.6 mmol/L (ref 3.5–5.1)
Sodium: 141 mmol/L (ref 135–145)
Total Bilirubin: 0.3 mg/dL (ref 0.3–1.2)
Total Protein: 6.9 g/dL (ref 6.5–8.1)

## 2020-12-17 LAB — TSH: TSH: 4.542 u[IU]/mL — ABNORMAL HIGH (ref 0.308–3.960)

## 2020-12-17 MED ORDER — SODIUM CHLORIDE 0.9 % IV SOLN
350.0000 mg | Freq: Once | INTRAVENOUS | Status: AC
  Administered 2020-12-17: 350 mg via INTRAVENOUS
  Filled 2020-12-17: qty 7

## 2020-12-17 MED ORDER — SODIUM CHLORIDE 0.9 % IV SOLN: Freq: Once | INTRAVENOUS | Status: AC

## 2020-12-17 NOTE — Patient Instructions (Signed)
[facility]  Discharge Instructions: Thank you for choosing [facility] to provide your oncology and hematology care.   If you have a lab appointment with the [facility], please go directly to the [facility] and check in at the registration area.   Wear comfortable clothing and clothing appropriate for easy access to any Portacath or PICC line.   We strive to give you quality time with your provider. You may need to reschedule your appointment if you arrive late (15 or more minutes).  Arriving late affects you and other patients whose appointments are after yours.  Also, if you miss three or more appointments without notifying the office, you may be dismissed from the clinic at the provider's discretion.      For prescription refill requests, have your pharmacy contact our office and allow 72 hours for refills to be completed.    Today you received the following chemotherapy and/or immunotherapy agents libtayo      To help prevent nausea and vomiting after your treatment, we encourage you to take your nausea medication as directed.  BELOW ARE SYMPTOMS THAT SHOULD BE REPORTED IMMEDIATELY: *FEVER GREATER THAN 100.4 F (38 C) OR HIGHER *CHILLS OR SWEATING *NAUSEA AND VOMITING THAT IS NOT CONTROLLED WITH YOUR NAUSEA MEDICATION *UNUSUAL SHORTNESS OF BREATH *UNUSUAL BRUISING OR BLEEDING *URINARY PROBLEMS (pain or burning when urinating, or frequent urination) *BOWEL PROBLEMS (unusual diarrhea, constipation, pain near the anus) TENDERNESS IN MOUTH AND THROAT WITH OR WITHOUT PRESENCE OF ULCERS (sore throat, sores in mouth, or a toothache) UNUSUAL RASH, SWELLING OR PAIN  UNUSUAL VAGINAL DISCHARGE OR ITCHING   Items with * indicate a potential emergency and should be followed up as soon as possible or go to the Emergency Department if any problems should occur.  Please show the CHEMOTHERAPY ALERT CARD or IMMUNOTHERAPY ALERT CARD at check-in to the  Emergency Department and triage nurse.  Should you have questions after your visit or need to cancel or reschedule your appointment, please contact [facility]  Dept: [phone]  and follow the prompts.  Office hours are 8:00 a.m. to 4:30 p.m. Monday - Friday. Please note that voicemails left after 4:00 p.m. may not be returned until the following business day.  We are closed weekends and major holidays. You have access to a nurse at all times for urgent questions. Please call the main number to the clinic Dept: [phone] and follow the prompts.   For any non-urgent questions, you may also contact your provider using MyChart. We now offer e-Visits for anyone 66 and older to request care online for non-urgent symptoms. For details visit [email or web address].   Also download the MyChart app! Go to the app store, search "MyChart", open the app, select [facility], and log in with your MyChart username and password.  Due to Covid, a mask is required upon entering the hospital/clinic. If you do not have a mask, one will be given to you upon arrival. For doctor visits, patients may have 1 support person aged 66 or older with them. For treatment visits, patients cannot have anyone with them due to current Covid guidelines and our immunocompromised population.

## 2020-12-17 NOTE — Progress Notes (Signed)
Gabrielle [phone]   [phone] [name]  OFFICE PROGRESS NOTE  Gabrielle Dandy, Gabrielle [name] [address]  DIAGNOSIS: stage IV (T3, N0, M1 a) non-small cell lung cancer, adenocarcinoma presented with large left lower lobe lung mass in addition to another lingual mass and malignant left pleural effusion diagnosed in September 2021  Biomarker Findings Tumor Mutational Burden - 13 Muts/Mb Microsatellite status - MS-Stable Genomic Findings For a complete list of the genes assayed, please refer to the Appendix. KRAS G12C PRDM1 D251f*46 TERT promoter -146C>T TP53 V157F 7 Disease relevant genes with no reportable alterations: ALK, BRAF, EGFR, ERBB2, MET, RET, ROS1   PDL1 Expression: 95%  PRIOR THERAPY: None.  CURRENT THERAPY: Libtayo (Cempilimab) 350 mg IV every 3 weeks.  First dose January 15, 2020.  Status post 15 cycles.  [name] 65y.o. female returns to the clinic today for follow-up visit accompanied by her husband.  The patient is feeling fine today with no concerning complaints.  Her diarrhea is much improved with the Imodium treatment.  She denied having any current chest pain, shortness of breath, cough or hemoptysis.  She denied having any fever or chills.  She has no nausea, vomiting, diarrhea or constipation.  She has no headache or visual changes.  She has been tolerating her treatment with immunotherapy fairly well.  The patient had repeat CT scan of the chest, abdomen pelvis performed recently and she is here for evaluation and discussion of her scan results.   MEDICAL HISTORY: Past Medical History:  Diagnosis Date   Hyperlipidemia    Hypertension    Pleural effusion    Pneumonia    Stage IV adenocarcinoma of lung, left (HCC)     ALLERGIES:  is allergic to ace inhibitors.  MEDICATIONS:  Current Outpatient Medications  Medication Sig Dispense Refill   cholecalciferol (VITAMIN  D3) 25 MCG (1000 UNIT) tablet Take 2,000 Units by mouth daily.     levothyroxine (SYNTHROID) 50 MCG tablet TAKE 1 TABLET BY MOUTH EVERY DAY BEFORE BREAKFAST 90 tablet 0   losartan-hydrochlorothiazide (HYZAAR) 100-12.5 MG tablet Take 1 tablet by mouth daily.     metFORMIN (GLUCOPHAGE) 500 MG tablet Take 500 mg by mouth 2 (two) times daily with a meal.     pravastatin (PRAVACHOL) 40 MG tablet Take 40 mg by mouth daily.     prochlorperazine (COMPAZINE) 10 MG tablet Take 1 tablet (10 mg total) by mouth every 6 (six) hours as needed for nausea or vomiting. 30 tablet 0   sertraline (ZOLOFT) 50 MG tablet Take 75 mg by mouth daily.     No current facility-administered medications for this visit.    SURGICAL HISTORY:  Past Surgical History:  Procedure Laterality Date   NEPHRECTOMY Left     REVIEW OF SYSTEMS:  Constitutional: negative Eyes: negative Ears, nose, mouth, throat, and face: negative Respiratory: negative Cardiovascular: negative Gastrointestinal: positive for diarrhea Genitourinary:negative Integument/breast: negative Hematologic/lymphatic: negative Musculoskeletal:negative Neurological: negative Behavioral/Psych: negative Endocrine: negative Allergic/Immunologic: negative   PHYSICAL EXAMINATION: General appearance: alert, cooperative, and no distress Head: Normocephalic, without obvious abnormality, atraumatic Neck: no adenopathy, no JVD, supple, symmetrical, trachea midline, and thyroid not enlarged, symmetric, no tenderness/mass/nodules Lymph nodes: Cervical, supraclavicular, and axillary nodes normal. Resp: clear to auscultation bilaterally Back: symmetric, no curvature. ROM normal. No CVA tenderness. Cardio: regular rate and rhythm, S1, S2 normal, no murmur, click, rub or gallop GI: soft, non-tender; bowel sounds  normal; no masses,  no organomegaly Extremities: extremities normal, atraumatic, no cyanosis or edema Neurologic: Alert and oriented X 3, normal strength and  tone. Normal symmetric reflexes. Normal coordination and gait  ECOG PERFORMANCE STATUS: 1 - Symptomatic but completely ambulatory  Blood pressure 135/66, pulse (!) 53, temperature (!) 96.9 F (36.1 C), temperature source Tympanic, resp. rate 18, height '5\' 4"'  (1.626 m), weight 147 lb 11.2 oz (67 kg), SpO2 99 %.  LABORATORY DATA: Lab Results  Component Value Date   WBC 4.2 12/17/2020   HGB 12.1 12/17/2020   HCT 37.9 12/17/2020   MCV 89.6 12/17/2020   PLT 206 12/17/2020      Chemistry      Component Value Date/Time   NA 141 12/17/2020 1023   K 3.6 12/17/2020 1023   CL 107 12/17/2020 1023   CO2 23 12/17/2020 1023   BUN 11 12/17/2020 1023   CREATININE 0.96 12/17/2020 1023      Component Value Date/Time   CALCIUM 9.3 12/17/2020 1023   ALKPHOS 63 12/17/2020 1023   AST 18 12/17/2020 1023   ALT 13 12/17/2020 1023   BILITOT 0.3 12/17/2020 1023       RADIOGRAPHIC STUDIES: CT Chest W Contrast  Result Date: 12/16/2020 CLINICAL DATA:  Metastatic lung cancer restaging, ongoing immunotherapy EXAM: CT CHEST, ABDOMEN, AND PELVIS WITH CONTRAST TECHNIQUE: Multidetector CT imaging of the chest, abdomen and pelvis was performed following the standard protocol during bolus administration of intravenous contrast. CONTRAST:  63m OMNIPAQUE IOHEXOL 350 MG/ML SOLN, additional oral enteric contrast COMPARISON:  09/19/2020 FINDINGS: CT CHEST FINDINGS Cardiovascular: Aortic atherosclerosis. Normal heart size. Three-vessel coronary artery calcifications. No pericardial effusion. Mediastinum/Nodes: Unchanged retrosternal/epicardial lymph node or soft tissue nodule measuring 1.2 x 1.0 cm (series 2, image 31). Additional unchanged subcentimeter epicardial lymph node or soft tissue nodule overlying the pulmonary outflow tract measuring 0.7 x 0.5 cm (series 2, image 30). Continued interval enlargement of right axillary lymph nodes, measuring up to 1.7 x 1.2 cm, previously 1.2 x 1.0 cm (series 2, image 21).  Small hiatal hernia. Thyroid gland, trachea, and esophagus demonstrate no significant findings. Lungs/Pleura: Unchanged volume loss of the left lung with a small, loculated pleural effusion, pleural thickening, and areas of rounded atelectasis of the dependent left lung (series 6, image 96). Small pulmonary nodules of the right upper lobe measuring no greater than 3 mm are unchanged (series 6, image 44, 41). Musculoskeletal: No chest wall mass or suspicious bone lesions identified. CT ABDOMEN PELVIS FINDINGS Hepatobiliary: No solid liver abnormality is seen. No gallstones, gallbladder wall thickening, or biliary dilatation. Pancreas: Unremarkable. No pancreatic ductal dilatation or surrounding inflammatory changes. Spleen: Normal in size without significant abnormality. Adrenals/Urinary Tract: Adrenal glands are unremarkable. Status post left nephrectomy. The right kidney is normal, without renal calculi, solid lesion, or hydronephrosis. Bladder is unremarkable. Stomach/Bowel: Stomach is within normal limits. Appendix appears normal. No evidence of bowel wall thickening, distention, or inflammatory changes. Vascular/Lymphatic: Aortic atherosclerosis. No enlarged abdominal or pelvic lymph nodes. Reproductive: No mass or other abnormality. Other: No abdominal wall hernia or abnormality. No abdominopelvic ascites. Musculoskeletal: No acute or significant osseous findings. IMPRESSION: 1. Continued interval enlargement of right axillary lymph nodes, measuring up to 1.7 x 1.2 cm, concerning for nodal metastatic disease. 2. Unchanged retrosternal/epicardial lymph node or soft tissue nodule measuring 1.2 x 1.0 cm. Additional unchanged subcentimeter epicardial lymph node or soft tissue nodule overlying the pulmonary outflow tract measuring 0.7 x 0.5 cm. 3. Unchanged volume loss of the  left lung with a small, loculated pleural effusion, pleural thickening, and areas of rounded atelectasis of the dependent left lung. Primary  lung malignancy is not discretely appreciated at present. 4. Unchanged small pulmonary nodules of the right upper lobe measuring no greater than 3 mm, almost certainly benign and incidental. Attention on follow-up. 5. No evidence of metastatic disease in the abdomen or pelvis. 6. Status post left nephrectomy. 7. Coronary artery disease. Aortic Atherosclerosis (ICD10-I70.0). Electronically Signed   By: Delanna Ahmadi M.D.   On: 12/16/2020 19:22   CT Abdomen Pelvis W Contrast  Result Date: 12/16/2020 CLINICAL DATA:  Metastatic lung cancer restaging, ongoing immunotherapy EXAM: CT CHEST, ABDOMEN, AND PELVIS WITH CONTRAST TECHNIQUE: Multidetector CT imaging of the chest, abdomen and pelvis was performed following the standard protocol during bolus administration of intravenous contrast. CONTRAST:  86m OMNIPAQUE IOHEXOL 350 MG/ML SOLN, additional oral enteric contrast COMPARISON:  09/19/2020 FINDINGS: CT CHEST FINDINGS Cardiovascular: Aortic atherosclerosis. Normal heart size. Three-vessel coronary artery calcifications. No pericardial effusion. Mediastinum/Nodes: Unchanged retrosternal/epicardial lymph node or soft tissue nodule measuring 1.2 x 1.0 cm (series 2, image 31). Additional unchanged subcentimeter epicardial lymph node or soft tissue nodule overlying the pulmonary outflow tract measuring 0.7 x 0.5 cm (series 2, image 30). Continued interval enlargement of right axillary lymph nodes, measuring up to 1.7 x 1.2 cm, previously 1.2 x 1.0 cm (series 2, image 21). Small hiatal hernia. Thyroid gland, trachea, and esophagus demonstrate no significant findings. Lungs/Pleura: Unchanged volume loss of the left lung with a small, loculated pleural effusion, pleural thickening, and areas of rounded atelectasis of the dependent left lung (series 6, image 96). Small pulmonary nodules of the right upper lobe measuring no greater than 3 mm are unchanged (series 6, image 44, 41). Musculoskeletal: No chest wall mass or  suspicious bone lesions identified. CT ABDOMEN PELVIS FINDINGS Hepatobiliary: No solid liver abnormality is seen. No gallstones, gallbladder wall thickening, or biliary dilatation. Pancreas: Unremarkable. No pancreatic ductal dilatation or surrounding inflammatory changes. Spleen: Normal in size without significant abnormality. Adrenals/Urinary Tract: Adrenal glands are unremarkable. Status post left nephrectomy. The right kidney is normal, without renal calculi, solid lesion, or hydronephrosis. Bladder is unremarkable. Stomach/Bowel: Stomach is within normal limits. Appendix appears normal. No evidence of bowel wall thickening, distention, or inflammatory changes. Vascular/Lymphatic: Aortic atherosclerosis. No enlarged abdominal or pelvic lymph nodes. Reproductive: No mass or other abnormality. Other: No abdominal wall hernia or abnormality. No abdominopelvic ascites. Musculoskeletal: No acute or significant osseous findings. IMPRESSION: 1. Continued interval enlargement of right axillary lymph nodes, measuring up to 1.7 x 1.2 cm, concerning for nodal metastatic disease. 2. Unchanged retrosternal/epicardial lymph node or soft tissue nodule measuring 1.2 x 1.0 cm. Additional unchanged subcentimeter epicardial lymph node or soft tissue nodule overlying the pulmonary outflow tract measuring 0.7 x 0.5 cm. 3. Unchanged volume loss of the left lung with a small, loculated pleural effusion, pleural thickening, and areas of rounded atelectasis of the dependent left lung. Primary lung malignancy is not discretely appreciated at present. 4. Unchanged small pulmonary nodules of the right upper lobe measuring no greater than 3 mm, almost certainly benign and incidental. Attention on follow-up. 5. No evidence of metastatic disease in the abdomen or pelvis. 6. Status post left nephrectomy. 7. Coronary artery disease. Aortic Atherosclerosis (ICD10-I70.0). Electronically Signed   By: ADelanna AhmadiM.D.   On: 12/16/2020 19:22      ASSESSMENT AND PLAN: This is a very pleasant 65years old white female recently diagnosed with a  stage IV (T3, N0, M1 a) non-small cell lung cancer, adenocarcinoma presented with large left lower lobe lung mass in addition to another lingular mass and malignant left pleural effusion diagnosed in September 2021.  The molecular studies showed positive PD-L1 expression of 95%.  The patient also has K-ras G12C mutation which will be an option for the second line treatment. With the high PD-L1 expression I discussed with the patient treatment with single agent Libtayo (Cempilimab) 350 mg IV every 3 weeks for up to 2 years unless the patient has evidence for disease progression or unacceptable toxicity. The patient started the first cycle of this treatment on January 15, 2020. She is status post 15 cycles.  The patient has been tolerating her treatment well with no concerning adverse effect except for few episodes of diarrhea improved with Imodium. She had repeat CT scan of the chest, abdomen pelvis performed recently.  I personally and independently reviewed the scan and discussed the results with the patient and her husband. Her scan showed stable disease except for slightly enlarged right axillary lymph nodes that need close monitoring on upcoming imaging studies to rule out any disease progression. I recommended for her to continue her current treatment with immunotherapy for now.  If the patient has evidence for disease progression in the future she may benefit from treatment with palliative radiotherapy if it is a solitary lesion or switching her treatment to [place] (Sotorasib) because of the positive KRAS G12C mutation. The patient is in agreement with the current plan. She will come back for follow-up visit in 3 weeks for evaluation before the next cycle of her treatment. She was advised to call immediately if she has any concerning symptoms in the interval. The patient voices understanding of  current disease status and treatment options and is in agreement with the current care plan.  All questions were answered. The patient knows to call the clinic with any problems, questions or concerns. We can certainly see the patient much sooner if necessary.   Disclaimer: This note was dictated with voice recognition software. Similar sounding words can inadvertently be transcribed and may not be corrected upon review.

## 2020-12-31 NOTE — Progress Notes (Signed)
[name] OFFICE PROGRESS NOTE  [name], NP [address] [place]  [id number]  DIAGNOSIS: Stage IV (T3, N0, M1 a) non-small cell lung cancer, adenocarcinoma presented with large left lower lobe lung mass in addition to another lingual mass and malignant left pleural effusion diagnosed in September 2021   Biomarker Findings Tumor Mutational Burden - 13 Muts/Mb Microsatellite status - MS-Stable Genomic Findings For a complete list of the genes assayed, please refer to the Appendix. KRAS G12C PRDM1 D245f*46 TERT promoter -146C>T TP53 V157F 7 Disease relevant genes with no reportable alterations: ALK, BRAF, EGFR, ERBB2, MET, RET, ROS1   PDL1 Expression: 95%    PRIOR THERAPY: None  CURRENT THERAPY: Libtayo (Cempilimab) 350 mg IV every 3 weeks.  First dose January 15, 2020.  Status post 17 cycles  [name] 65y.o. female returns to the clinic for a follow up visit accompanied by her husband. The patient is feeling well today without any concerning complaints. The patient continues to tolerate treatment with immunotherapy with Libtayo well without any adverse side effects except she does have some scattered skin lesions which are consistent with immunotherapy mediated although these are getting better recently. These do not bother her. She also takes benadryl if needed. She also has intermittent diarrhea, which is worse in the morning, but does not like taking medication so she has not been taking imodium. She does not imodium helps when she takes it. On days she has diarrhea, she can have anywhere from 1-3 loose stools. She denies blood in the stool or abdominal pain. She notes her diarrhea is worse after a greasy meal.   Denies any fever, chills, or night sweats. She lost weight recently. She is not drinking supplemental drinks. She has diabetes. Denies any chest pain, cough, or hemoptysis. She denies any significant shortness of  breath. She has had a stable pleural effusion which she is not very symptomatic from. Denies any nausea, vomiting, or constipation. She can feel the right axillary adenopathy that was enlarging on her last CT scan. We are monitoring it for now per last office note.  Denies any headache or visual changes. The patient is here today for evaluation prior to starting cycle # 18.  MEDICAL HISTORY: Past Medical History:  Diagnosis Date   Hyperlipidemia    Hypertension    Pleural effusion    Pneumonia    Stage IV adenocarcinoma of lung, left (HCC)     ALLERGIES:  is allergic to ace inhibitors.  MEDICATIONS:  Current Outpatient Medications  Medication Sig Dispense Refill   cholecalciferol (VITAMIN D3) 25 MCG (1000 UNIT) tablet Take 2,000 Units by mouth daily.     levothyroxine (SYNTHROID) 50 MCG tablet TAKE 1 TABLET BY MOUTH EVERY DAY BEFORE BREAKFAST 90 tablet 0   losartan-hydrochlorothiazide (HYZAAR) 100-12.5 MG tablet Take 1 tablet by mouth daily.     metFORMIN (GLUCOPHAGE) 500 MG tablet Take 500 mg by mouth 2 (two) times daily with a meal.     pravastatin (PRAVACHOL) 40 MG tablet Take 40 mg by mouth daily.     prochlorperazine (COMPAZINE) 10 MG tablet Take 1 tablet (10 mg total) by mouth every 6 (six) hours as needed for nausea or vomiting. 30 tablet 0   sertraline (ZOLOFT) 50 MG tablet Take 75 mg by mouth daily.     No current facility-administered medications for this visit.    SURGICAL HISTORY:  Past Surgical History:  Procedure Laterality Date  NEPHRECTOMY Left     REVIEW OF SYSTEMS:   Constitutional: Positive for weight loss. Negative for appetite change, chills, fatigue, and fever. HENT: Negative for mouth sores, nosebleeds, sore throat and trouble swallowing.   Eyes: Negative for eye problems and icterus.  Respiratory: Negative for cough, hemoptysis, shortness of breath and wheezing.   Cardiovascular: Negative for chest pain and leg swelling.  Gastrointestinal: Positive for  intermittent diarrhea. Negative for abdominal pain, constipation, nausea and vomiting.  Genitourinary: Negative for bladder incontinence, difficulty urinating, dysuria, frequency and hematuria.   Musculoskeletal: Negative for back pain, gait problem, neck pain and neck stiffness.  Skin: Positive for intermittent skin lesions (improved from prior).  Neurological: Negative for dizziness, extremity weakness, gait problem, headaches, light-headedness and seizures.  Hematological: Negative for adenopathy. Does not bruise/bleed easily.  Psychiatric/Behavioral: Negative for confusion, depression and sleep disturbance. The patient is not nervous/anxious.   PHYSICAL EXAMINATION:  Blood pressure 126/67, pulse 61, temperature 98.9 F (37.2 C), temperature source Oral, resp. rate 16, height $RemoveBe'5\' 4"'vTnzdbwLS$  (1.626 m), weight 142 lb 14.4 oz (64.8 kg), SpO2 100 %.  ECOG PERFORMANCE STATUS: 1  Physical Exam  CConstitutional: Oriented to person, place, and time and well-developed, well-nourished, and in no distress.   HENT: Head: Normocephalic and atraumatic. Mouth/Throat: Oropharynx is clear and moist. No oropharyngeal exudate. Eyes: Conjunctivae are normal. Right eye exhibits no discharge. Left eye exhibits no discharge. No scleral icterus. Neck: Normal range of motion. Neck supple. Cardiovascular: Normal rate, regular rhythm, normal heart sounds and intact distal pulses.   Pulmonary/Chest: Effort normal. Decreased breath sounds in base of left lung. No respiratory distress. No wheezes. No rales. Abdominal: Soft. Bowel sounds are normal. Exhibits no distension and no mass. There is no tenderness.  Musculoskeletal: Normal range of motion. Exhibits no edema.  Lymphadenopathy:    No cervical adenopathy.  Neurological: Alert and oriented to person, place, and time. Exhibits normal muscle tone. Gait normal. Coordination normal. Skin: Some dry small scattered skin lesions on chest and extremities (improved from  prior). Skin is warm and dry. Not diaphoretic. No erythema. No pallor.  Psychiatric: Mood, memory and judgment normal. Vitals reviewed.    LABORATORY DATA: Lab Results  Component Value Date   WBC 4.2 01/07/2021   HGB 11.5 (L) 01/07/2021   HCT 35.2 (L) 01/07/2021   MCV 87.3 01/07/2021   PLT 147 (L) 01/07/2021      Chemistry      Component Value Date/Time   NA 141 12/17/2020 1023   K 3.6 12/17/2020 1023   CL 107 12/17/2020 1023   CO2 23 12/17/2020 1023   BUN 11 12/17/2020 1023   CREATININE 0.96 12/17/2020 1023      Component Value Date/Time   CALCIUM 9.3 12/17/2020 1023   ALKPHOS 63 12/17/2020 1023   AST 18 12/17/2020 1023   ALT 13 12/17/2020 1023   BILITOT 0.3 12/17/2020 1023       RADIOGRAPHIC STUDIES:  CT Chest W Contrast  Result Date: 12/16/2020 CLINICAL DATA:  Metastatic lung cancer restaging, ongoing immunotherapy EXAM: CT CHEST, ABDOMEN, AND PELVIS WITH CONTRAST TECHNIQUE: Multidetector CT imaging of the chest, abdomen and pelvis was performed following the standard protocol during bolus administration of intravenous contrast. CONTRAST:  73mL OMNIPAQUE IOHEXOL 350 MG/ML SOLN, additional oral enteric contrast COMPARISON:  09/19/2020 FINDINGS: CT CHEST FINDINGS Cardiovascular: Aortic atherosclerosis. Normal heart size. Three-vessel coronary artery calcifications. No pericardial effusion. Mediastinum/Nodes: Unchanged retrosternal/epicardial lymph node or soft tissue nodule measuring 1.2 x 1.0 cm (series 2,  image 31). Additional unchanged subcentimeter epicardial lymph node or soft tissue nodule overlying the pulmonary outflow tract measuring 0.7 x 0.5 cm (series 2, image 30). Continued interval enlargement of right axillary lymph nodes, measuring up to 1.7 x 1.2 cm, previously 1.2 x 1.0 cm (series 2, image 21). Small hiatal hernia. Thyroid gland, trachea, and esophagus demonstrate no significant findings. Lungs/Pleura: Unchanged volume loss of the left lung with a small,  loculated pleural effusion, pleural thickening, and areas of rounded atelectasis of the dependent left lung (series 6, image 96). Small pulmonary nodules of the right upper lobe measuring no greater than 3 mm are unchanged (series 6, image 44, 41). Musculoskeletal: No chest wall mass or suspicious bone lesions identified. CT ABDOMEN PELVIS FINDINGS Hepatobiliary: No solid liver abnormality is seen. No gallstones, gallbladder wall thickening, or biliary dilatation. Pancreas: Unremarkable. No pancreatic ductal dilatation or surrounding inflammatory changes. Spleen: Normal in size without significant abnormality. Adrenals/Urinary Tract: Adrenal glands are unremarkable. Status post left nephrectomy. The right kidney is normal, without renal calculi, solid lesion, or hydronephrosis. Bladder is unremarkable. Stomach/Bowel: Stomach is within normal limits. Appendix appears normal. No evidence of bowel wall thickening, distention, or inflammatory changes. Vascular/Lymphatic: Aortic atherosclerosis. No enlarged abdominal or pelvic lymph nodes. Reproductive: No mass or other abnormality. Other: No abdominal wall hernia or abnormality. No abdominopelvic ascites. Musculoskeletal: No acute or significant osseous findings. IMPRESSION: 1. Continued interval enlargement of right axillary lymph nodes, measuring up to 1.7 x 1.2 cm, concerning for nodal metastatic disease. 2. Unchanged retrosternal/epicardial lymph node or soft tissue nodule measuring 1.2 x 1.0 cm. Additional unchanged subcentimeter epicardial lymph node or soft tissue nodule overlying the pulmonary outflow tract measuring 0.7 x 0.5 cm. 3. Unchanged volume loss of the left lung with a small, loculated pleural effusion, pleural thickening, and areas of rounded atelectasis of the dependent left lung. Primary lung malignancy is not discretely appreciated at present. 4. Unchanged small pulmonary nodules of the right upper lobe measuring no greater than 3 mm, almost  certainly benign and incidental. Attention on follow-up. 5. No evidence of metastatic disease in the abdomen or pelvis. 6. Status post left nephrectomy. 7. Coronary artery disease. Aortic Atherosclerosis (ICD10-I70.0). Electronically Signed   By: Delanna Ahmadi M.D.   On: 12/16/2020 19:22   CT Abdomen Pelvis W Contrast  Result Date: 12/16/2020 CLINICAL DATA:  Metastatic lung cancer restaging, ongoing immunotherapy EXAM: CT CHEST, ABDOMEN, AND PELVIS WITH CONTRAST TECHNIQUE: Multidetector CT imaging of the chest, abdomen and pelvis was performed following the standard protocol during bolus administration of intravenous contrast. CONTRAST:  47m OMNIPAQUE IOHEXOL 350 MG/ML SOLN, additional oral enteric contrast COMPARISON:  09/19/2020 FINDINGS: CT CHEST FINDINGS Cardiovascular: Aortic atherosclerosis. Normal heart size. Three-vessel coronary artery calcifications. No pericardial effusion. Mediastinum/Nodes: Unchanged retrosternal/epicardial lymph node or soft tissue nodule measuring 1.2 x 1.0 cm (series 2, image 31). Additional unchanged subcentimeter epicardial lymph node or soft tissue nodule overlying the pulmonary outflow tract measuring 0.7 x 0.5 cm (series 2, image 30). Continued interval enlargement of right axillary lymph nodes, measuring up to 1.7 x 1.2 cm, previously 1.2 x 1.0 cm (series 2, image 21). Small hiatal hernia. Thyroid gland, trachea, and esophagus demonstrate no significant findings. Lungs/Pleura: Unchanged volume loss of the left lung with a small, loculated pleural effusion, pleural thickening, and areas of rounded atelectasis of the dependent left lung (series 6, image 96). Small pulmonary nodules of the right upper lobe measuring no greater than 3 mm are unchanged (series 6, image 44,  41). Musculoskeletal: No chest wall mass or suspicious bone lesions identified. CT ABDOMEN PELVIS FINDINGS Hepatobiliary: No solid liver abnormality is seen. No gallstones, gallbladder wall thickening, or  biliary dilatation. Pancreas: Unremarkable. No pancreatic ductal dilatation or surrounding inflammatory changes. Spleen: Normal in size without significant abnormality. Adrenals/Urinary Tract: Adrenal glands are unremarkable. Status post left nephrectomy. The right kidney is normal, without renal calculi, solid lesion, or hydronephrosis. Bladder is unremarkable. Stomach/Bowel: Stomach is within normal limits. Appendix appears normal. No evidence of bowel wall thickening, distention, or inflammatory changes. Vascular/Lymphatic: Aortic atherosclerosis. No enlarged abdominal or pelvic lymph nodes. Reproductive: No mass or other abnormality. Other: No abdominal wall hernia or abnormality. No abdominopelvic ascites. Musculoskeletal: No acute or significant osseous findings. IMPRESSION: 1. Continued interval enlargement of right axillary lymph nodes, measuring up to 1.7 x 1.2 cm, concerning for nodal metastatic disease. 2. Unchanged retrosternal/epicardial lymph node or soft tissue nodule measuring 1.2 x 1.0 cm. Additional unchanged subcentimeter epicardial lymph node or soft tissue nodule overlying the pulmonary outflow tract measuring 0.7 x 0.5 cm. 3. Unchanged volume loss of the left lung with a small, loculated pleural effusion, pleural thickening, and areas of rounded atelectasis of the dependent left lung. Primary lung malignancy is not discretely appreciated at present. 4. Unchanged small pulmonary nodules of the right upper lobe measuring no greater than 3 mm, almost certainly benign and incidental. Attention on follow-up. 5. No evidence of metastatic disease in the abdomen or pelvis. 6. Status post left nephrectomy. 7. Coronary artery disease. Aortic Atherosclerosis (ICD10-I70.0). Electronically Signed   By: Delanna Ahmadi M.D.   On: 12/16/2020 19:22     ASSESSMENT/PLAN:  This is a very pleasant 65 year old Caucasian female diagnosed with stage IV (T3, N0, M1A) non-small cell lung cancer, adenocarcinoma.  She  presented with a large left lower lobe lung mass in addition to another lingular mass and a malignant left pleural effusion.  She was diagnosed in September 2021.  Her molecular studies show that she has a PD-L1 expression at 95%.  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   The patient is currently undergoing treatment with single agent Libtayo (Cempilimab) 350 mg IV every 3 weeks for up to 2 years unless the patient has evidence for disease progression or unacceptable toxicity.  She is status post 17 cycles and tolerated it well without any concerning adverse side effects.     Labs were reviewed.  Recommend that she proceed with cycle #18 today scheduled.  We will see her back for follow-up visit in 3 weeks for evaluation before starting cycle #19.   She is reluctant to take medications, however, she was instructed to use Imodium if needed for diarrhea.  If she continues to have significant diarrhea despite taking Imodium she was advised to call us for further steps in management. She was inquiring if she can take probiotics. Discussed that should be fine if she wanted to try that in addition to imodium. Denies fevers, abdominal pain, or blood in the stool.   For the weight loss, I discussed we have nutritionist on site if she is interested in a referral. She will think about it. In the meantime, advised to drink supplemental drinks. She does have diabetes. Therefore, discussed she would need to make sure she gets a diabetic type of supplemental drink.   She will continue to use hydrocortisone cream if needed for her rash and Benadryl if needed.  The  patient was advised to call immediately if she has any concerning symptoms in the interval. The patient voices understanding of current disease status and treatment options and is in agreement with the current care plan. All questions were answered. The patient knows to call the clinic with any  problems, questions or concerns. We can certainly see the patient much sooner if necessary       No orders of the defined types were placed in this encounter.    The total time spent in the appointment was 20-29 minutes  Mersades Barbaro L Lawrie Tunks, PA-C 01/07/21

## 2021-01-07 ENCOUNTER — Inpatient Hospital Stay (HOSPITAL_BASED_OUTPATIENT_CLINIC_OR_DEPARTMENT_OTHER): Payer: Medicare Other | Admitting: Physician Assistant

## 2021-01-07 ENCOUNTER — Other Ambulatory Visit: Payer: Self-pay

## 2021-01-07 ENCOUNTER — Inpatient Hospital Stay: Payer: Medicare Other

## 2021-01-07 ENCOUNTER — Inpatient Hospital Stay: Payer: Medicare Other | Attending: Internal Medicine

## 2021-01-07 VITALS — BP 126/67 | HR 61 | Temp 98.9°F | Resp 16 | Ht 64.0 in | Wt 142.9 lb

## 2021-01-07 DIAGNOSIS — Z79899 Other long term (current) drug therapy: Secondary | ICD-10-CM | POA: Diagnosis not present

## 2021-01-07 DIAGNOSIS — I10 Essential (primary) hypertension: Secondary | ICD-10-CM | POA: Insufficient documentation

## 2021-01-07 DIAGNOSIS — Z7984 Long term (current) use of oral hypoglycemic drugs: Secondary | ICD-10-CM | POA: Insufficient documentation

## 2021-01-07 DIAGNOSIS — R5382 Chronic fatigue, unspecified: Secondary | ICD-10-CM

## 2021-01-07 DIAGNOSIS — C3432 Malignant neoplasm of lower lobe, left bronchus or lung: Secondary | ICD-10-CM | POA: Diagnosis present

## 2021-01-07 DIAGNOSIS — Z5112 Encounter for antineoplastic immunotherapy: Secondary | ICD-10-CM | POA: Insufficient documentation

## 2021-01-07 DIAGNOSIS — Z905 Acquired absence of kidney: Secondary | ICD-10-CM | POA: Diagnosis not present

## 2021-01-07 DIAGNOSIS — C3492 Malignant neoplasm of unspecified part of left bronchus or lung: Secondary | ICD-10-CM | POA: Diagnosis not present

## 2021-01-07 LAB — CBC WITH DIFFERENTIAL (CANCER CENTER ONLY)
Abs Immature Granulocytes: 0.02 10*3/uL (ref 0.00–0.07)
Basophils Absolute: 0.1 10*3/uL (ref 0.0–0.1)
Basophils Relative: 1 %
Eosinophils Absolute: 0.4 10*3/uL (ref 0.0–0.5)
Eosinophils Relative: 9 %
HCT: 35.2 % — ABNORMAL LOW (ref 36.0–46.0)
Hemoglobin: 11.5 g/dL — ABNORMAL LOW (ref 12.0–15.0)
Immature Granulocytes: 1 %
Lymphocytes Relative: 16 %
Lymphs Abs: 0.7 10*3/uL (ref 0.7–4.0)
MCH: 28.5 pg (ref 26.0–34.0)
MCHC: 32.7 g/dL (ref 30.0–36.0)
MCV: 87.3 fL (ref 80.0–100.0)
Monocytes Absolute: 0.4 10*3/uL (ref 0.1–1.0)
Monocytes Relative: 9 %
Neutro Abs: 2.7 10*3/uL (ref 1.7–7.7)
Neutrophils Relative %: 64 %
Platelet Count: 147 10*3/uL — ABNORMAL LOW (ref 150–400)
RBC: 4.03 MIL/uL (ref 3.87–5.11)
RDW: 15.2 % (ref 11.5–15.5)
WBC Count: 4.2 10*3/uL (ref 4.0–10.5)
nRBC: 0 % (ref 0.0–0.2)

## 2021-01-07 LAB — CMP (CANCER CENTER ONLY)
ALT: 20 U/L (ref 0–44)
AST: 20 U/L (ref 15–41)
Albumin: 3.5 g/dL (ref 3.5–5.0)
Alkaline Phosphatase: 62 U/L (ref 38–126)
Anion gap: 7 (ref 5–15)
BUN: 13 mg/dL (ref 8–23)
CO2: 20 mmol/L — ABNORMAL LOW (ref 22–32)
Calcium: 8.9 mg/dL (ref 8.9–10.3)
Chloride: 112 mmol/L — ABNORMAL HIGH (ref 98–111)
Creatinine: 0.91 mg/dL (ref 0.44–1.00)
GFR, Estimated: >60 mL/min (ref >60)
Glucose, Bld: 142 mg/dL — ABNORMAL HIGH (ref 70–99)
Potassium: 3.6 mmol/L (ref 3.5–5.1)
Sodium: 139 mmol/L (ref 135–145)
Total Bilirubin: 0.4 mg/dL (ref 0.3–1.2)
Total Protein: 6.6 g/dL (ref 6.5–8.1)

## 2021-01-07 LAB — TSH: TSH: 4.698 u[IU]/mL — ABNORMAL HIGH (ref 0.308–3.960)

## 2021-01-07 MED ORDER — SODIUM CHLORIDE 0.9 % IV SOLN
350.0000 mg | Freq: Once | INTRAVENOUS | Status: AC
  Administered 2021-01-07: 350 mg via INTRAVENOUS
  Filled 2021-01-07: qty 7

## 2021-01-07 MED ORDER — SODIUM CHLORIDE 0.9 % IV SOLN: Freq: Once | INTRAVENOUS | Status: AC

## 2021-01-07 NOTE — Patient Instructions (Signed)
[facility]  Discharge Instructions: Thank you for choosing [facility] to provide your oncology and hematology care.   If you have a lab appointment with the [facility], please go directly to the [facility] and check in at the registration area.   Wear comfortable clothing and clothing appropriate for easy access to any Portacath or PICC line.   We strive to give you quality time with your provider. You may need to reschedule your appointment if you arrive late (15 or more minutes).  Arriving late affects you and other patients whose appointments are after yours.  Also, if you miss three or more appointments without notifying the office, you may be dismissed from the clinic at the provider's discretion.      For prescription refill requests, have your pharmacy contact our office and allow 72 hours for refills to be completed.    Today you received the following chemotherapy and/or immunotherapy agents: Libtayo   To help prevent nausea and vomiting after your treatment, we encourage you to take your nausea medication as directed.  BELOW ARE SYMPTOMS THAT SHOULD BE REPORTED IMMEDIATELY: *FEVER GREATER THAN 100.4 F (38 C) OR HIGHER *CHILLS OR SWEATING *NAUSEA AND VOMITING THAT IS NOT CONTROLLED WITH YOUR NAUSEA MEDICATION *UNUSUAL SHORTNESS OF BREATH *UNUSUAL BRUISING OR BLEEDING *URINARY PROBLEMS (pain or burning when urinating, or frequent urination) *BOWEL PROBLEMS (unusual diarrhea, constipation, pain near the anus) TENDERNESS IN MOUTH AND THROAT WITH OR WITHOUT PRESENCE OF ULCERS (sore throat, sores in mouth, or a toothache) UNUSUAL RASH, SWELLING OR PAIN  UNUSUAL VAGINAL DISCHARGE OR ITCHING   Items with * indicate a potential emergency and should be followed up as soon as possible or go to the Emergency Department if any problems should occur.  Please show the CHEMOTHERAPY ALERT CARD or IMMUNOTHERAPY ALERT CARD at check-in to the  Emergency Department and triage nurse.  Should you have questions after your visit or need to cancel or reschedule your appointment, please contact [facility]  Dept: [phone]  and follow the prompts.  Office hours are 8:00 a.m. to 4:30 p.m. Monday - Friday. Please note that voicemails left after 4:00 p.m. may not be returned until the following business day.  We are closed weekends and major holidays. You have access to a nurse at all times for urgent questions. Please call the main number to the clinic Dept: [phone] and follow the prompts.   For any non-urgent questions, you may also contact your provider using MyChart. We now offer e-Visits for anyone 90 and older to request care online for non-urgent symptoms. For details visit [email or web address].   Also download the MyChart app! Go to the app store, search "MyChart", open the app, select [facility], and log in with your MyChart username and password.  Due to Covid, a mask is required upon entering the hospital/clinic. If you do not have a mask, one will be given to you upon arrival. For doctor visits, patients may have 1 support person aged 6 or older with them. For treatment visits, patients cannot have anyone with them due to current Covid guidelines and our immunocompromised population.

## 2021-01-27 ENCOUNTER — Other Ambulatory Visit: Payer: Self-pay

## 2021-01-27 DIAGNOSIS — C3492 Malignant neoplasm of unspecified part of left bronchus or lung: Secondary | ICD-10-CM

## 2021-01-27 DIAGNOSIS — R5382 Chronic fatigue, unspecified: Secondary | ICD-10-CM

## 2021-01-28 ENCOUNTER — Inpatient Hospital Stay: Payer: Medicare Other

## 2021-01-28 ENCOUNTER — Other Ambulatory Visit: Payer: Self-pay

## 2021-01-28 ENCOUNTER — Inpatient Hospital Stay (HOSPITAL_BASED_OUTPATIENT_CLINIC_OR_DEPARTMENT_OTHER): Payer: Medicare Other | Admitting: Internal Medicine

## 2021-01-28 ENCOUNTER — Encounter: Payer: Self-pay | Admitting: Internal Medicine

## 2021-01-28 VITALS — BP 124/72 | HR 63 | Temp 97.5°F | Resp 18 | Ht 64.0 in | Wt 144.2 lb

## 2021-01-28 DIAGNOSIS — C3492 Malignant neoplasm of unspecified part of left bronchus or lung: Secondary | ICD-10-CM

## 2021-01-28 DIAGNOSIS — R5382 Chronic fatigue, unspecified: Secondary | ICD-10-CM

## 2021-01-28 DIAGNOSIS — Z5112 Encounter for antineoplastic immunotherapy: Secondary | ICD-10-CM

## 2021-01-28 DIAGNOSIS — C3490 Malignant neoplasm of unspecified part of unspecified bronchus or lung: Secondary | ICD-10-CM

## 2021-01-28 LAB — CBC WITH DIFFERENTIAL (CANCER CENTER ONLY)
Abs Immature Granulocytes: 0.01 10*3/uL (ref 0.00–0.07)
Basophils Absolute: 0.1 10*3/uL (ref 0.0–0.1)
Basophils Relative: 1 %
Eosinophils Absolute: 0.5 10*3/uL (ref 0.0–0.5)
Eosinophils Relative: 9 %
HCT: 37.2 % (ref 36.0–46.0)
Hemoglobin: 11.4 g/dL — ABNORMAL LOW (ref 12.0–15.0)
Immature Granulocytes: 0 %
Lymphocytes Relative: 19 %
Lymphs Abs: 0.9 10*3/uL (ref 0.7–4.0)
MCH: 28.1 pg (ref 26.0–34.0)
MCHC: 30.6 g/dL (ref 30.0–36.0)
MCV: 91.9 fL (ref 80.0–100.0)
Monocytes Absolute: 0.3 10*3/uL (ref 0.1–1.0)
Monocytes Relative: 6 %
Neutro Abs: 3.3 10*3/uL (ref 1.7–7.7)
Neutrophils Relative %: 65 %
Platelet Count: 219 10*3/uL (ref 150–400)
RBC: 4.05 MIL/uL (ref 3.87–5.11)
RDW: 14.6 % (ref 11.5–15.5)
WBC Count: 5.1 10*3/uL (ref 4.0–10.5)
nRBC: 0 % (ref 0.0–0.2)

## 2021-01-28 LAB — CMP (CANCER CENTER ONLY)
ALT: 22 U/L (ref 0–44)
AST: 20 U/L (ref 15–41)
Albumin: 3.6 g/dL (ref 3.5–5.0)
Alkaline Phosphatase: 64 U/L (ref 38–126)
Anion gap: 8 (ref 5–15)
BUN: 14 mg/dL (ref 8–23)
CO2: 19 mmol/L — ABNORMAL LOW (ref 22–32)
Calcium: 9.1 mg/dL (ref 8.9–10.3)
Chloride: 111 mmol/L (ref 98–111)
Creatinine: 0.86 mg/dL (ref 0.44–1.00)
GFR, Estimated: >60 mL/min (ref >60)
Glucose, Bld: 141 mg/dL — ABNORMAL HIGH (ref 70–99)
Potassium: 4.1 mmol/L (ref 3.5–5.1)
Sodium: 138 mmol/L (ref 135–145)
Total Bilirubin: <0.2 mg/dL — ABNORMAL LOW (ref 0.3–1.2)
Total Protein: 6.8 g/dL (ref 6.5–8.1)

## 2021-01-28 LAB — TSH: TSH: 4.035 u[IU]/mL — ABNORMAL HIGH (ref 0.308–3.960)

## 2021-01-28 MED ORDER — SODIUM CHLORIDE 0.9 % IV SOLN: Freq: Once | INTRAVENOUS | Status: AC

## 2021-01-28 MED ORDER — SODIUM CHLORIDE 0.9 % IV SOLN
350.0000 mg | Freq: Once | INTRAVENOUS | Status: AC
  Administered 2021-01-28: 350 mg via INTRAVENOUS
  Filled 2021-01-28: qty 7

## 2021-01-28 NOTE — Patient Instructions (Signed)
[facility]   Discharge Instructions: Thank you for choosing [facility] to provide your oncology and hematology care.   If you have a lab appointment with the [facility], please go directly to the [facility] and check in at the registration area.   Wear comfortable clothing and clothing appropriate for easy access to any Portacath or PICC line.   We strive to give you quality time with your provider. You may need to reschedule your appointment if you arrive late (15 or more minutes).  Arriving late affects you and other patients whose appointments are after yours.  Also, if you miss three or more appointments without notifying the office, you may be dismissed from the clinic at the provider's discretion.      For prescription refill requests, have your pharmacy contact our office and allow 72 hours for refills to be completed.    Today you received the following chemotherapy and/or immunotherapy agents: [id number]      To help prevent nausea and vomiting after your treatment, we encourage you to take your nausea medication as directed.  BELOW ARE SYMPTOMS THAT SHOULD BE REPORTED IMMEDIATELY: *FEVER GREATER THAN 100.4 F (38 C) OR HIGHER *CHILLS OR SWEATING *NAUSEA AND VOMITING THAT IS NOT CONTROLLED WITH YOUR NAUSEA MEDICATION *UNUSUAL SHORTNESS OF BREATH *UNUSUAL BRUISING OR BLEEDING *URINARY PROBLEMS (pain or burning when urinating, or frequent urination) *BOWEL PROBLEMS (unusual diarrhea, constipation, pain near the anus) TENDERNESS IN MOUTH AND THROAT WITH OR WITHOUT PRESENCE OF ULCERS (sore throat, sores in mouth, or a toothache) UNUSUAL RASH, SWELLING OR PAIN  UNUSUAL VAGINAL DISCHARGE OR ITCHING   Items with * indicate a potential emergency and should be followed up as soon as possible or go to the Emergency Department if any problems should occur.  Please show the CHEMOTHERAPY ALERT CARD or IMMUNOTHERAPY ALERT CARD at  check-in to the Emergency Department and triage nurse.  Should you have questions after your visit or need to cancel or reschedule your appointment, please contact [facility]  Dept: [phone]  and follow the prompts.  Office hours are 8:00 a.m. to 4:30 p.m. Monday - Friday. Please note that voicemails left after 4:00 p.m. may not be returned until the following business day.  We are closed weekends and major holidays. You have access to a nurse at all times for urgent questions. Please call the main number to the clinic Dept: [phone] and follow the prompts.   For any non-urgent questions, you may also contact your provider using MyChart. We now offer e-Visits for anyone 10 and older to request care online for non-urgent symptoms. For details visit [email or web address].   Also download the MyChart app! Go to the app store, search "MyChart", open the app, select [facility], and log in with your MyChart username and password.  Due to Covid, a mask is required upon entering the hospital/clinic. If you do not have a mask, one will be given to you upon arrival. For doctor visits, patients may have 1 support person aged 21 or older with them. For treatment visits, patients cannot have anyone with them due to current Covid guidelines and our immunocompromised population.

## 2021-01-28 NOTE — Progress Notes (Signed)
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 KRAS G12C PRDM1 D214f*46 TERT promoter -146C>T TP53 V157F 7 Disease relevant genes with no reportable alterations: ALK, BRAF, EGFR, ERBB2, MET, RET, ROS1   PDL1 Expression: 95%  PRIOR THERAPY: None.  CURRENT THERAPY: Libtayo (Cempilimab) 350 mg IV every 3 weeks.  First dose January 15, 2020.  Status post 18 cycles.  INTERVAL HISTORY: Gabrielle [id number]. Gabrielle Gabrielle [name] returns to [facility] clinic today for follow-up visit accompanied by Gabrielle Gabrielle Gabrielle [name].  Gabrielle Gabrielle Gabrielle [name] is feeling fine today with no concerning complaints except for Gabrielle swelling in Gabrielle right axilla that has not changed significantly in Gabrielle last several weeks.  She denied having any current chest pain, shortness of breath, cough or hemoptysis.  She denied having any fever or chills.  She has no nausea, vomiting, diarrhea or constipation.  She has no headache or visual changes.  She continues to tolerate Gabrielle treatment with Libtayo (Cempilimab) fairly well.  She is here today for evaluation before starting cycle #19.   MEDICAL HISTORY: Past Medical History:  Diagnosis Date   Hyperlipidemia    Hypertension    Pleural effusion    Pneumonia    Stage IV adenocarcinoma of lung, left (HCC)     ALLERGIES:  is allergic to ace inhibitors.  MEDICATIONS:  Current Outpatient Medications  Medication Sig Dispense Refill   cholecalciferol (VITAMIN D3) 25 MCG (1000 UNIT) tablet  Take 2,000 Units by mouth daily.     levothyroxine (SYNTHROID) 50 MCG tablet TAKE 1 TABLET BY MOUTH EVERY DAY BEFORE BREAKFAST 90 tablet 0   metFORMIN (GLUCOPHAGE) 500 MG tablet Take 500 mg by mouth 2 (two) times daily with a meal.     pravastatin (PRAVACHOL) 40 MG tablet Take 40 mg by mouth daily.     sertraline (ZOLOFT) 50 MG tablet Take 75 mg by mouth daily.     losartan-hydrochlorothiazide (HYZAAR) 100-12.5 MG tablet Take 1 tablet by mouth daily. (Gabrielle Gabrielle [name] not taking: Reported on 01/28/2021)     prochlorperazine (COMPAZINE) 10 MG tablet Take 1 tablet (10 mg total) by mouth every 6 (six) hours as needed for nausea or vomiting. (Gabrielle Gabrielle [name] not taking: Reported on 01/28/2021) 30 tablet 0   No current facility-administered medications for this visit.    SURGICAL HISTORY:  Past Surgical History:  Procedure Laterality Date   NEPHRECTOMY Left     REVIEW OF SYSTEMS:  A comprehensive review of systems was negative except for: Constitutional: positive for fatigue   PHYSICAL EXAMINATION: General appearance: alert, cooperative, fatigued, and no distress Head: Normocephalic, without obvious abnormality, atraumatic Neck: no adenopathy, no JVD, supple, symmetrical, trachea midline, and thyroid not enlarged, symmetric, no tenderness/mass/nodules Lymph nodes: Cervical, supraclavicular, and axillary nodes normal. Resp: clear to auscultation bilaterally Back: symmetric, no curvature. ROM normal. No CVA tenderness. Cardio: regular rate and rhythm, S1, S2 normal, no murmur, click, rub or gallop GI: soft, non-tender; bowel sounds normal; no masses,  no organomegaly Extremities: extremities normal, atraumatic, no cyanosis or  edema  ECOG PERFORMANCE STATUS: 1 - Symptomatic but completely ambulatory  Blood pressure 124/72, pulse 63, temperature (!) 97.5 F (36.4 C), temperature source Tympanic, resp. rate 18, height '5\' 4"'  (1.626 m), weight 144 lb 3.2 oz (65.4 kg), SpO2 99 %.  LABORATORY DATA: Lab Results   Component Value Date   WBC 5.1 01/28/2021   HGB 11.4 (L) 01/28/2021   HCT 37.2 01/28/2021   MCV 91.9 01/28/2021   PLT 219 01/28/2021      Chemistry      Component Value Date/Time   NA 139 01/07/2021 0834   K 3.6 01/07/2021 0834   CL 112 (H) 01/07/2021 0834   CO2 20 (L) 01/07/2021 0834   BUN 13 01/07/2021 0834   CREATININE 0.91 01/07/2021 0834      Component Value Date/Time   CALCIUM 8.9 01/07/2021 0834   ALKPHOS 62 01/07/2021 0834   AST 20 01/07/2021 0834   ALT 20 01/07/2021 0834   BILITOT 0.4 01/07/2021 0834       RADIOGRAPHIC STUDIES: No results found.   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  Gabrielle molecular studies showed positive PD-L1 expression of 95%.  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 With Gabrielle high PD-L1 expression I discussed with Gabrielle Gabrielle Gabrielle [name] treatment with single agent Libtayo (Cempilimab) 350 mg IV every 3 weeks for up to 2 years unless Gabrielle Gabrielle Gabrielle [name] has evidence for disease progression or unacceptable toxicity. Gabrielle Gabrielle Gabrielle [name] started Gabrielle first cycle of this treatment on January 15, 2020. She is status post 18 cycles.  Gabrielle Gabrielle Gabrielle [name] continues to tolerate this treatment well with no concerning adverse effects. I recommended for Gabrielle to proceed with cycle #19 today as planned. I will see Gabrielle back for follow-up visit in 3 weeks for evaluation with repeat CT scan of Gabrielle chest, abdomen and pelvis for restaging of Gabrielle disease. Gabrielle Gabrielle Gabrielle [name] was advised to call immediately if she has any other concerning symptoms in Gabrielle interval. Gabrielle Gabrielle Gabrielle [name] voices understanding of current disease status and treatment options and is in agreement with Gabrielle current care  plan.  All questions were answered. Gabrielle Gabrielle Gabrielle [name] knows to call [facility] clinic with any problems, questions or concerns. We can certainly see Gabrielle Gabrielle Gabrielle [name] much sooner if necessary.   Disclaimer: This note was dictated with voice recognition software. Similar sounding words can inadvertently be transcribed and may not be corrected upon review.

## 2021-01-29 ENCOUNTER — Other Ambulatory Visit: Payer: Self-pay | Admitting: Internal Medicine

## 2021-02-06 ENCOUNTER — Other Ambulatory Visit: Payer: Self-pay | Admitting: Physician Assistant

## 2021-02-06 ENCOUNTER — Inpatient Hospital Stay (HOSPITAL_BASED_OUTPATIENT_CLINIC_OR_DEPARTMENT_OTHER): Payer: Medicare Other | Admitting: Physician Assistant

## 2021-02-06 ENCOUNTER — Other Ambulatory Visit: Payer: Self-pay

## 2021-02-06 ENCOUNTER — Inpatient Hospital Stay: Payer: Medicare Other | Attending: Internal Medicine

## 2021-02-06 ENCOUNTER — Other Ambulatory Visit: Payer: Medicare Other

## 2021-02-06 ENCOUNTER — Encounter: Payer: Medicare Other | Admitting: Physician Assistant

## 2021-02-06 ENCOUNTER — Telehealth: Payer: Self-pay | Admitting: Medical Oncology

## 2021-02-06 ENCOUNTER — Inpatient Hospital Stay: Payer: Medicare Other

## 2021-02-06 VITALS — BP 130/63 | HR 65 | Temp 98.1°F | Resp 17 | Ht 64.0 in | Wt 135.6 lb

## 2021-02-06 DIAGNOSIS — C3492 Malignant neoplasm of unspecified part of left bronchus or lung: Secondary | ICD-10-CM | POA: Diagnosis not present

## 2021-02-06 DIAGNOSIS — R638 Other symptoms and signs concerning food and fluid intake: Secondary | ICD-10-CM

## 2021-02-06 DIAGNOSIS — C3432 Malignant neoplasm of lower lobe, left bronchus or lung: Secondary | ICD-10-CM | POA: Insufficient documentation

## 2021-02-06 DIAGNOSIS — E785 Hyperlipidemia, unspecified: Secondary | ICD-10-CM | POA: Insufficient documentation

## 2021-02-06 DIAGNOSIS — E860 Dehydration: Secondary | ICD-10-CM | POA: Insufficient documentation

## 2021-02-06 DIAGNOSIS — D72819 Decreased white blood cell count, unspecified: Secondary | ICD-10-CM | POA: Insufficient documentation

## 2021-02-06 DIAGNOSIS — Z905 Acquired absence of kidney: Secondary | ICD-10-CM | POA: Insufficient documentation

## 2021-02-06 DIAGNOSIS — I10 Essential (primary) hypertension: Secondary | ICD-10-CM | POA: Diagnosis not present

## 2021-02-06 DIAGNOSIS — Z87891 Personal history of nicotine dependence: Secondary | ICD-10-CM | POA: Insufficient documentation

## 2021-02-06 DIAGNOSIS — R197 Diarrhea, unspecified: Secondary | ICD-10-CM | POA: Diagnosis not present

## 2021-02-06 DIAGNOSIS — Z79899 Other long term (current) drug therapy: Secondary | ICD-10-CM | POA: Diagnosis not present

## 2021-02-06 DIAGNOSIS — Z7984 Long term (current) use of oral hypoglycemic drugs: Secondary | ICD-10-CM | POA: Insufficient documentation

## 2021-02-06 DIAGNOSIS — Z5112 Encounter for antineoplastic immunotherapy: Secondary | ICD-10-CM | POA: Insufficient documentation

## 2021-02-06 LAB — CMP (CANCER CENTER ONLY)
ALT: 11 U/L (ref 0–44)
AST: 14 U/L — ABNORMAL LOW (ref 15–41)
Albumin: 3.3 g/dL — ABNORMAL LOW (ref 3.5–5.0)
Alkaline Phosphatase: 76 U/L (ref 38–126)
Anion gap: 11 (ref 5–15)
BUN: 18 mg/dL (ref 8–23)
CO2: 24 mmol/L (ref 22–32)
Calcium: 9.3 mg/dL (ref 8.9–10.3)
Chloride: 104 mmol/L (ref 98–111)
Creatinine: 0.91 mg/dL (ref 0.44–1.00)
GFR, Estimated: >60 mL/min (ref >60)
Glucose, Bld: 133 mg/dL — ABNORMAL HIGH (ref 70–99)
Potassium: 4.3 mmol/L (ref 3.5–5.1)
Sodium: 139 mmol/L (ref 135–145)
Total Bilirubin: 0.2 mg/dL — ABNORMAL LOW (ref 0.3–1.2)
Total Protein: 6.8 g/dL (ref 6.5–8.1)

## 2021-02-06 LAB — CBC WITH DIFFERENTIAL (CANCER CENTER ONLY)
Abs Immature Granulocytes: 0.01 10*3/uL (ref 0.00–0.07)
Basophils Absolute: 0.1 10*3/uL (ref 0.0–0.1)
Basophils Relative: 2 %
Eosinophils Absolute: 0.3 10*3/uL (ref 0.0–0.5)
Eosinophils Relative: 7 %
HCT: 36.9 % (ref 36.0–46.0)
Hemoglobin: 11.9 g/dL — ABNORMAL LOW (ref 12.0–15.0)
Immature Granulocytes: 0 %
Lymphocytes Relative: 20 %
Lymphs Abs: 0.7 10*3/uL (ref 0.7–4.0)
MCH: 28.1 pg (ref 26.0–34.0)
MCHC: 32.2 g/dL (ref 30.0–36.0)
MCV: 87 fL (ref 80.0–100.0)
Monocytes Absolute: 0.6 10*3/uL (ref 0.1–1.0)
Monocytes Relative: 17 %
Neutro Abs: 2 10*3/uL (ref 1.7–7.7)
Neutrophils Relative %: 54 %
Platelet Count: 233 10*3/uL (ref 150–400)
RBC: 4.24 MIL/uL (ref 3.87–5.11)
RDW: 14.2 % (ref 11.5–15.5)
WBC Count: 3.7 10*3/uL — ABNORMAL LOW (ref 4.0–10.5)
nRBC: 0 % (ref 0.0–0.2)

## 2021-02-06 LAB — C DIFFICILE QUICK SCREEN W PCR REFLEX
C Diff antigen: POSITIVE — AB
C Diff toxin: NEGATIVE

## 2021-02-06 LAB — MAGNESIUM: Magnesium: 1.4 mg/dL — ABNORMAL LOW (ref 1.7–2.4)

## 2021-02-06 LAB — CLOSTRIDIUM DIFFICILE BY PCR, REFLEXED: Toxigenic C. Difficile by PCR: NEGATIVE

## 2021-02-06 MED ORDER — MAGNESIUM OXIDE -MG SUPPLEMENT 400 (240 MG) MG PO TABS: 400.0000 mg | ORAL_TABLET | Freq: Every day | ORAL | 0 refills | Status: AC

## 2021-02-06 MED ORDER — ONDANSETRON HCL 4 MG/2ML IJ SOLN
4.0000 mg | Freq: Once | INTRAMUSCULAR | Status: AC
  Administered 2021-02-06: 4 mg via INTRAVENOUS
  Filled 2021-02-06: qty 2

## 2021-02-06 MED ORDER — MAGNESIUM SULFATE 2 GM/50ML IV SOLN
2.0000 g | Freq: Once | INTRAVENOUS | Status: AC
  Administered 2021-02-06: 2 g via INTRAVENOUS
  Filled 2021-02-06: qty 50

## 2021-02-06 MED ORDER — SODIUM CHLORIDE 0.9 % IV SOLN: Freq: Once | INTRAVENOUS | Status: AC

## 2021-02-06 NOTE — Telephone Encounter (Signed)
"  Severe watery diarrhea since Saturday". On Libtayo and last dose 11/22.  Feels weak.  She had low grade fever Saturday and "sweated it out". No fever since.  Imodium is not helping. I do not think she is taking enough Imodium, but this is now day 6 of continued watery diarrhea.  Denies abdominal pain/n/v. She is trying to replace fluids that she lost with diarrhea Ate a chesee sandwich today.   Per Dr Julien Nordmann she needs to be seen in [facility].

## 2021-02-06 NOTE — Patient Instructions (Signed)
Continue Imodium to help with diarrhea. Take as directed on the bottle.   Eat a bland diet and drink plenty of water so you stay hydrated.  Prescription for magnesium pills sent to your pharmacy. Take as prescribed.   If you have more than 3 episodes of diarrhea per day while taking Imodium OR you develop abdominal pain OR fever you need to call [facility]  to be seen again.   If you have symptoms over the weekend please go to the Emergency department for evaluation.

## 2021-02-06 NOTE — Patient Instructions (Signed)
Magnesium Sulfate Injection What is this medication? MAGNESIUM SULFATE (mag NEE zee um SUL fate) prevents and treats low levels of magnesium in your body. It may also be used to prevent and treat seizures during pregnancy in people with high blood pressure disorders, such as preeclampsia or eclampsia. Magnesium plays an important role in maintaining the health of your muscles and nervous system. This medicine may be used for other purposes; ask your health care provider or pharmacist if you have questions. What should I tell my care team before I take this medication? They need to know if you have any of these conditions: Heart disease History of irregular heart beat Kidney disease An unusual or allergic reaction to magnesium sulfate, medications, foods, dyes, or preservatives Pregnant or trying to get pregnant Breast-feeding How should I use this medication? This medication is for infusion into a vein. It is given in a hospital or clinic setting. Talk to your care team about the use of this medication in children. While this medication may be prescribed for selected conditions, precautions do apply. Overdosage: If you think you have taken too much of this medicine contact a poison control center or emergency room at once. NOTE: This medicine is only for you. Do not share this medicine with others. What if I miss a dose? This does not apply. What may interact with this medication? Certain medications for anxiety or sleep Certain medications for seizures like phenobarbital Digoxin Medications that relax muscles for surgery Narcotic medications for pain This list may not describe all possible interactions. Give your health care provider a list of all the medicines, herbs, non-prescription drugs, or dietary supplements you use. Also tell them if you smoke, drink alcohol, or use illegal drugs. Some items may interact with your medicine. What should I watch for while using this medication? Your  condition will be monitored carefully while you are receiving this medication. You may need blood work done while you are receiving this medication. What side effects may I notice from receiving this medication? Side effects that you should report to your care team as soon as possible: Allergic reactions--skin rash, itching, hives, swelling of the face, lips, tongue, or throat High magnesium level--confusion, drowsiness, facial flushing, redness, sweating, muscle weakness, fast or irregular heartbeat, trouble breathing Low blood pressure--dizziness, feeling faint or lightheaded, blurry vision Side effects that usually do not require medical attention (report to your care team if they continue or are bothersome): Headache Nausea This list may not describe all possible side effects. Call your doctor for medical advice about side effects. You may report side effects to FDA at 1-800-FDA-1088. Where should I keep my medication? This medication is given in a hospital or clinic and will not be stored at home. NOTE: This sheet is a summary. It may not cover all possible information. If you have questions about this medicine, talk to your doctor, pharmacist, or health care provider.  2022 Elsevier/Gold Standard (2020-05-09 00:00:00)  Rehydration, Adult Rehydration is the replacement of body fluids, salts, and minerals (electrolytes) that are lost during dehydration. Dehydration is when there is not enough water or other fluids in the body. This happens when you lose more fluids than you take in. Common causes of dehydration include: Not drinking enough fluids. This can occur when you are ill or doing activities that require a lot of energy, especially in hot weather. Conditions that cause loss of water or other fluids, such as diarrhea, vomiting, sweating, or urinating a lot. Other illnesses, such  as fever or infection. Certain medicines, such as those that remove excess fluid from the body  (diuretics). Symptoms of mild or moderate dehydration may include thirst, dry lips and mouth, and dizziness. Symptoms of severe dehydration may include increased heart rate, confusion, fainting, and not urinating. For severe dehydration, you may need to get fluids through an IV at the hospital. For mild or moderate dehydration, you can usually rehydrate at home by drinking certain fluids as told by your health care provider. What are the risks? Generally, rehydration is safe. However, taking in too much fluid (overhydration) can be a problem. This is rare. Overhydration can cause an electrolyte imbalance, kidney failure, or a decrease in salt (sodium) levels in the body. Supplies needed You will need an oral rehydration solution (ORS) if your health care provider tells you to use one. This is a drink to treat dehydration. It can be found in pharmacies and retail stores. How to rehydrate Fluids Follow instructions from your health care provider for rehydration. The kind of fluid and the amount you should drink depend on your condition. In general, you should choose drinks that you prefer. If told by your health care provider, drink an ORS. Make an ORS by following instructions on the package. Start by drinking small amounts, about  cup (120 mL) every 5-10 minutes. Slowly increase how much you drink until you have taken the amount recommended by your health care provider. Drink enough clear fluids to keep your urine pale yellow. If you were told to drink an ORS, finish it first, then start slowly drinking other clear fluids. Drink fluids such as: Water. This includes sparkling water and flavored water. Drinking only water can lead to having too little sodium in your body (hyponatremia). Follow the advice of your health care provider. Water from ice chips you suck on. Fruit juice with water you add to it (diluted). Sports drinks. Hot or cold herbal teas. Broth-based soups. Milk or milk  products. Food Follow instructions from your health care provider about what to eat while you rehydrate. Your health care provider may recommend that you slowly begin eating regular foods in small amounts. Eat foods that contain a healthy balance of electrolytes, such as bananas, oranges, potatoes, tomatoes, and spinach. Avoid foods that are greasy or contain a lot of sugar. In some cases, you may get nutrition through a feeding tube that is passed through your nose and into your stomach (nasogastric tube, or NG tube). This may be done if you have uncontrolled vomiting or diarrhea. Beverages to avoid Certain beverages may make dehydration worse. While you rehydrate, avoid drinking alcohol. How to tell if you are recovering from dehydration You may be recovering from dehydration if: You are urinating more often than before you started rehydrating. Your urine is pale yellow. Your energy level improves. You vomit less frequently. You have diarrhea less frequently. Your appetite improves or returns to normal. You feel less dizzy or less light-headed. Your skin tone and color start to look more normal. Follow these instructions at home: Take over-the-counter and prescription medicines only as told by your health care provider. Do not take sodium tablets. Doing this can lead to having too much sodium in your body (hypernatremia). Contact a health care provider if: You continue to have symptoms of mild or moderate dehydration, such as: Thirst. Dry lips. Slightly dry mouth. Dizziness. Dark urine or less urine than normal. Muscle cramps. You continue to vomit or have diarrhea. Get help right away if you:  Have symptoms of dehydration that get worse. Have a fever. Have a severe headache. Have been vomiting and the following happens: Your vomiting gets worse or does not go away. Your vomit includes blood or green matter (bile). You cannot eat or drink without vomiting. Have problems with  urination or bowel movements, such as: Diarrhea that gets worse or does not go away. Blood in your stool (feces). This may cause stool to look black and tarry. Not urinating, or urinating only a small amount of very dark urine, within 6-8 hours. Have trouble breathing. Have symptoms that get worse with treatment. These symptoms may represent a serious problem that is an emergency. Do not wait to see if the symptoms will go away. Get medical help right away. Call your local emergency services (911 in the [place] Do not drive yourself to the hospital. Summary Rehydration is the replacement of body fluids and minerals (electrolytes) that are lost during dehydration. Follow instructions from your health care provider for rehydration. The kind of fluid and amount you should drink depend on your condition. Slowly increase how much you drink until you have taken the amount recommended by your health care provider. Contact your health care provider if you continue to show signs of mild or moderate dehydration. This information is not intended to replace advice given to you by your health care provider. Make sure you discuss any questions you have with your health care provider. Document Revised: 04/26/2019 Document Reviewed: 03/06/2019 Elsevier Patient Education  2022 [employer].

## 2021-02-06 NOTE — Progress Notes (Signed)
Symptom Management Consult note [place]    Patient Care Team: [name], NP as PCP - General (Internal Medicine) Valrie Hart, RN as Oncology Nurse Navigator    Name of the patient: Gabrielle [name]  [id number]  03-Oct-1955   Date of visit: 02/06/2021    Chief complaint/ Reason for visit- diarrhea, poor appetite  Oncology History  Adenocarcinoma of left lung, stage 4 ([place])  12/30/2019 Initial Diagnosis   Adenocarcinoma of left lung, stage 4 ([place])   01/16/2020 -  Chemotherapy   Patient is on Treatment Plan : Lung Cancer Cemiplimab q21d     04/08/2020 Cancer Staging   Staging form: Lung, AJCC 8th Edition - Clinical: Stage IVA (cT3, cN0, cM1a) - Signed by Curt Bears, MD on 04/08/2020      Current Therapy: Cemiplimab-rwlc day 1 cycle 19 01/28/21  Interval history- Gabrielle [name] is a 65 yo female presenting to [facility] today with chief complaint of diarrhea x 5 days. Patient describes stool as watery. She states it was initially brown in color, changed to a black color after taking pepto bismol yesterday. She took 4 Imodium in the last 24 hours. She admits to diarrhea slowing down since taking Imodium, reports only 2 episodes. She denies any associated abdominal pain, although states that her stomach feels "unsettled." She has poor appetite and decreased PO intake. She states food simply tastes bad she she is less inclined to eat. She was able to sip water today and eat a cheese sandwich. She denies any recent travel or antibiotic use. No sick contacts or suspicious food intake. She admits to having problem with diarrhea in the past that was able to be controlled with Imodium and a probiotic. She had a low grade temperature x 4 days ago, under 100 that resolved with tylenol. She denies night sweats, weight loss, chills, chest pain, shortness of breath, urinary symptoms, back pain, rash, rectal pain or bleeding,     ROS  All other systems are reviewed and  are negative for acute change except as noted in the HPI.    Allergies  Allergen Reactions   Ace Inhibitors Cough     Past Medical History:  Diagnosis Date   Hyperlipidemia    Hypertension    Pleural effusion    Pneumonia    Stage IV adenocarcinoma of lung, left (HCC)      Past Surgical History:  Procedure Laterality Date   NEPHRECTOMY Left     Social History   Socioeconomic History   Marital status: Married    Spouse name: Not on file   Number of children: Not on file   Years of education: Not on file   Highest education level: Not on file  Occupational History   Not on file  Tobacco Use   Smoking status: Former    Packs/day: 1.00    Years: 40.00    Pack years: 40.00    Types: Cigarettes    Quit date: 2016    Years since quitting: 6.9   Smokeless tobacco: Never  Substance and Sexual Activity   Alcohol use: Not on file   Drug use: Not on file   Sexual activity: Not on file  Other Topics Concern   Not on file  Social History Narrative   Not on file   Social Determinants of Health   Financial Resource Strain: Not on file  Food Insecurity: Not on file  Transportation Needs: Not on file  Physical Activity:  Not on file  Stress: Not on file  Social Connections: Not on file  Intimate Partner Violence: Not on file    Family History  Problem Relation Age of Onset   COPD Mother      Current Outpatient Medications:    magnesium oxide (MAG-OX) 400 (240 Mg) MG tablet, Take 1 tablet (400 mg total) by mouth daily for 14 days., Disp: 14 tablet, Rfl: 0   cholecalciferol (VITAMIN D3) 25 MCG (1000 UNIT) tablet, Take 2,000 Units by mouth daily., Disp: , Rfl:    levothyroxine (SYNTHROID) 50 MCG tablet, TAKE 1 TABLET BY MOUTH EVERY DAY BEFORE BREAKFAST, Disp: 90 tablet, Rfl: 0   losartan-hydrochlorothiazide (HYZAAR) 100-12.5 MG tablet, Take 1 tablet by mouth daily. (Patient not taking: Reported on 01/28/2021), Disp: , Rfl:    metFORMIN (GLUCOPHAGE) 500 MG tablet,  Take 500 mg by mouth 2 (two) times daily with a meal., Disp: , Rfl:    pravastatin (PRAVACHOL) 40 MG tablet, Take 40 mg by mouth daily., Disp: , Rfl:    prochlorperazine (COMPAZINE) 10 MG tablet, Take 1 tablet (10 mg total) by mouth every 6 (six) hours as needed for nausea or vomiting. (Patient not taking: Reported on 01/28/2021), Disp: 30 tablet, Rfl: 0   sertraline (ZOLOFT) 50 MG tablet, Take 75 mg by mouth daily., Disp: , Rfl:  No current facility-administered medications for this visit.  Facility-Administered Medications Ordered in Other Visits:    magnesium sulfate IVPB 2 g 50 mL, 2 g, Intravenous, Once, Walisiewicz, Tashena Ibach E, PA-C, Last Rate: 50 mL/hr at 02/06/21 1549, 2 g at 02/06/21 1549  PHYSICAL EXAM: ECOG FS:1 - Symptomatic but completely ambulatory    Vitals:   02/06/21 1421  BP: 130/63  Pulse: 65  Resp: 17  Temp: 98.1 F (36.7 C)  TempSrc: Temporal  SpO2: 97%  Weight: 135 lb 9.6 oz (61.5 kg)  Height: 5\' 4"  (1.626 m)   Physical Exam Vitals and nursing note reviewed.  Constitutional:      General: She is not in acute distress.    Appearance: She is not ill-appearing.  HENT:     Head: Normocephalic and atraumatic.     Right Ear: External ear normal.     Left Ear: External ear normal.     Nose: Nose normal.     Mouth/Throat:     Mouth: Mucous membranes are dry.     Pharynx: Oropharynx is clear.  Eyes:     General: No scleral icterus.       Right eye: No discharge.        Left eye: No discharge.     Extraocular Movements: Extraocular movements intact.     Conjunctiva/sclera: Conjunctivae normal.     Pupils: Pupils are equal, round, and reactive to light.  Neck:     Vascular: No JVD.  Cardiovascular:     Rate and Rhythm: Normal rate and regular rhythm.     Pulses: Normal pulses.          Radial pulses are 2+ on the right side and 2+ on the left side.     Heart sounds: Normal heart sounds.  Pulmonary:     Comments: Lungs clear to auscultation in all fields.  Symmetric chest rise. No wheezing, rales, or rhonchi. Abdominal:     Tenderness: There is no right CVA tenderness or left CVA tenderness.     Comments: Abdomen is soft, non-distended, and non-tender in all quadrants. No rigidity, no guarding. No peritoneal signs.  Musculoskeletal:  General: Normal range of motion.     Cervical back: Normal range of motion.     Right lower leg: No edema.     Left lower leg: No edema.  Skin:    General: Skin is warm and dry.     Capillary Refill: Capillary refill takes less than 2 seconds.     Findings: No rash.  Neurological:     Mental Status: She is oriented to person, place, and time.     GCS: GCS eye subscore is 4. GCS verbal subscore is 5. GCS motor subscore is 6.     Comments: Fluent speech, no facial droop.  Psychiatric:        Behavior: Behavior normal.       LABORATORY DATA: I have reviewed the data as listed CBC Latest Ref Rng & Units 02/06/2021 01/28/2021 01/07/2021  WBC 4.0 - 10.5 K/uL 3.7(L) 5.1 4.2  Hemoglobin 12.0 - 15.0 g/dL 11.9(L) 11.4(L) 11.5(L)  Hematocrit 36.0 - 46.0 % 36.9 37.2 35.2(L)  Platelets 150 - 400 K/uL 233 219 147(L)     CMP Latest Ref Rng & Units 02/06/2021 01/28/2021 01/07/2021  Glucose 70 - 99 mg/dL 133(H) 141(H) 142(H)  BUN 8 - 23 mg/dL 18 14 13   Creatinine 0.44 - 1.00 mg/dL 0.91 0.86 0.91  Sodium 135 - 145 mmol/L 139 138 139  Potassium 3.5 - 5.1 mmol/L 4.3 4.1 3.6  Chloride 98 - 111 mmol/L 104 111 112(H)  CO2 22 - 32 mmol/L 24 19(L) 20(L)  Calcium 8.9 - 10.3 mg/dL 9.3 9.1 8.9  Total Protein 6.5 - 8.1 g/dL 6.8 6.8 6.6  Total Bilirubin 0.3 - 1.2 mg/dL 0.2(L) <0.2(L) 0.4  Alkaline Phos 38 - 126 U/L 76 64 62  AST 15 - 41 U/L 14(L) 20 20  ALT 0 - 44 U/L 11 22 20        RADIOGRAPHIC STUDIES: I have personally reviewed the radiological images as listed and agreed with the findings in the report. No images are attached to the encounter. No results found.   ASSESSMENT & PLAN: Patient is a 65 y.o.  female with history of stage IV non-small cell lung cancer adenocarcinoma currently onLibtayo every 3 weeks followed by oncologist Dr. Julien Nordmann.  #) Diarrhea- Patient afebrile, HDS, non toxic appearing. Looks dehydrated with dry mucus membranes. No abdominal tenderness on exam. IVF and zofran given. Patient tolerating PO intake on reassessment. Stool PCR and C diff in process. CBC shows leukopenia 3.7, hemoglobin consistent with baseline, normal platelets, ANC 2.0. CMP overall unremarkable. Diarrhea improving with Imodium, has not yet maxed out on OTC dose. Based on number of bowel movements today patient rating grade 1 for ICI colitis.  Recommended patient increase Imodium to maximum dose to see full benefit. If diarrhea does not improve despite OTC management I strongly encouraged patient to be seen again in clinic for reassessment as she might require steroids. She is agreeable with plan. Discussed ED precautions.  #) Hypomagnesia- Likely 2/2 high GI output. level today is 1.4. Will replete with IV today and send prescription for PO. Will have it rechecked at next onc appointment.   #)Stage IV lung cancer- continue treatment per medical oncologist.    Visit Diagnosis: 1. Diarrhea, unspecified type   2. Poor fluid intake   3. Hypomagnesemia   4. Adenocarcinoma of left lung, stage 4 (HCC)      No orders of the defined types were placed in this encounter.   All questions were answered. The patient knows  to call the clinic with any problems, questions or concerns. No barriers to learning was detected.  I have spent a total of 30 minutes minutes of face-to-face and non-face-to-face time, preparing to see the patient, obtaining and/or reviewing separately obtained history, performing a medically appropriate examination, counseling and educating the patient, ordering tests,  documenting clinical information in the electronic health record, and care coordination.     Thank you for allowing me to  participate in the care of this patient.    Barrie Folk, PA-C Department of Hematology/Oncology [facility] at [facility] Phone: [phone]  [phone]    02/06/2021 4:46 PM

## 2021-02-07 ENCOUNTER — Ambulatory Visit (HOSPITAL_BASED_OUTPATIENT_CLINIC_OR_DEPARTMENT_OTHER): Payer: Medicare Other | Admitting: Physician Assistant

## 2021-02-07 DIAGNOSIS — A029 Salmonella infection, unspecified: Secondary | ICD-10-CM

## 2021-02-07 LAB — GASTROINTESTINAL PANEL BY PCR, STOOL (REPLACES STOOL CULTURE)

## 2021-02-07 NOTE — Progress Notes (Signed)
I connected with Gabrielle [name] on 02/07/21 at  4:00 PM EST by telephone and verified that I am speaking with the correct person using two identifiers.   I discussed the limitations, risks, security and privacy concerns of performing an evaluation and management service by telemedicine and the availability of in-person appointments. I also discussed with the patient that there may be a patient responsible charge related to this service. The patient expressed understanding and agreed to proceed.    Patient's location: home Provider's location: Gabrielle [name]       Symptom Management Consult note Gabrielle [name]    Patient Care Team: [name], NP as PCP - General (Internal Medicine) Valrie Hart, RN as Oncology Nurse Navigator    Name of the patient: Gabrielle [name]  [id number]  1955-09-10   Date of visit: 02/07/2021    Chief complaint/ Reason for visit-  f/u diarrhea  Oncology History  Adenocarcinoma of left lung, stage 4 ([place])  12/30/2019 Initial Diagnosis   Adenocarcinoma of left lung, stage 4 ([place])   01/16/2020 -  Chemotherapy   Patient is on Treatment Plan : Lung Cancer Cemiplimab q21d     04/08/2020 Cancer Staging   Staging form: Lung, AJCC 8th Edition - Clinical: Stage IVA (cT3, cN0, cM1a) - Signed by Curt Bears, MD on 04/08/2020      Current Therapy: Cemiplimab last treatment 01/28/21  Interval history- Gabrielle [name] is a 65 yo female with above described history of lung cancer contacted via telephone today for follow up about her diarrhea. Patient seen in clinic yesterday with complaint of watery diarrhea x 5 days which makes today day 6 of symptoms. She was given IVF, IV magnesium, IV zofran and stool cultures were collected yesterday. She reports today she is feeling slightly improved. She has had 2 episodes of diarrhea today, still watery. She is taking imodium and has taken the maximum recommended dose for today. She is  tolerating PO intake with water and protein shakes. She denies any sick contacts, fever, chills, abdominal pain, nausea, urinary symptoms, rectal pain, blood in stool.      ROS  All other systems are reviewed and are negative for acute change except as noted in the HPI.    Allergies  Allergen Reactions   Ace Inhibitors Cough     Past Medical History:  Diagnosis Date   Hyperlipidemia    Hypertension    Pleural effusion    Pneumonia    Stage IV adenocarcinoma of lung, left (HCC)      Past Surgical History:  Procedure Laterality Date   NEPHRECTOMY Left     Social History   Socioeconomic History   Marital status: Married    Spouse name: Not on file   Number of children: Not on file   Years of education: Not on file   Highest education level: Not on file  Occupational History   Not on file  Tobacco Use   Smoking status: Former    Packs/day: 1.00    Years: 40.00    Pack years: 40.00    Types: Cigarettes    Quit date: 2016    Years since quitting: 6.9   Smokeless tobacco: Never  Substance and Sexual Activity   Alcohol use: Not on file   Drug use: Not on file   Sexual activity: Not on file  Other Topics Concern   Not on file  Social History Narrative   Not on file  Social Determinants of Health   Financial Resource Strain: Not on file  Food Insecurity: Not on file  Transportation Needs: Not on file  Physical Activity: Not on file  Stress: Not on file  Social Connections: Not on file  Intimate Partner Violence: Not on file    Family History  Problem Relation Age of Onset   COPD Mother      Current Outpatient Medications:    cholecalciferol (VITAMIN D3) 25 MCG (1000 UNIT) tablet, Take 2,000 Units by mouth daily., Disp: , Rfl:    levothyroxine (SYNTHROID) 50 MCG tablet, TAKE 1 TABLET BY MOUTH EVERY DAY BEFORE BREAKFAST, Disp: 90 tablet, Rfl: 0   losartan-hydrochlorothiazide (HYZAAR) 100-12.5 MG tablet, Take 1 tablet by mouth daily. (Patient not  taking: Reported on 01/28/2021), Disp: , Rfl:    magnesium oxide (MAG-OX) 400 (240 Mg) MG tablet, Take 1 tablet (400 mg total) by mouth daily for 14 days., Disp: 14 tablet, Rfl: 0   metFORMIN (GLUCOPHAGE) 500 MG tablet, Take 500 mg by mouth 2 (two) times daily with a meal., Disp: , Rfl:    pravastatin (PRAVACHOL) 40 MG tablet, Take 40 mg by mouth daily., Disp: , Rfl:    prochlorperazine (COMPAZINE) 10 MG tablet, Take 1 tablet (10 mg total) by mouth every 6 (six) hours as needed for nausea or vomiting. (Patient not taking: Reported on 01/28/2021), Disp: 30 tablet, Rfl: 0   sertraline (ZOLOFT) 50 MG tablet, Take 75 mg by mouth daily., Disp: , Rfl:   PHYSICAL EXAM: ECOG FS:1 - Symptomatic but completely ambulatory   There were no vitals filed for this visit. Physical Exam  Patient speaking in full sentences, no respiratory distress   LABORATORY DATA: I have reviewed the data as listed CBC Latest Ref Rng & Units 02/06/2021 01/28/2021 01/07/2021  WBC 4.0 - 10.5 K/uL 3.7(L) 5.1 4.2  Hemoglobin 12.0 - 15.0 g/dL 11.9(L) 11.4(L) 11.5(L)  Hematocrit 36.0 - 46.0 % 36.9 37.2 35.2(L)  Platelets 150 - 400 K/uL 233 219 147(L)     CMP Latest Ref Rng & Units 02/06/2021 01/28/2021 01/07/2021  Glucose 70 - 99 mg/dL 133(H) 141(H) 142(H)  BUN 8 - 23 mg/dL 18 14 13   Creatinine 0.44 - 1.00 mg/dL 0.91 0.86 0.91  Sodium 135 - 145 mmol/L 139 138 139  Potassium 3.5 - 5.1 mmol/L 4.3 4.1 3.6  Chloride 98 - 111 mmol/L 104 111 112(H)  CO2 22 - 32 mmol/L 24 19(L) 20(L)  Calcium 8.9 - 10.3 mg/dL 9.3 9.1 8.9  Total Protein 6.5 - 8.1 g/dL 6.8 6.8 6.6  Total Bilirubin 0.3 - 1.2 mg/dL 0.2(L) <0.2(L) 0.4  Alkaline Phos 38 - 126 U/L 76 64 62  AST 15 - 41 U/L 14(L) 20 20  ALT 0 - 44 U/L 11 22 20        RADIOGRAPHIC STUDIES: I have personally reviewed the radiological images as listed and agreed with the findings in the report. No images are attached to the encounter. No results found.   ASSESSMENT &  PLAN: Patient is a 65 y.o. female with history of stage IV adenocarcinoma lung cancer followed by oncologist Dr. Julien Nordmann.  #) Diarrhea- stool PCR test came back positive for salmonella. C diff antigen test is positive, however C diff PCR is negative. I called patient to inform her of the results. With her report of diarrhea improving today, only 2 episodes in 24 hours and no severe symptoms of fever or abdominal pain, she plans to continue symptomatic treatment and knows to  push fluids. Reminded her of ED precautions and encouraged her to call clinic next week if she would like to IVF.    Visit Diagnosis: 1. Salmonella      No orders of the defined types were placed in this encounter.   All questions were answered. The patient knows to call the clinic with any problems, questions or concerns. No barriers to learning was detected.  I have spent a total of 10 minutes minutes of non-face-to-face time, preparing to see the patient, obtaining and/or reviewing separately obtained history, counseling and educating the patient, documenting clinical information in the electronic health record, and care coordination.     Thank you for allowing me to participate in the care of this patient.    Barrie Folk, PA-C Department of Hematology/Oncology [facility] at [facility] Phone: [phone]  [phone]    02/07/2021 4:56 PM

## 2021-02-14 ENCOUNTER — Ambulatory Visit (HOSPITAL_BASED_OUTPATIENT_CLINIC_OR_DEPARTMENT_OTHER)
Admission: RE | Admit: 2021-02-14 | Discharge: 2021-02-14 | Disposition: A | Discharge status: Home / Self Care | Payer: Medicare Other | Source: Ambulatory Visit | Attending: Internal Medicine | Admitting: Internal Medicine

## 2021-02-14 ENCOUNTER — Other Ambulatory Visit: Payer: Self-pay

## 2021-02-14 DIAGNOSIS — C3490 Malignant neoplasm of unspecified part of unspecified bronchus or lung: Secondary | ICD-10-CM | POA: Insufficient documentation

## 2021-02-14 MED ORDER — IOHEXOL 300 MG/ML  SOLN
100.0000 mL | Freq: Once | INTRAMUSCULAR | Status: AC | PRN
  Administered 2021-02-14: 100 mL via INTRAVENOUS

## 2021-02-18 ENCOUNTER — Encounter: Payer: Self-pay | Admitting: Internal Medicine

## 2021-02-18 ENCOUNTER — Other Ambulatory Visit: Payer: Self-pay | Admitting: Medical Oncology

## 2021-02-18 ENCOUNTER — Inpatient Hospital Stay: Payer: Medicare Other

## 2021-02-18 ENCOUNTER — Inpatient Hospital Stay (HOSPITAL_BASED_OUTPATIENT_CLINIC_OR_DEPARTMENT_OTHER): Payer: Medicare Other | Admitting: Internal Medicine

## 2021-02-18 ENCOUNTER — Other Ambulatory Visit: Payer: Self-pay

## 2021-02-18 VITALS — BP 121/64 | HR 56 | Temp 97.8°F | Wt 139.1 lb

## 2021-02-18 DIAGNOSIS — Z5112 Encounter for antineoplastic immunotherapy: Secondary | ICD-10-CM

## 2021-02-18 DIAGNOSIS — C3492 Malignant neoplasm of unspecified part of left bronchus or lung: Secondary | ICD-10-CM

## 2021-02-18 LAB — CMP (CANCER CENTER ONLY)
ALT: 17 U/L (ref 0–44)
AST: 15 U/L (ref 15–41)
Albumin: 3.2 g/dL — ABNORMAL LOW (ref 3.5–5.0)
Alkaline Phosphatase: 63 U/L (ref 38–126)
Anion gap: 9 (ref 5–15)
BUN: 19 mg/dL (ref 8–23)
CO2: 25 mmol/L (ref 22–32)
Calcium: 8.9 mg/dL (ref 8.9–10.3)
Chloride: 105 mmol/L (ref 98–111)
Creatinine: 0.87 mg/dL (ref 0.44–1.00)
GFR, Estimated: >60 mL/min (ref >60)
Glucose, Bld: 113 mg/dL — ABNORMAL HIGH (ref 70–99)
Potassium: 4.2 mmol/L (ref 3.5–5.1)
Sodium: 139 mmol/L (ref 135–145)
Total Bilirubin: 0.3 mg/dL (ref 0.3–1.2)
Total Protein: 6.4 g/dL — ABNORMAL LOW (ref 6.5–8.1)

## 2021-02-18 LAB — CBC WITH DIFFERENTIAL (CANCER CENTER ONLY)
Abs Immature Granulocytes: 0.02 10*3/uL (ref 0.00–0.07)
Basophils Absolute: 0.1 10*3/uL (ref 0.0–0.1)
Basophils Relative: 2 %
Eosinophils Absolute: 0.2 10*3/uL (ref 0.0–0.5)
Eosinophils Relative: 4 %
HCT: 35 % — ABNORMAL LOW (ref 36.0–46.0)
Hemoglobin: 11 g/dL — ABNORMAL LOW (ref 12.0–15.0)
Immature Granulocytes: 0 %
Lymphocytes Relative: 17 %
Lymphs Abs: 0.9 10*3/uL (ref 0.7–4.0)
MCH: 27.7 pg (ref 26.0–34.0)
MCHC: 31.4 g/dL (ref 30.0–36.0)
MCV: 88.2 fL (ref 80.0–100.0)
Monocytes Absolute: 0.5 10*3/uL (ref 0.1–1.0)
Monocytes Relative: 9 %
Neutro Abs: 3.7 10*3/uL (ref 1.7–7.7)
Neutrophils Relative %: 68 %
Platelet Count: 270 10*3/uL (ref 150–400)
RBC: 3.97 MIL/uL (ref 3.87–5.11)
RDW: 14.6 % (ref 11.5–15.5)
WBC Count: 5.4 10*3/uL (ref 4.0–10.5)
nRBC: 0 % (ref 0.0–0.2)

## 2021-02-18 LAB — MAGNESIUM: Magnesium: 1.9 mg/dL (ref 1.7–2.4)

## 2021-02-18 MED ORDER — SODIUM CHLORIDE 0.9 % IV SOLN: Freq: Once | INTRAVENOUS | Status: AC

## 2021-02-18 MED ORDER — SODIUM CHLORIDE 0.9 % IV SOLN
350.0000 mg | Freq: Once | INTRAVENOUS | Status: AC
  Administered 2021-02-18: 350 mg via INTRAVENOUS
  Filled 2021-02-18: qty 7

## 2021-02-18 NOTE — Patient Instructions (Signed)
[facility] ONCOLOGY  Discharge Instructions: Thank you for choosing [facility] to provide your oncology and hematology care.   If you have a lab appointment with the [facility], please go directly to the [facility] and check in at the registration area.   Wear comfortable clothing and clothing appropriate for easy access to any Portacath or PICC line.   We strive to give you quality time with your provider. You may need to reschedule your appointment if you arrive late (15 or more minutes).  Arriving late affects you and other patients whose appointments are after yours.  Also, if you miss three or more appointments without notifying the office, you may be dismissed from the clinic at the providers discretion.      For prescription refill requests, have your pharmacy contact our office and allow 72 hours for refills to be completed.    Today you received the following chemotherapy and/or immunotherapy agent: Cemiplimab (Libtayo).   To help prevent nausea and vomiting after your treatment, we encourage you to take your nausea medication as directed.  BELOW ARE SYMPTOMS THAT SHOULD BE REPORTED IMMEDIATELY: *FEVER GREATER THAN 100.4 F (38 C) OR HIGHER *CHILLS OR SWEATING *NAUSEA AND VOMITING THAT IS NOT CONTROLLED WITH YOUR NAUSEA MEDICATION *UNUSUAL SHORTNESS OF BREATH *UNUSUAL BRUISING OR BLEEDING *URINARY PROBLEMS (pain or burning when urinating, or frequent urination) *BOWEL PROBLEMS (unusual diarrhea, constipation, pain near the anus) TENDERNESS IN MOUTH AND THROAT WITH OR WITHOUT PRESENCE OF ULCERS (sore throat, sores in mouth, or a toothache) UNUSUAL RASH, SWELLING OR PAIN  UNUSUAL VAGINAL DISCHARGE OR ITCHING   Items with * indicate a potential emergency and should be followed up as soon as possible or go to the Emergency Department if any problems should occur.  Please show the CHEMOTHERAPY ALERT CARD or IMMUNOTHERAPY ALERT CARD at  check-in to the Emergency Department and triage nurse.  Should you have questions after your visit or need to cancel or reschedule your appointment, please contact [facility]  Dept: [phone]  and follow the prompts.  Office hours are 8:00 a.m. to 4:30 p.m. Monday - Friday. Please note that voicemails left after 4:00 p.m. may not be returned until the following business day.  We are closed weekends and major holidays. You have access to a nurse at all times for urgent questions. Please call the main number to the clinic Dept: [phone] and follow the prompts.   For any non-urgent questions, you may also contact your provider using MyChart. We now offer e-Visits for anyone 63 and older to request care online for non-urgent symptoms. For details visit [email or web address].   Also download the MyChart app! Go to the app store, search "MyChart", open the app, select [facility], and log in with your MyChart username and password.  Due to Covid, a mask is required upon entering the hospital/clinic. If you do not have a mask, one will be given to you upon arrival. For doctor visits, patients may have 1 support person aged 70 or older with them. For treatment visits, patients cannot have anyone with them due to current Covid guidelines and our immunocompromised population.

## 2021-02-18 NOTE — Progress Notes (Signed)
[place] [phone]) [phone]   [phone]  OFFICE PROGRESS NOTE  [name], NP [place] [address]  DIAGNOSIS: stage IV (T3, N0, M1 a) non-small cell lung cancer, adenocarcinoma presented with large left lower lobe lung mass in addition to another lingual mass and malignant left pleural effusion diagnosed in September 2021  Biomarker Findings Tumor Mutational Burden - 13 Muts/Mb Microsatellite status - MS-Stable Genomic Findings For a complete list of the genes assayed, please refer to the Appendix. KRAS G12C PRDM1 D264f*46 TERT promoter -146C>T TP53 V157F 7 Disease relevant genes with no reportable alterations: ALK, BRAF, EGFR, ERBB2, MET, RET, ROS1   PDL1 Expression: 95%  PRIOR THERAPY: None.  CURRENT THERAPY: Libtayo (Cempilimab) 350 mg IV every 3 weeks.  First dose January 15, 2020.  Status post 19 cycles.  INTERVAL HISTORY: Gabrielle COZZA65y.o. female returns to the clinic today for follow-up visit accompanied by her husband.  The patient is feeling fine today with no concerning complaints except for knee arthritis.  She denied having any current chest pain, shortness of breath, cough or hemoptysis.  She denied having any fever or chills.  She has no nausea, vomiting, diarrhea or constipation.  She has no headache or visual changes.  She denied having any significant weight loss or night sweats.  She has been tolerating her treatment with Libtayo (Cempilimab) fairly well.  The patient is here today for evaluation before starting cycle #20 with repeat CT scan of the chest, abdomen and pelvis for restaging of her disease.   MEDICAL HISTORY: Past Medical History:  Diagnosis Date   Hyperlipidemia    Hypertension    Pleural effusion    Pneumonia    Stage IV adenocarcinoma of lung, left (HCC)     ALLERGIES:  is allergic to ace inhibitors.  MEDICATIONS:  Current Outpatient Medications  Medication Sig  Dispense Refill   cholecalciferol (VITAMIN D3) 25 MCG (1000 UNIT) tablet Take 2,000 Units by mouth daily.     levothyroxine (SYNTHROID) 50 MCG tablet TAKE 1 TABLET BY MOUTH EVERY DAY BEFORE BREAKFAST 90 tablet 0   losartan-hydrochlorothiazide (HYZAAR) 100-12.5 MG tablet Take 1 tablet by mouth daily. (Patient not taking: Reported on 01/28/2021)     magnesium oxide (MAG-OX) 400 (240 Mg) MG tablet Take 1 tablet (400 mg total) by mouth daily for 14 days. 14 tablet 0   metFORMIN (GLUCOPHAGE) 500 MG tablet Take 500 mg by mouth 2 (two) times daily with a meal.     pravastatin (PRAVACHOL) 40 MG tablet Take 40 mg by mouth daily.     prochlorperazine (COMPAZINE) 10 MG tablet Take 1 tablet (10 mg total) by mouth every 6 (six) hours as needed for nausea or vomiting. (Patient not taking: Reported on 01/28/2021) 30 tablet 0   sertraline (ZOLOFT) 50 MG tablet Take 75 mg by mouth daily.     No current facility-administered medications for this visit.    SURGICAL HISTORY:  Past Surgical History:  Procedure Laterality Date   NEPHRECTOMY Left     REVIEW OF SYSTEMS:  Constitutional: negative Eyes: negative Ears, nose, mouth, throat, and face: negative Respiratory: negative Cardiovascular: negative Gastrointestinal: negative Genitourinary:negative Integument/breast: negative Hematologic/lymphatic: negative Musculoskeletal:positive for arthralgias Neurological: negative Behavioral/Psych: negative Endocrine: negative Allergic/Immunologic: negative   PHYSICAL EXAMINATION: General appearance: alert, cooperative, and no distress Head: Normocephalic, without obvious abnormality, atraumatic Neck: no adenopathy, no JVD, supple, symmetrical, trachea midline, and thyroid not enlarged,  symmetric, no tenderness/mass/nodules Lymph nodes: Cervical, supraclavicular, and axillary nodes normal. Resp: clear to auscultation bilaterally Back: symmetric, no curvature. ROM normal. No CVA tenderness. Cardio: regular  rate and rhythm, S1, S2 normal, no murmur, click, rub or gallop GI: soft, non-tender; bowel sounds normal; no masses,  no organomegaly Extremities: extremities normal, atraumatic, no cyanosis or edema Neurologic: Alert and oriented X 3, normal strength and tone. Normal symmetric reflexes. Normal coordination and gait  ECOG PERFORMANCE STATUS: 1 - Symptomatic but completely ambulatory  Blood pressure 121/64, pulse (!) 56, temperature 97.8 F (36.6 C), temperature source Tympanic, weight 139 lb 1.6 oz (63.1 kg), SpO2 99 %.  LABORATORY DATA: Lab Results  Component Value Date   WBC 5.4 02/18/2021   HGB 11.0 (L) 02/18/2021   HCT 35.0 (L) 02/18/2021   MCV 88.2 02/18/2021   PLT 270 02/18/2021      Chemistry      Component Value Date/Time   NA 139 02/06/2021 1400   K 4.3 02/06/2021 1400   CL 104 02/06/2021 1400   CO2 24 02/06/2021 1400   BUN 18 02/06/2021 1400   CREATININE 0.91 02/06/2021 1400      Component Value Date/Time   CALCIUM 9.3 02/06/2021 1400   ALKPHOS 76 02/06/2021 1400   AST 14 (L) 02/06/2021 1400   ALT 11 02/06/2021 1400   BILITOT 0.2 (L) 02/06/2021 1400       RADIOGRAPHIC STUDIES: CT CHEST ABDOMEN PELVIS W CONTRAST  Result Date: 02/16/2021 CLINICAL DATA:  Non-small cell lung cancer staging EXAM: CT CHEST, ABDOMEN, AND PELVIS WITH CONTRAST TECHNIQUE: Multidetector CT imaging of the chest, abdomen and pelvis was performed following the standard protocol during bolus administration of intravenous contrast. CONTRAST:  117m OMNIPAQUE IOHEXOL 300 MG/ML  SOLN COMPARISON:  CT chest, abdomen and pelvis dated December 16, 2020 FINDINGS: CT CHEST FINDINGS Cardiovascular: Normal heart size. No pericardial effusion. Atherosclerotic disease of the thoracic aorta. Three-vessel coronary artery calcifications. Mediastinum/Nodes: Enlarged right axillary lymph nodes, measuring up to 1.0 cm in short axis on series 2, image 21, unchanged compared to prior exam. Right  retrosternal/epicardial lymph node measuring 1.1 cm in short axis on series 2, image 30, unchanged in size compared to prior exam. Other smaller mediastinal lymph nodes are also stable in size. Lungs/Pleura: Central airways are patent. Unchanged left-sided volume loss. Small left pleural effusion is unchanged in size when compared to prior exam. Juxtapleural linear opacities which are likely due to scarring atelectasis, also unchanged compared to prior. Stable small solid pulmonary nodules. Reference solid nodule of the right upper lobe measuring 3 mm on series 4, image 40. No new or enlarging pulmonary nodules. Musculoskeletal: No aggressive appearing osseous lesions. CT ABDOMEN PELVIS FINDINGS Hepatobiliary: Small low-attenuation lesion of the right lobe of the liver seen on series 2, image 51, unchanged compared to prior exam and possibly a simple cyst. No suspicious liver lesions. Normal appearance of the gallbladder. No biliary ductal dilation. Pancreas: Unremarkable. No pancreatic ductal dilatation or surrounding inflammatory changes. Spleen: Normal in size without focal abnormality. Adrenals/Urinary Tract: Bilateral adrenal glands are unremarkable. Left kidney is surgically absent. Normal appearance of the right kidney with no evidence of hydronephrosis or nephrolithiasis. Bladder is decompressed. Stomach/Bowel: Small hiatal hernia. Normal appearance of the appendix. No bowel wall thickening, inflammatory change or evidence of obstruction. Vascular/Lymphatic: Prominent subcentimeter retroperitoneal lymph nodes are seen in the left nephrectomy bed, unchanged compared to prior exam. No pathologically enlarged lymph nodes seen in the abdomen or pelvis. Atherosclerotic disease  of the abdominal aorta. Reproductive: Uterus and bilateral adnexa are unremarkable. Other: No abdominal wall hernia or abnormality. No abdominopelvic ascites. Musculoskeletal: No acute or significant osseous findings. IMPRESSION: 1. Stable  posttreatment changes of the left hemithorax. 2. Unchanged size of mildly enlarged right axillary and retrosternal/epicardial lymph nodes. 3. No evidence of metastatic disease in the abdomen or pelvis. 4.  Aortic Atherosclerosis (ICD10-I70.0). Electronically Signed   By: Yetta Glassman M.D.   On: 02/16/2021 15:48     ASSESSMENT AND PLAN: This is a very pleasant 65 years old white female recently diagnosed with a stage IV (T3, N0, M1 a) non-small cell lung cancer, adenocarcinoma presented with large left lower lobe lung mass in addition to another lingular mass and malignant left pleural effusion diagnosed in September 2021.  The molecular studies showed positive PD-L1 expression of 95%.  The patient also has K-ras G12C mutation which will be an option for the second line treatment. With the high PD-L1 expression I discussed with the patient treatment with single agent Libtayo (Cempilimab) 350 mg IV every 3 weeks for up to 2 years unless the patient has evidence for disease progression or unacceptable toxicity. The patient started the first cycle of this treatment on January 15, 2020. She is status post 19 cycles.  She has been tolerating this treatment fairly well with no concerning adverse effects except for arthralgia. She had repeat CT scan of the chest, abdomen pelvis performed recently.  I personally and independently reviewed the scan and discussed the results with the patient and her husband. Her scan showed no concerning findings for disease progression. I recommended for her to continue her current treatment with Libtayo (Cempilimab) and she will proceed with cycle #20 today. I will see the patient back for follow-up visit in 3 weeks for evaluation with the start of cycle #21. For the hypomagnesemia, will give the patient a refill of magnesium oxide. She was advised to call immediately if she has any other concerning symptoms in the interval. The patient voices understanding of current  disease status and treatment options and is in agreement with the current care plan.  All questions were answered. The patient knows to call the clinic with any problems, questions or concerns. We can certainly see the patient much sooner if necessary.   Disclaimer: This note was dictated with voice recognition software. Similar sounding words can inadvertently be transcribed and may not be corrected upon review.

## 2021-02-28 NOTE — Progress Notes (Signed)
[name] OFFICE PROGRESS NOTE  Gabrielle Dandy, NP [address] [place] [address]  DIAGNOSIS:  Stage IV (T3, N0, M1 a) non-small cell lung cancer, adenocarcinoma presented with large left lower lobe lung mass in addition to another lingual mass and malignant left pleural effusion diagnosed in September 2021   Biomarker Findings Tumor Mutational Burden - 13 Muts/Mb Microsatellite status - MS-Stable Genomic Findings For a complete list of the genes assayed, please refer to the Appendix. KRAS G12C PRDM1 D272f*46 TERT promoter -146C>T TP53 V157F 7 Disease relevant genes with no reportable alterations: ALK, BRAF, EGFR, ERBB2, MET, RET, ROS1   PDL1 Expression: 95%  PRIOR THERAPY: None  CURRENT THERAPY: Libtayo (Cempilimab) 350 mg IV every 3 weeks.  First dose January 15, 2020.  Status post 20 cycles  [name] 65y.o. female returns to the clinic for a follow up visit accompanied by her husband. The patient is feeling well today without any concerning complaints except she is endorsing some right knee swelling.  She is also reporting radiating pain down her right hip to her calf.  The patient had a restaging CT scan earlier in December 2022 which did not show any metastatic disease to the hip or spine.  Did not note any degenerative changes in the spine.  Patient has not established with an orthopedic provider.  The patient tried taking Aleve without any significant improvement.  The patient denies any erythema in her calf.  She has some tenderness in the calf and bilateral lower extremity swelling   The patient continues to tolerate treatment with immunotherapy with Libtayo well without any adverse side effects except she does have some scattered skin lesions which are consistent with immunotherapy mediated although these are getting better recently. These do not bother her. She also takes benadryl if needed. She also had deviously had  intermittent diarrhea which fortunately, has improved lately.  Of note, the patient was found to have salmonella in November 2022.   Denies any fever, chills, or night sweats.  Her weight is stable. Denies any chest pain, cough, or hemoptysis. She denies any significant shortness of breath. She has had a stable pleural effusion which she is not very symptomatic from. Denies any nausea, vomiting, or constipation. Denies any headache or visual changes. The patient is here today for evaluation prior to starting cycle # 22   MEDICAL HISTORY: Past Medical History:  Diagnosis Date   Hyperlipidemia    Hypertension    Pleural effusion    Pneumonia    Stage IV adenocarcinoma of lung, left (HCC)     ALLERGIES:  is allergic to ace inhibitors.  MEDICATIONS:  Current Outpatient Medications  Medication Sig Dispense Refill   cholecalciferol (VITAMIN D3) 25 MCG (1000 UNIT) tablet Take 2,000 Units by mouth daily.     levothyroxine (SYNTHROID) 50 MCG tablet TAKE 1 TABLET BY MOUTH EVERY DAY BEFORE BREAKFAST 90 tablet 0   losartan-hydrochlorothiazide (HYZAAR) 100-12.5 MG tablet Take 1 tablet by mouth daily. (Patient not taking: Reported on 01/28/2021)     metFORMIN (GLUCOPHAGE) 500 MG tablet Take 500 mg by mouth 2 (two) times daily with a meal.     pravastatin (PRAVACHOL) 40 MG tablet Take 40 mg by mouth daily.     prochlorperazine (COMPAZINE) 10 MG tablet Take 1 tablet (10 mg total) by mouth every 6 (six) hours as needed for nausea or vomiting. (Patient not taking: Reported on 01/28/2021) 30 tablet 0  sertraline (ZOLOFT) 50 MG tablet Take 75 mg by mouth daily.     No current facility-administered medications for this visit.    SURGICAL HISTORY:  Past Surgical History:  Procedure Laterality Date   NEPHRECTOMY Left     REVIEW OF SYSTEMS:   Constitutional: Negative for appetite change, chills, fatigue, and fever. HENT: Negative for mouth sores, nosebleeds, sore throat and trouble swallowing.    Eyes: Negative for eye problems and icterus.  Respiratory: Negative for cough, hemoptysis, shortness of breath and wheezing.   Cardiovascular: Negative for chest pain.  Positive for bilateral lower extremity swelling right greater than left. Gastrointestinal: Positive for intermittent diarrhea. Negative for abdominal pain, constipation, nausea and vomiting.  Genitourinary: Negative for bladder incontinence, difficulty urinating, dysuria, frequency and hematuria.   Musculoskeletal: Positive for right knee pain.  Positive for radiating pain down the right leg.  Negative for back pain, gait problem, neck pain and neck stiffness.  Skin: Positive for intermittent skin lesions (improved from prior).  Neurological: Negative for dizziness, extremity weakness, gait problem, headaches, light-headedness and seizures.  Hematological: Negative for adenopathy. Does not bruise/bleed easily.  Psychiatric/Behavioral: Negative for confusion, depression and sleep disturbance. The patient is not nervous/anxious.   PHYSICAL EXAMINATION:  Blood pressure 127/78, pulse 62, temperature 97.7 F (36.5 C), temperature source Temporal, resp. rate 16, height _0  (1.626 m), weight 139 lb 14.4 oz (63.5 kg), SpO2 100 %.  ECOG PERFORMANCE STATUS: 1  Physical Exam  CConstitutional: Oriented to person, place, and time and well-developed, well-nourished, and in no distress.   HENT: Head: Normocephalic and atraumatic. Mouth/Throat: Oropharynx is clear and moist. No oropharyngeal exudate. Eyes: Conjunctivae are normal. Right eye exhibits no discharge. Left eye exhibits no discharge. No scleral icterus. Neck: Normal range of motion. Neck supple. Cardiovascular: Normal rate, regular rhythm, normal heart sounds and intact distal pulses.   Pulmonary/Chest: Effort normal. No respiratory distress. No wheezes. No rales. Abdominal: Soft. Bowel sounds are normal. Exhibits no distension and no mass. There is no tenderness.   Musculoskeletal: Normal range of motion.  Bilateral lower extremity swelling right greater than left.  Calf tenderness on right leg.  No significant erythema.  Right knee appears greater than left knee.  No erythema. Lymphadenopathy:    No cervical adenopathy.  Neurological: Alert and oriented to person, place, and time. Exhibits normal muscle tone. Gait normal. Coordination normal. Skin: Some dry small scattered skin lesions on chest and extremities (improved from prior). Skin is warm and dry. Not diaphoretic. No erythema. No pallor.  Psychiatric: Mood, memory and judgment normal. Vitals reviewed.  LABORATORY DATA: Lab Results  Component Value Date   WBC 5.6 03/11/2021   HGB 11.1 (L) 03/11/2021   HCT 35.1 (L) 03/11/2021   MCV 86.9 03/11/2021   PLT 323 03/11/2021      Chemistry      Component Value Date/Time   NA 136 03/11/2021 1356   K 3.9 03/11/2021 1356   CL 105 03/11/2021 1356   CO2 23 03/11/2021 1356   BUN 23 03/11/2021 1356   CREATININE 0.77 03/11/2021 1356      Component Value Date/Time   CALCIUM 9.6 03/11/2021 1356   ALKPHOS 61 03/11/2021 1356   AST 15 03/11/2021 1356   ALT 16 03/11/2021 1356   BILITOT 0.3 03/11/2021 1356       RADIOGRAPHIC STUDIES:  CT CHEST ABDOMEN PELVIS W CONTRAST  Result Date: 02/16/2021 CLINICAL DATA:  Non-small cell lung cancer staging EXAM: CT CHEST, ABDOMEN, AND PELVIS WITH  CONTRAST TECHNIQUE: Multidetector CT imaging of the chest, abdomen and pelvis was performed following the standard protocol during bolus administration of intravenous contrast. CONTRAST:  167m OMNIPAQUE IOHEXOL 300 MG/ML  SOLN COMPARISON:  CT chest, abdomen and pelvis dated December 16, 2020 FINDINGS: CT CHEST FINDINGS Cardiovascular: Normal heart size. No pericardial effusion. Atherosclerotic disease of the thoracic aorta. Three-vessel coronary artery calcifications. Mediastinum/Nodes: Enlarged right axillary lymph nodes, measuring up to 1.0 cm in short axis on series  2, image 21, unchanged compared to prior exam. Right retrosternal/epicardial lymph node measuring 1.1 cm in short axis on series 2, image 30, unchanged in size compared to prior exam. Other smaller mediastinal lymph nodes are also stable in size. Lungs/Pleura: Central airways are patent. Unchanged left-sided volume loss. Small left pleural effusion is unchanged in size when compared to prior exam. Juxtapleural linear opacities which are likely due to scarring atelectasis, also unchanged compared to prior. Stable small solid pulmonary nodules. Reference solid nodule of the right upper lobe measuring 3 mm on series 4, image 40. No new or enlarging pulmonary nodules. Musculoskeletal: No aggressive appearing osseous lesions. CT ABDOMEN PELVIS FINDINGS Hepatobiliary: Small low-attenuation lesion of the right lobe of the liver seen on series 2, image 51, unchanged compared to prior exam and possibly a simple cyst. No suspicious liver lesions. Normal appearance of the gallbladder. No biliary ductal dilation. Pancreas: Unremarkable. No pancreatic ductal dilatation or surrounding inflammatory changes. Spleen: Normal in size without focal abnormality. Adrenals/Urinary Tract: Bilateral adrenal glands are unremarkable. Left kidney is surgically absent. Normal appearance of the right kidney with no evidence of hydronephrosis or nephrolithiasis. Bladder is decompressed. Stomach/Bowel: Small hiatal hernia. Normal appearance of the appendix. No bowel wall thickening, inflammatory change or evidence of obstruction. Vascular/Lymphatic: Prominent subcentimeter retroperitoneal lymph nodes are seen in the left nephrectomy bed, unchanged compared to prior exam. No pathologically enlarged lymph nodes seen in the abdomen or pelvis. Atherosclerotic disease of the abdominal aorta. Reproductive: Uterus and bilateral adnexa are unremarkable. Other: No abdominal wall hernia or abnormality. No abdominopelvic ascites. Musculoskeletal: No acute or  significant osseous findings. IMPRESSION: 1. Stable posttreatment changes of the left hemithorax. 2. Unchanged size of mildly enlarged right axillary and retrosternal/epicardial lymph nodes. 3. No evidence of metastatic disease in the abdomen or pelvis. 4.  Aortic Atherosclerosis (ICD10-I70.0). Electronically Signed   By: LYetta GlassmanM.D.   On: 02/16/2021 15:48     ASSESSMENT/PLAN:  This is a very pleasant 65year old Caucasian female diagnosed with stage IV (T3, N0, M1A) non-small cell lung cancer, adenocarcinoma.  She presented with a large left lower lobe lung mass in addition to another lingular mass and a malignant left pleural effusion.  She was diagnosed in September 2021.  Her molecular studies show that she has a PD-L1 expression at 95%.  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   The patient is currently undergoing treatment with single agent Libtayo (Cempilimab) 350 mg IV every 3 weeks for up to 2 years unless the patient has evidence for disease progression or unacceptable toxicity.  She is status post 20 cycles and tolerated it well without any concerning adverse side effects.     Labs were reviewed.  I reviewed the patient's symptoms with Dr. MJulien Nordmann  Interestingly, the patient's knee swelling occurred after her recent Salmonella infection which has improved at this time.  Unclear if this is reactive arthritis.  Encouraged her to follow-up with her primary  care provider.  While we do not recommend frequent and long-term NSAIDs, she does have adequate kidney function if she needed to take NSAIDs in the short-term.  Regarding the calf swelling/pain, we will arrange for a lower extremity Doppler ultrasound to rule out DVT.    Recommend that she proceed with cycle #21 today scheduled.  We will see her back for follow-up visit in 3 weeks for evaluation before starting cycle #22.   She will continue to use hydrocortisone cream if  needed for her rash and Benadryl if needed.  The patient was advised to call immediately if she has any concerning symptoms in the interval. The patient voices understanding of current disease status and treatment options and is in agreement with the current care plan. All questions were answered. The patient knows to call the clinic with any problems, questions or concerns. We can certainly see the patient much sooner if necessary      No orders of the defined types were placed in this encounter.    The total time spent in the appointment was 20-25 minutes.   Gabrielle Gunnels L Johnell Bas, PA-C 03/11/21

## 2021-03-07 ENCOUNTER — Other Ambulatory Visit: Payer: Self-pay

## 2021-03-07 DIAGNOSIS — C3492 Malignant neoplasm of unspecified part of left bronchus or lung: Secondary | ICD-10-CM

## 2021-03-11 ENCOUNTER — Inpatient Hospital Stay: Payer: Medicare Other

## 2021-03-11 ENCOUNTER — Inpatient Hospital Stay (HOSPITAL_BASED_OUTPATIENT_CLINIC_OR_DEPARTMENT_OTHER): Payer: Medicare Other | Admitting: Physician Assistant

## 2021-03-11 ENCOUNTER — Inpatient Hospital Stay: Payer: Medicare Other | Attending: Internal Medicine

## 2021-03-11 ENCOUNTER — Other Ambulatory Visit: Payer: Self-pay

## 2021-03-11 VITALS — BP 127/78 | HR 62 | Temp 97.7°F | Resp 16 | Ht 64.0 in | Wt 139.9 lb

## 2021-03-11 DIAGNOSIS — C3432 Malignant neoplasm of lower lobe, left bronchus or lung: Secondary | ICD-10-CM | POA: Insufficient documentation

## 2021-03-11 DIAGNOSIS — C3492 Malignant neoplasm of unspecified part of left bronchus or lung: Secondary | ICD-10-CM

## 2021-03-11 DIAGNOSIS — Z7984 Long term (current) use of oral hypoglycemic drugs: Secondary | ICD-10-CM | POA: Insufficient documentation

## 2021-03-11 DIAGNOSIS — Z5112 Encounter for antineoplastic immunotherapy: Secondary | ICD-10-CM

## 2021-03-11 DIAGNOSIS — J910 Malignant pleural effusion: Secondary | ICD-10-CM | POA: Diagnosis not present

## 2021-03-11 DIAGNOSIS — M79661 Pain in right lower leg: Secondary | ICD-10-CM

## 2021-03-11 DIAGNOSIS — Z79899 Other long term (current) drug therapy: Secondary | ICD-10-CM | POA: Diagnosis not present

## 2021-03-11 DIAGNOSIS — Z905 Acquired absence of kidney: Secondary | ICD-10-CM | POA: Insufficient documentation

## 2021-03-11 LAB — CBC WITH DIFFERENTIAL (CANCER CENTER ONLY)
Abs Immature Granulocytes: 0.02 10*3/uL (ref 0.00–0.07)
Basophils Absolute: 0.1 10*3/uL (ref 0.0–0.1)
Basophils Relative: 1 %
Eosinophils Absolute: 0.4 10*3/uL (ref 0.0–0.5)
Eosinophils Relative: 7 %
HCT: 35.1 % — ABNORMAL LOW (ref 36.0–46.0)
Hemoglobin: 11.1 g/dL — ABNORMAL LOW (ref 12.0–15.0)
Immature Granulocytes: 0 %
Lymphocytes Relative: 19 %
Lymphs Abs: 1.1 10*3/uL (ref 0.7–4.0)
MCH: 27.5 pg (ref 26.0–34.0)
MCHC: 31.6 g/dL (ref 30.0–36.0)
MCV: 86.9 fL (ref 80.0–100.0)
Monocytes Absolute: 0.4 10*3/uL (ref 0.1–1.0)
Monocytes Relative: 6 %
Neutro Abs: 3.7 10*3/uL (ref 1.7–7.7)
Neutrophils Relative %: 67 %
Platelet Count: 323 10*3/uL (ref 150–400)
RBC: 4.04 MIL/uL (ref 3.87–5.11)
RDW: 14.6 % (ref 11.5–15.5)
WBC Count: 5.6 10*3/uL (ref 4.0–10.5)
nRBC: 0 % (ref 0.0–0.2)

## 2021-03-11 LAB — CMP (CANCER CENTER ONLY)
ALT: 16 U/L (ref 0–44)
AST: 15 U/L (ref 15–41)
Albumin: 3.8 g/dL (ref 3.5–5.0)
Alkaline Phosphatase: 61 U/L (ref 38–126)
Anion gap: 8 (ref 5–15)
BUN: 23 mg/dL (ref 8–23)
CO2: 23 mmol/L (ref 22–32)
Calcium: 9.6 mg/dL (ref 8.9–10.3)
Chloride: 105 mmol/L (ref 98–111)
Creatinine: 0.77 mg/dL (ref 0.44–1.00)
GFR, Estimated: >60 mL/min (ref >60)
Glucose, Bld: 88 mg/dL (ref 70–99)
Potassium: 3.9 mmol/L (ref 3.5–5.1)
Sodium: 136 mmol/L (ref 135–145)
Total Bilirubin: 0.3 mg/dL (ref 0.3–1.2)
Total Protein: 7.3 g/dL (ref 6.5–8.1)

## 2021-03-11 MED ORDER — SODIUM CHLORIDE 0.9 % IV SOLN: Freq: Once | INTRAVENOUS | Status: AC

## 2021-03-11 MED ORDER — SODIUM CHLORIDE 0.9 % IV SOLN
350.0000 mg | Freq: Once | INTRAVENOUS | Status: AC
  Administered 2021-03-11: 350 mg via INTRAVENOUS
  Filled 2021-03-11: qty 7

## 2021-03-11 NOTE — Patient Instructions (Signed)
[facility]  Discharge Instructions: Thank you for choosing [facility] to provide your oncology and hematology care.   If you have a lab appointment with the [facility], please go directly to the [facility] and check in at the registration area.   Wear comfortable clothing and clothing appropriate for easy access to any Portacath or PICC line.   We strive to give you quality time with your provider. You may need to reschedule your appointment if you arrive late (15 or more minutes).  Arriving late affects you and other patients whose appointments are after yours.  Also, if you miss three or more appointments without notifying the office, you may be dismissed from the clinic at the providers discretion.      For prescription refill requests, have your pharmacy contact our office and allow 72 hours for refills to be completed.    Today you received the following chemotherapy and/or immunotherapy agents Libtayo      To help prevent nausea and vomiting after your treatment, we encourage you to take your nausea medication as directed.  BELOW ARE SYMPTOMS THAT SHOULD BE REPORTED IMMEDIATELY: *FEVER GREATER THAN 100.4 F (38 C) OR HIGHER *CHILLS OR SWEATING *NAUSEA AND VOMITING THAT IS NOT CONTROLLED WITH YOUR NAUSEA MEDICATION *UNUSUAL SHORTNESS OF BREATH *UNUSUAL BRUISING OR BLEEDING *URINARY PROBLEMS (pain or burning when urinating, or frequent urination) *BOWEL PROBLEMS (unusual diarrhea, constipation, pain near the anus) TENDERNESS IN MOUTH AND THROAT WITH OR WITHOUT PRESENCE OF ULCERS (sore throat, sores in mouth, or a toothache) UNUSUAL RASH, SWELLING OR PAIN  UNUSUAL VAGINAL DISCHARGE OR ITCHING   Items with * indicate a potential emergency and should be followed up as soon as possible or go to the Emergency Department if any problems should occur.  Please show the CHEMOTHERAPY ALERT CARD or IMMUNOTHERAPY ALERT CARD at check-in to the  Emergency Department and triage nurse.  Should you have questions after your visit or need to cancel or reschedule your appointment, please contact [facility]  Dept: [phone]  and follow the prompts.  Office hours are 8:00 a.m. to 4:30 p.m. Monday - Friday. Please note that voicemails left after 4:00 p.m. may not be returned until the following business day.  We are closed weekends and major holidays. You have access to a nurse at all times for urgent questions. Please call the main number to the clinic Dept: [phone] and follow the prompts.   For any non-urgent questions, you may also contact your provider using MyChart. We now offer e-Visits for anyone 5 and older to request care online for non-urgent symptoms. For details visit [email or web address].   Also download the MyChart app! Go to the app store, search "MyChart", open the app, select , and log in with your MyChart username and password.  Due to Covid, a mask is required upon entering the hospital/clinic. If you do not have a mask, one will be given to you upon arrival. For doctor visits, patients may have 1 support person aged 10 or older with them. For treatment visits, patients cannot have anyone with them due to current Covid guidelines and our immunocompromised population.

## 2021-03-13 ENCOUNTER — Ambulatory Visit (HOSPITAL_COMMUNITY)
Admission: RE | Admit: 2021-03-13 | Discharge: 2021-03-13 | Disposition: A | Discharge status: Home / Self Care | Payer: Medicare Other | Source: Ambulatory Visit | Attending: Cardiology | Admitting: Cardiology

## 2021-03-13 ENCOUNTER — Telehealth: Payer: Self-pay | Admitting: Physician Assistant

## 2021-03-13 ENCOUNTER — Other Ambulatory Visit: Payer: Self-pay

## 2021-03-13 DIAGNOSIS — M79661 Pain in right lower leg: Secondary | ICD-10-CM | POA: Insufficient documentation

## 2021-03-13 DIAGNOSIS — C3492 Malignant neoplasm of unspecified part of left bronchus or lung: Secondary | ICD-10-CM | POA: Diagnosis present

## 2021-03-13 NOTE — Telephone Encounter (Signed)
I reviewed the patient's ultrasound with Dr. Julien Nordmann. No DVT noted. She had some fluid in the right anterolateral knee. Recommend the patient follow up with her PCP but reassured her no DVT. I passed the information to the patient's husband.

## 2021-04-01 ENCOUNTER — Encounter: Payer: Self-pay | Admitting: Internal Medicine

## 2021-04-01 ENCOUNTER — Inpatient Hospital Stay: Payer: Medicare Other

## 2021-04-01 ENCOUNTER — Inpatient Hospital Stay (HOSPITAL_BASED_OUTPATIENT_CLINIC_OR_DEPARTMENT_OTHER): Payer: Medicare Other | Admitting: Internal Medicine

## 2021-04-01 ENCOUNTER — Other Ambulatory Visit: Payer: Self-pay

## 2021-04-01 VITALS — BP 118/68 | HR 55 | Temp 97.8°F | Resp 17 | Ht 64.0 in | Wt 135.7 lb

## 2021-04-01 DIAGNOSIS — C3492 Malignant neoplasm of unspecified part of left bronchus or lung: Secondary | ICD-10-CM | POA: Diagnosis not present

## 2021-04-01 DIAGNOSIS — Z5112 Encounter for antineoplastic immunotherapy: Secondary | ICD-10-CM

## 2021-04-01 LAB — CBC WITH DIFFERENTIAL (CANCER CENTER ONLY)
Abs Immature Granulocytes: 0.03 10*3/uL (ref 0.00–0.07)
Basophils Absolute: 0.1 10*3/uL (ref 0.0–0.1)
Basophils Relative: 1 %
Eosinophils Absolute: 0.3 10*3/uL (ref 0.0–0.5)
Eosinophils Relative: 6 %
HCT: 33.8 % — ABNORMAL LOW (ref 36.0–46.0)
Hemoglobin: 10.4 g/dL — ABNORMAL LOW (ref 12.0–15.0)
Immature Granulocytes: 1 %
Lymphocytes Relative: 23 %
Lymphs Abs: 0.9 10*3/uL (ref 0.7–4.0)
MCH: 26.6 pg (ref 26.0–34.0)
MCHC: 30.8 g/dL (ref 30.0–36.0)
MCV: 86.4 fL (ref 80.0–100.0)
Monocytes Absolute: 0.2 10*3/uL (ref 0.1–1.0)
Monocytes Relative: 6 %
Neutro Abs: 2.5 10*3/uL (ref 1.7–7.7)
Neutrophils Relative %: 63 %
Platelet Count: 266 10*3/uL (ref 150–400)
RBC: 3.91 MIL/uL (ref 3.87–5.11)
RDW: 14.4 % (ref 11.5–15.5)
WBC Count: 4 10*3/uL (ref 4.0–10.5)
nRBC: 0 % (ref 0.0–0.2)

## 2021-04-01 LAB — CMP (CANCER CENTER ONLY)
ALT: 18 U/L (ref 0–44)
AST: 17 U/L (ref 15–41)
Albumin: 3.5 g/dL (ref 3.5–5.0)
Alkaline Phosphatase: 58 U/L (ref 38–126)
Anion gap: 7 (ref 5–15)
BUN: 16 mg/dL (ref 8–23)
CO2: 24 mmol/L (ref 22–32)
Calcium: 9.1 mg/dL (ref 8.9–10.3)
Chloride: 107 mmol/L (ref 98–111)
Creatinine: 0.85 mg/dL (ref 0.44–1.00)
GFR, Estimated: >60 mL/min (ref >60)
Glucose, Bld: 169 mg/dL — ABNORMAL HIGH (ref 70–99)
Potassium: 4.3 mmol/L (ref 3.5–5.1)
Sodium: 138 mmol/L (ref 135–145)
Total Bilirubin: 0.3 mg/dL (ref 0.3–1.2)
Total Protein: 6.6 g/dL (ref 6.5–8.1)

## 2021-04-01 MED ORDER — SODIUM CHLORIDE 0.9 % IV SOLN
350.0000 mg | Freq: Once | INTRAVENOUS | Status: AC
  Administered 2021-04-01: 13:00:00 350 mg via INTRAVENOUS
  Filled 2021-04-01: qty 7

## 2021-04-01 MED ORDER — SODIUM CHLORIDE 0.9 % IV SOLN: Freq: Once | INTRAVENOUS | Status: AC

## 2021-04-01 NOTE — Patient Instructions (Signed)
[facility]  Discharge Instructions: Thank you for choosing [facility] to provide your oncology and hematology care.   If you have a lab appointment with the [facility], please go directly to the [facility] and check in at the registration area.   Wear comfortable clothing and clothing appropriate for easy access to any Portacath or PICC line.   We strive to give you quality time with your provider. You may need to reschedule your appointment if you arrive late (15 or more minutes).  Arriving late affects you and other patients whose appointments are after yours.  Also, if you miss three or more appointments without notifying the office, you may be dismissed from the clinic at the providers discretion.      For prescription refill requests, have your pharmacy contact our office and allow 72 hours for refills to be completed.    Today you received the following chemotherapy and/or immunotherapy agents Libtayo      To help prevent nausea and vomiting after your treatment, we encourage you to take your nausea medication as directed.  BELOW ARE SYMPTOMS THAT SHOULD BE REPORTED IMMEDIATELY: *FEVER GREATER THAN 100.4 F (38 C) OR HIGHER *CHILLS OR SWEATING *NAUSEA AND VOMITING THAT IS NOT CONTROLLED WITH YOUR NAUSEA MEDICATION *UNUSUAL SHORTNESS OF BREATH *UNUSUAL BRUISING OR BLEEDING *URINARY PROBLEMS (pain or burning when urinating, or frequent urination) *BOWEL PROBLEMS (unusual diarrhea, constipation, pain near the anus) TENDERNESS IN MOUTH AND THROAT WITH OR WITHOUT PRESENCE OF ULCERS (sore throat, sores in mouth, or a toothache) UNUSUAL RASH, SWELLING OR PAIN  UNUSUAL VAGINAL DISCHARGE OR ITCHING   Items with * indicate a potential emergency and should be followed up as soon as possible or go to the Emergency Department if any problems should occur.  Please show the CHEMOTHERAPY ALERT CARD or IMMUNOTHERAPY ALERT CARD at check-in to the  Emergency Department and triage nurse.  Should you have questions after your visit or need to cancel or reschedule your appointment, please contact [facility]  Dept: [phone]  and follow the prompts.  Office hours are 8:00 a.m. to 4:30 p.m. Monday - Friday. Please note that voicemails left after 4:00 p.m. may not be returned until the following business day.  We are closed weekends and major holidays. You have access to a nurse at all times for urgent questions. Please call the main number to the clinic Dept: [phone] and follow the prompts.   For any non-urgent questions, you may also contact your provider using MyChart. We now offer e-Visits for anyone 42 and older to request care online for non-urgent symptoms. For details visit [email or web address].   Also download the MyChart app! Go to the app store, search "MyChart", open the app, select [facility], and log in with your MyChart username and password.  Due to Covid, a mask is required upon entering the hospital/clinic. If you do not have a mask, one will be given to you upon arrival. For doctor visits, patients may have 1 support person aged 16 or older with them. For treatment visits, patients cannot have anyone with them due to current Covid guidelines and our immunocompromised population.

## 2021-04-01 NOTE — Progress Notes (Signed)
[facility] [phone]   [phone]  OFFICE PROGRESS NOTE  Gabrielle Dandy, NP [place] [address]  DIAGNOSIS: stage IV (T3, N0, M1 a) non-small cell lung cancer, adenocarcinoma presented with large left lower lobe lung mass in addition to another lingual mass and malignant left pleural effusion diagnosed in September 2021  Biomarker Findings Tumor Mutational Burden - 13 Muts/Mb Microsatellite status - MS-Stable Genomic Findings For a complete list of the genes assayed, please refer to the Appendix. KRAS G12C PRDM1 D248f*46 TERT promoter -146C>T TP53 V157F 7 Disease relevant genes with no reportable alterations: ALK, BRAF, EGFR, ERBB2, MET, RET, ROS1   PDL1 Expression: 95%  PRIOR THERAPY: None.  CURRENT THERAPY: Libtayo (Cempilimab) 350 mg IV every 3 weeks.  First dose January 15, 2020.  Status post 21 cycles.  INTERVAL HISTORY: Gabrielle HUXFORD66y.o. female returns to the clinic today for follow-up visit accompanied by her husband.  The patient is feeling fine today with no concerning complaints.  She denied having any current chest pain, shortness of breath, cough or hemoptysis.  She has no nausea, vomiting, diarrhea or constipation.  She has no headache or visual changes.  She has no fever or chills.  She lost few pounds since her last visit.  She is here today for evaluation before starting cycle #22.    MEDICAL HISTORY: Past Medical History:  Diagnosis Date   Hyperlipidemia    Hypertension    Pleural effusion    Pneumonia    Stage IV adenocarcinoma of lung, left (HCC)     ALLERGIES:  is allergic to ace inhibitors.  MEDICATIONS:  Current Outpatient Medications  Medication Sig Dispense Refill   cholecalciferol (VITAMIN D3) 25 MCG (1000 UNIT) tablet Take 2,000 Units by mouth daily.     levothyroxine (SYNTHROID) 50 MCG tablet TAKE 1 TABLET BY MOUTH EVERY DAY BEFORE BREAKFAST 90 tablet 0    losartan-hydrochlorothiazide (HYZAAR) 100-12.5 MG tablet Take 1 tablet by mouth daily. (Patient not taking: Reported on 01/28/2021)     metFORMIN (GLUCOPHAGE) 500 MG tablet Take 500 mg by mouth 2 (two) times daily with a meal.     pravastatin (PRAVACHOL) 40 MG tablet Take 40 mg by mouth daily.     prochlorperazine (COMPAZINE) 10 MG tablet Take 1 tablet (10 mg total) by mouth every 6 (six) hours as needed for nausea or vomiting. (Patient not taking: Reported on 01/28/2021) 30 tablet 0   sertraline (ZOLOFT) 50 MG tablet Take 75 mg by mouth daily.     No current facility-administered medications for this visit.    SURGICAL HISTORY:  Past Surgical History:  Procedure Laterality Date   NEPHRECTOMY Left     REVIEW OF SYSTEMS:  A comprehensive review of systems was negative.   PHYSICAL EXAMINATION: General appearance: alert, cooperative, and no distress Head: Normocephalic, without obvious abnormality, atraumatic Neck: no adenopathy, no JVD, supple, symmetrical, trachea midline, and thyroid not enlarged, symmetric, no tenderness/mass/nodules Lymph nodes: Cervical, supraclavicular, and axillary nodes normal. Resp: clear to auscultation bilaterally Back: symmetric, no curvature. ROM normal. No CVA tenderness. Cardio: regular rate and rhythm, S1, S2 normal, no murmur, click, rub or gallop GI: soft, non-tender; bowel sounds normal; no masses,  no organomegaly Extremities: extremities normal, atraumatic, no cyanosis or edema  ECOG PERFORMANCE STATUS: 1 - Symptomatic but completely ambulatory  Blood pressure 118/68, pulse (!) 55, temperature 97.8 F (36.6 C), temperature source Tympanic, resp. rate  17, height _0  (1.626 m), weight 135 lb 11.2 oz (61.6 kg), SpO2 100 %.  LABORATORY DATA: Lab Results  Component Value Date   WBC 4.0 04/01/2021   HGB 10.4 (L) 04/01/2021   HCT 33.8 (L) 04/01/2021   MCV 86.4 04/01/2021   PLT 266 04/01/2021      Chemistry      Component Value Date/Time   NA  136 03/11/2021 1356   K 3.9 03/11/2021 1356   CL 105 03/11/2021 1356   CO2 23 03/11/2021 1356   BUN 23 03/11/2021 1356   CREATININE 0.77 03/11/2021 1356      Component Value Date/Time   CALCIUM 9.6 03/11/2021 1356   ALKPHOS 61 03/11/2021 1356   AST 15 03/11/2021 1356   ALT 16 03/11/2021 1356   BILITOT 0.3 03/11/2021 1356       RADIOGRAPHIC STUDIES: VAS [place] LOWER EXTREMITY VENOUS (DVT)  Result Date: 03/16/2021  Lower Venous DVT Study Patient Name:  Gabrielle [name]  Date of Exam:   03/13/2021 Medical Rec #: [id number]         Accession #:    [id number] Date of Birth: 04-30-1955        Patient Gender: F Patient Age:   66 years Exam Location:  [facility] Procedure:      VAS [place] LOWER EXTREMITY VENOUS (DVT) Referring Phys: Roosevelt Locks --------------------------------------------------------------------------------  Indications: Patient has a history of lung cancer and is currently under immunotherapy. She reports pain and swelling in the right knee x 1 month. The longer she is on her legs the more the knee swells. She denies any SOB.  Risk Factors: Cancer of the lungs. Comparison Study: NA Performing Technologist: Leavy Cella RDCS Supporting Technologist: Sharlett Iles RVT  Examination Guidelines: A complete evaluation includes B-mode imaging, spectral Doppler, color Doppler, and power Doppler as needed of all accessible portions of each vessel. Bilateral testing is considered an integral part of a complete examination. Limited examinations for reoccurring indications may be performed as noted. The reflux portion of the exam is performed with the patient in reverse Trendelenburg.  +---------+---------------+---------+-----------+----------+--------------+  RIGHT     Compressibility Phasicity Spontaneity Properties Thrombus Aging  +---------+---------------+---------+-----------+----------+--------------+  CFV       Full            Yes       Yes                                     +---------+---------------+---------+-----------+----------+--------------+  SFJ       Full            Yes       Yes                                    +---------+---------------+---------+-----------+----------+--------------+  FV Prox   Full            Yes       Yes                                    +---------+---------------+---------+-----------+----------+--------------+  FV Mid    Full            Yes       Yes                                    +---------+---------------+---------+-----------+----------+--------------+  FV Distal Full            Yes       Yes                                    +---------+---------------+---------+-----------+----------+--------------+  PFV       Full            Yes       Yes                                    +---------+---------------+---------+-----------+----------+--------------+  POP       Full            Yes       Yes                                    +---------+---------------+---------+-----------+----------+--------------+  PTV       Full                      No                                     +---------+---------------+---------+-----------+----------+--------------+  PERO      Full                      No                                     +---------+---------------+---------+-----------+----------+--------------+  Gastroc   Full                                                             +---------+---------------+---------+-----------+----------+--------------+  GSV       Full            Yes       Yes                                    +---------+---------------+---------+-----------+----------+--------------+   Right Technical Findings: Rouleau flow noted in the saphenofemoral junction, proximal great saphenous vein, deep femoral vein, popliteal vein, gastrocnemius vein and TPT. Fluid noted at the anterolateral knee.  +---------+---------------+---------+-----------+----------+--------------+  LEFT       Compressibility Phasicity Spontaneity Properties Thrombus Aging  +---------+---------------+---------+-----------+----------+--------------+  CFV       Full            Yes       Yes                                    +---------+---------------+---------+-----------+----------+--------------+  SFJ       Full            Yes       Yes                                    +---------+---------------+---------+-----------+----------+--------------+  FV Prox   Full            Yes       Yes                                    +---------+---------------+---------+-----------+----------+--------------+  FV Mid    Full            Yes       Yes                                    +---------+---------------+---------+-----------+----------+--------------+  FV Distal Full            Yes       Yes                                    +---------+---------------+---------+-----------+----------+--------------+  PFV       Full            Yes       Yes                                    +---------+---------------+---------+-----------+----------+--------------+  POP       Full            Yes       Yes                                    +---------+---------------+---------+-----------+----------+--------------+  PTV       Full                      No                                     +---------+---------------+---------+-----------+----------+--------------+  PERO      Full                      No                                     +---------+---------------+---------+-----------+----------+--------------+  Gastroc   Full                                                             +---------+---------------+---------+-----------+----------+--------------+  GSV       Full            Yes       Yes                                    +---------+---------------+---------+-----------+----------+--------------+     Summary: BILATERAL: - No evidence of deep vein thrombosis seen in the lower extremities, bilaterally. - No evidence of superficial  venous  thrombosis in the lower extremities, bilaterally. -No evidence of popliteal cyst, bilaterally.  RIGHT: - Rouleaux/slow echogenic flow noted in the saphenofemoral junction, proximal great saphenous vein, deep femoral vein and popliteal vein. Fluid noted at the anterolateral knee.   *See table(s) above for measurements and observations. Electronically signed by Carlyle Dolly MD on 03/16/2021 at 7:31:29 PM.    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  The molecular studies showed positive PD-L1 expression of 95%.  The patient also has K-ras G12C mutation which will be an option for the second line treatment. With the high PD-L1 expression I discussed with the patient treatment with single agent Libtayo (Cempilimab) 350 mg IV every 3 weeks for up to 2 years unless the patient has evidence for disease progression or unacceptable toxicity. The patient started the first cycle of this treatment on January 15, 2020. She is status post 21 cycles.  The patient has been tolerating this treatment well with no concerning adverse effects. I recommended for her to proceed with cycle #22 today as planned. I will see her back for follow-up visit in 3 weeks for evaluation before the next cycle of her treatment. The patient was advised to call immediately if she has any other concerning symptoms in the interval. The patient voices understanding of current disease status and treatment options and is in agreement with the current care plan.  All questions were answered. The patient knows to call the clinic with any problems, questions or concerns. We can certainly see the patient much sooner if necessary.   Disclaimer: This note was  dictated with voice recognition software. Similar sounding words can inadvertently be transcribed and may not be corrected upon review.

## 2021-04-09 ENCOUNTER — Telehealth: Payer: Self-pay | Admitting: Internal Medicine

## 2021-04-09 NOTE — Telephone Encounter (Signed)
Scheduled per 1/24 los, patient has been called and voicemail was left regarding upcoming appointments.

## 2021-04-18 NOTE — Progress Notes (Signed)
[facility] OFFICE PROGRESS NOTE  [name], NP [address] [place] [address]  DIAGNOSIS: Stage IV (T3, N0, M1 a) non-small cell lung cancer, adenocarcinoma presented with large left lower lobe lung mass in addition to another lingual mass and malignant left pleural effusion diagnosed in September 2021   Biomarker Findings Tumor Mutational Burden - 13 Muts/Mb Microsatellite status - MS-Stable Genomic Findings For a complete list of the genes assayed, please refer to the Appendix. KRAS G12C PRDM1 D232f*46 TERT promoter -146C>T TP53 V157F 7 Disease relevant genes with no reportable alterations: ALK, BRAF, EGFR, ERBB2, MET, RET, ROS1   PDL1 Expression: 95%    PRIOR THERAPY: None  CURRENT THERAPY:  Libtayo (Cempilimab) 350 mg IV every 3 weeks.  First dose January 15, 2020.  Status post 22 cycles  [name] 66y.o. female returns to the clinic for a follow up visit accompanied by her husband. The patient is feeling well today without any concerning complaints. The patient continues to tolerate treatment with immunotherapy with Libtayo well without any adverse side effects except she does have some scattered skin lesions which are consistent with immunotherapy mediated although these are getting better recently and she denies any recent itching. She also had previously had intermittent diarrhea which fortunately, has improved lately.  Of note, the patient was found to have salmonella in November 2022.    Denies any fever, chills, or night sweats.  Her weight is stable. Denies any chest pain, cough, or hemoptysis. She denies any significant shortness of breath. She has had a stable pleural effusion which she is not very symptomatic from. Denies any nausea, vomiting, or constipation. Denies any headache or visual changes. She recently had some fluid removed from her right knee due to an effusion. This was reportedly not infectious  etiology. The patient is here today for evaluation prior to starting cycle # 23  MEDICAL HISTORY: Past Medical History:  Diagnosis Date   Hyperlipidemia    Hypertension    Pleural effusion    Pneumonia    Stage IV adenocarcinoma of lung, left (HCC)     ALLERGIES:  is allergic to ace inhibitors.  MEDICATIONS:  Current Outpatient Medications  Medication Sig Dispense Refill   cholecalciferol (VITAMIN D3) 25 MCG (1000 UNIT) tablet Take 2,000 Units by mouth daily.     levothyroxine (SYNTHROID) 50 MCG tablet TAKE 1 TABLET BY MOUTH EVERY DAY BEFORE BREAKFAST 90 tablet 0   metFORMIN (GLUCOPHAGE) 500 MG tablet Take 500 mg by mouth 2 (two) times daily with a meal.     pravastatin (PRAVACHOL) 40 MG tablet Take 40 mg by mouth daily.     prochlorperazine (COMPAZINE) 10 MG tablet Take 1 tablet (10 mg total) by mouth every 6 (six) hours as needed for nausea or vomiting. 30 tablet 0   sertraline (ZOLOFT) 50 MG tablet Take 75 mg by mouth daily.     losartan-hydrochlorothiazide (HYZAAR) 100-12.5 MG tablet Take 1 tablet by mouth daily. (Patient not taking: Reported on 04/22/2021)     No current facility-administered medications for this visit.    SURGICAL HISTORY:  Past Surgical History:  Procedure Laterality Date   NEPHRECTOMY Left     REVIEW OF SYSTEMS:   Review of Systems  Constitutional: Negative for appetite change, chills, fatigue, fever and unexpected weight change.  HENT:   Negative for mouth sores, nosebleeds, sore throat and trouble swallowing.   Eyes: Negative for eye problems and icterus.  Respiratory: Negative for cough, hemoptysis, shortness of breath and wheezing.   Cardiovascular: Negative for chest pain and leg swelling.  Gastrointestinal: Negative for abdominal pain, constipation, diarrhea, nausea and vomiting.  Genitourinary: Negative for bladder incontinence, difficulty urinating, dysuria, frequency and hematuria.   Musculoskeletal: Negative for back pain, gait problem,  neck pain and neck stiffness.  Skin: Negative for itching and rash.  Neurological: Negative for dizziness, extremity weakness, gait problem, headaches, light-headedness and seizures.  Hematological: Negative for adenopathy. Does not bruise/bleed easily.  Psychiatric/Behavioral: Negative for confusion, depression and sleep disturbance. The patient is not nervous/anxious.     PHYSICAL EXAMINATION:  Blood pressure 124/62, pulse 65, temperature 97.8 F (36.6 C), temperature source Oral, resp. rate 17, height 5' 4" (1.626 m), weight 137 lb (62.1 kg), SpO2 100 %.  ECOG PERFORMANCE STATUS: 1  Physical Exam  Constitutional: Oriented to person, place, and time and well-developed, well-nourished, and in no distress.  HENT:  Head: Normocephalic and atraumatic.  Mouth/Throat: Oropharynx is clear and moist. No oropharyngeal exudate.  Eyes: Conjunctivae are normal. Right eye exhibits no discharge. Left eye exhibits no discharge. No scleral icterus.  Neck: Normal range of motion. Neck supple.  Cardiovascular: Normal rate, regular rhythm, normal heart sounds and intact distal pulses.   Pulmonary/Chest: Effort normal and breath sounds normal. No respiratory distress. No wheezes. No rales.  Abdominal: Soft. Bowel sounds are normal. Exhibits no distension and no mass. There is no tenderness.  Musculoskeletal: Normal range of motion. Exhibits no edema.  Lymphadenopathy:    No cervical adenopathy.  Neurological: Alert and oriented to person, place, and time. Exhibits normal muscle tone. Gait normal. Coordination normal.  Skin: Skin is warm and dry. No rash noted. Not diaphoretic. No erythema. No pallor.  Psychiatric: Mood, memory and judgment normal.  Vitals reviewed.  LABORATORY DATA: Lab Results  Component Value Date   WBC 4.4 04/22/2021   HGB 11.6 (L) 04/22/2021   HCT 37.6 04/22/2021   MCV 87.6 04/22/2021   PLT 221 04/22/2021      Chemistry      Component Value Date/Time   NA 138 04/01/2021  1024   K 4.3 04/01/2021 1024   CL 107 04/01/2021 1024   CO2 24 04/01/2021 1024   BUN 16 04/01/2021 1024   CREATININE 0.85 04/01/2021 1024      Component Value Date/Time   CALCIUM 9.1 04/01/2021 1024   ALKPHOS 58 04/01/2021 1024   AST 17 04/01/2021 1024   ALT 18 04/01/2021 1024   BILITOT 0.3 04/01/2021 1024       RADIOGRAPHIC STUDIES:  No results found.   ASSESSMENT/PLAN:  This is a very pleasant 66 year old Caucasian female diagnosed with stage IV (T3, N0, M1A) non-small cell lung cancer, adenocarcinoma.  She presented with a large left lower lobe lung mass in addition to another lingular mass and a malignant left pleural effusion.  She was diagnosed in September 2021.  Her molecular studies show that she has a PD-L1 expression at 95%.  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   The patient is currently undergoing treatment with single agent Libtayo (Cempilimab) 350 mg IV every 3 weeks for up to 2 years unless the patient has evidence for disease progression or unacceptable toxicity.  She is status post 22 cycles and tolerated it well without any concerning adverse side effects.   Labs were reviewed. Her CMP is still pending. Recommend that she proceed with  cycle #23 today scheduled.   We will see her back for follow-up visit in 3 weeks for evaluation before starting cycle #24.  I will arrange for restaging CT scan the chest, abdomen, and pelvis prior to starting her next cycle of treatment. She is going to be out of town from 3/1-3/6. We will arrange for her scan to performed on 2/28.    The patient was advised to call immediately if she has any concerning symptoms in the interval. The patient voices understanding of current disease status and treatment options and is in agreement with the current care plan. All questions were answered. The patient knows to call the clinic with any problems, questions or concerns. We can  certainly see the patient much sooner if necessary      Orders Placed This Encounter  Procedures   CT Chest W Contrast    Standing Status:   Future    Standing Expiration Date:   04/22/2022    Order Specific Question:   If indicated for the ordered procedure, I authorize the administration of contrast media per Radiology protocol    Answer:   Yes    Order Specific Question:   Preferred imaging location?    Answer:   [facility]   CT Abdomen Pelvis W Contrast    Standing Status:   Future    Standing Expiration Date:   04/22/2022    Order Specific Question:   If indicated for the ordered procedure, I authorize the administration of contrast media per Radiology protocol    Answer:   Yes    Order Specific Question:   Preferred imaging location?    Answer:   [facility]    Order Specific Question:   Is Oral Contrast requested for this exam?    Answer:   Yes, Per Radiology protocol   CBC with Differential ([facility] Only)    Standing Status:   Standing    Number of Occurrences:   20    Standing Expiration Date:   04/22/2022   CMP ([facility] only)    Standing Status:   Standing    Number of Occurrences:   20    Standing Expiration Date:   04/22/2022   TSH    Standing Status:   Standing    Number of Occurrences:   20    Standing Expiration Date:   04/22/2022     The total time spent in the appointment was 20-29 minutes.   Cassandra L Heilingoetter, PA-C 04/22/21

## 2021-04-22 ENCOUNTER — Inpatient Hospital Stay: Payer: Medicare Other | Attending: Internal Medicine

## 2021-04-22 ENCOUNTER — Encounter: Payer: Self-pay | Admitting: Physician Assistant

## 2021-04-22 ENCOUNTER — Inpatient Hospital Stay (HOSPITAL_BASED_OUTPATIENT_CLINIC_OR_DEPARTMENT_OTHER): Payer: Medicare Other | Admitting: Physician Assistant

## 2021-04-22 ENCOUNTER — Inpatient Hospital Stay: Payer: Medicare Other

## 2021-04-22 ENCOUNTER — Other Ambulatory Visit: Payer: Self-pay

## 2021-04-22 VITALS — BP 124/62 | HR 65 | Temp 97.8°F | Resp 17 | Ht 64.0 in | Wt 137.0 lb

## 2021-04-22 DIAGNOSIS — C3492 Malignant neoplasm of unspecified part of left bronchus or lung: Secondary | ICD-10-CM | POA: Diagnosis not present

## 2021-04-22 DIAGNOSIS — Z7984 Long term (current) use of oral hypoglycemic drugs: Secondary | ICD-10-CM | POA: Insufficient documentation

## 2021-04-22 DIAGNOSIS — Z79899 Other long term (current) drug therapy: Secondary | ICD-10-CM | POA: Insufficient documentation

## 2021-04-22 DIAGNOSIS — I10 Essential (primary) hypertension: Secondary | ICD-10-CM | POA: Diagnosis not present

## 2021-04-22 DIAGNOSIS — Z5112 Encounter for antineoplastic immunotherapy: Secondary | ICD-10-CM | POA: Insufficient documentation

## 2021-04-22 DIAGNOSIS — Z905 Acquired absence of kidney: Secondary | ICD-10-CM | POA: Insufficient documentation

## 2021-04-22 DIAGNOSIS — C3432 Malignant neoplasm of lower lobe, left bronchus or lung: Secondary | ICD-10-CM | POA: Diagnosis present

## 2021-04-22 DIAGNOSIS — R5383 Other fatigue: Secondary | ICD-10-CM | POA: Diagnosis not present

## 2021-04-22 LAB — CMP (CANCER CENTER ONLY)
ALT: 19 U/L (ref 0–44)
AST: 20 U/L (ref 15–41)
Albumin: 3.7 g/dL (ref 3.5–5.0)
Alkaline Phosphatase: 57 U/L (ref 38–126)
Anion gap: 11 (ref 5–15)
BUN: 27 mg/dL — ABNORMAL HIGH (ref 8–23)
CO2: 19 mmol/L — ABNORMAL LOW (ref 22–32)
Calcium: 9.2 mg/dL (ref 8.9–10.3)
Chloride: 108 mmol/L (ref 98–111)
Creatinine: 0.79 mg/dL (ref 0.44–1.00)
GFR, Estimated: >60 mL/min (ref >60)
Glucose, Bld: 133 mg/dL — ABNORMAL HIGH (ref 70–99)
Potassium: 3.6 mmol/L (ref 3.5–5.1)
Sodium: 138 mmol/L (ref 135–145)
Total Bilirubin: <0.1 mg/dL — ABNORMAL LOW (ref 0.3–1.2)
Total Protein: 7 g/dL (ref 6.5–8.1)

## 2021-04-22 LAB — CBC WITH DIFFERENTIAL (CANCER CENTER ONLY)
Abs Immature Granulocytes: 0.01 10*3/uL (ref 0.00–0.07)
Basophils Absolute: 0.1 10*3/uL (ref 0.0–0.1)
Basophils Relative: 2 %
Eosinophils Absolute: 0.3 10*3/uL (ref 0.0–0.5)
Eosinophils Relative: 8 %
HCT: 37.6 % (ref 36.0–46.0)
Hemoglobin: 11.6 g/dL — ABNORMAL LOW (ref 12.0–15.0)
Immature Granulocytes: 0 %
Lymphocytes Relative: 19 %
Lymphs Abs: 0.8 10*3/uL (ref 0.7–4.0)
MCH: 27 pg (ref 26.0–34.0)
MCHC: 30.9 g/dL (ref 30.0–36.0)
MCV: 87.6 fL (ref 80.0–100.0)
Monocytes Absolute: 0.3 10*3/uL (ref 0.1–1.0)
Monocytes Relative: 7 %
Neutro Abs: 2.8 10*3/uL (ref 1.7–7.7)
Neutrophils Relative %: 64 %
Platelet Count: 221 10*3/uL (ref 150–400)
RBC: 4.29 MIL/uL (ref 3.87–5.11)
RDW: 15.5 % (ref 11.5–15.5)
WBC Count: 4.4 10*3/uL (ref 4.0–10.5)
nRBC: 0 % (ref 0.0–0.2)

## 2021-04-22 MED ORDER — SODIUM CHLORIDE 0.9 % IV SOLN
350.0000 mg | Freq: Once | INTRAVENOUS | Status: AC
  Administered 2021-04-22: 350 mg via INTRAVENOUS
  Filled 2021-04-22: qty 7

## 2021-04-22 MED ORDER — SODIUM CHLORIDE 0.9 % IV SOLN: Freq: Once | INTRAVENOUS | Status: AC

## 2021-04-22 NOTE — Patient Instructions (Signed)
[facility] ONCOLOG[facility]  Discharge Instructions: Thank you for choosing [facility] to provide your oncology and hematology care.   If you have a lab appointment with the [facility], please go directly to the [facility] and check in at the registration area.   Wear comfortable clothing and clothing appropriate for easy access to any Portacath or PICC line.   We strive to give you quality time with your provider. You may need to reschedule your appointment if you arrive late (15 or more minutes).  Arriving late affects you and other patients whose appointments are after yours.  Also, if you miss three or more appointments without notifying the office, you may be dismissed from the clinic at the providers discretion.      For prescription refill requests, have your pharmacy contact our office and allow 72 hours for refills to be completed.    Today you received the following chemotherapy and/or immunotherapy agents: Libtayo      To help prevent nausea and vomiting after your treatment, we encourage you to take your nausea medication as directed.  BELOW ARE SYMPTOMS THAT SHOULD BE REPORTED IMMEDIATELY: *FEVER GREATER THAN 100.4 F (38 C) OR HIGHER *CHILLS OR SWEATING *NAUSEA AND VOMITING THAT IS NOT CONTROLLED WITH YOUR NAUSEA MEDICATION *UNUSUAL SHORTNESS OF BREATH *UNUSUAL BRUISING OR BLEEDING *URINARY PROBLEMS (pain or burning when urinating, or frequent urination) *BOWEL PROBLEMS (unusual diarrhea, constipation, pain near the anus) TENDERNESS IN MOUTH AND THROAT WITH OR WITHOUT PRESENCE OF ULCERS (sore throat, sores in mouth, or a toothache) UNUSUAL RASH, SWELLING OR PAIN  UNUSUAL VAGINAL DISCHARGE OR ITCHING   Items with * indicate a potential emergency and should be followed up as soon as possible or go to the Emergency Department if any problems should occur.  Please show the CHEMOTHERAPY ALERT CARD or IMMUNOTHERAPY ALERT CARD at check-in to  the Emergency Department and triage nurse.  Should you have questions after your visit or need to cancel or reschedule your appointment, please contact [facility]  Dept: [phone]  and follow the prompts.  Office hours are 8:00 a.m. to 4:30 p.m. Monday - Friday. Please note that voicemails left after 4:00 p.m. may not be returned until the following business day.  We are closed weekends and major holidays. You have access to a nurse at all times for urgent questions. Please call the main number to the clinic Dept: [phone] and follow the prompts.   For any non-urgent questions, you may also contact your provider using MyChart. We now offer e-Visits for anyone 30 and older to request care online for non-urgent symptoms. For details visit [email or web address].   Also download the MyChart app! Go to the app store, search "MyChart", open the app, select [facility], and log in with your MyChart username and password.  Due to Covid, a mask is required upon entering the hospital/clinic. If you do not have a mask, one will be given to you upon arrival. For doctor visits, patients may have 1 support person aged 42 or older with them. For treatment visits, patients cannot have anyone with them due to current Covid guidelines and our immunocompromised population.

## 2021-05-04 DIAGNOSIS — M1712 Unilateral primary osteoarthritis, left knee: Secondary | ICD-10-CM | POA: Insufficient documentation

## 2021-05-06 ENCOUNTER — Other Ambulatory Visit: Payer: Self-pay

## 2021-05-06 ENCOUNTER — Ambulatory Visit (HOSPITAL_COMMUNITY)
Admission: RE | Admit: 2021-05-06 | Discharge: 2021-05-06 | Disposition: A | Discharge status: Home / Self Care | Payer: Medicare Other | Source: Ambulatory Visit | Attending: Physician Assistant | Admitting: Physician Assistant

## 2021-05-06 ENCOUNTER — Encounter (HOSPITAL_COMMUNITY): Payer: Self-pay

## 2021-05-06 DIAGNOSIS — C3492 Malignant neoplasm of unspecified part of left bronchus or lung: Secondary | ICD-10-CM | POA: Diagnosis not present

## 2021-05-06 MED ORDER — IOHEXOL 300 MG/ML  SOLN
100.0000 mL | Freq: Once | INTRAMUSCULAR | Status: AC | PRN
  Administered 2021-05-06: 100 mL via INTRAVENOUS

## 2021-05-06 MED ORDER — SODIUM CHLORIDE (PF) 0.9 % IJ SOLN
INTRAMUSCULAR | Status: AC
  Filled 2021-05-06: qty 50

## 2021-05-09 NOTE — Progress Notes (Signed)
[facility] OFFICE PROGRESS NOTE  [name], NP [address] [place] [address]  DIAGNOSIS: Stage IV (T3, N0, M1 a) non-small cell lung cancer, adenocarcinoma presented with large left lower lobe lung mass in addition to another lingual mass and malignant left pleural effusion diagnosed in September 2021   Biomarker Findings Tumor Mutational Burden - 13 Muts/Mb Microsatellite status - MS-Stable Genomic Findings For a complete list of the genes assayed, please refer to the Appendix. KRAS G12C PRDM1 D266f*46 TERT promoter -146C>T TP53 V157F 7 Disease relevant genes with no reportable alterations: ALK, BRAF, EGFR, ERBB2, MET, RET, ROS1   PDL1 Expression: 95%  PRIOR THERAPY: None  CURRENT THERAPY:  Libtayo (Cempilimab) 350 mg IV every 3 weeks.  First dose January 15, 2020.  Status post 23 cycles  [name] 66y.o. female returns to the clinic for a follow up visit accompanied by her husband. The patient is feeling well today without any concerning complaints. The patient continues to tolerate treatment with immunotherapy with Libtayo well without any adverse side effects except she did previously have some scattered skin lesions which are consistent with immunotherapy mediated although this has recently resolved. She also had previously had intermittent diarrhea which fortunately, has improved lately.  Of note, the patient was found to have salmonella in November 2022.   Denies any fever, chills, or night sweats. She lost a few pounds but states she has a good appetite. Denies any chest pain, cough, or hemoptysis. She denies any significant shortness of breath.  Denies any nausea, vomiting, or constipation. Denies any headache or visual changes.  She can feel further enlargement of the right axillary lymph node.  She recently had a restaging CT scan performed.  She is here today for evaluation to review her scan results before  considering starting cycle #24.   MEDICAL HISTORY: Past Medical History:  Diagnosis Date   Hyperlipidemia    Hypertension    Pleural effusion    Pneumonia    Stage IV adenocarcinoma of lung, left (HCC)     ALLERGIES:  is allergic to ace inhibitors.  MEDICATIONS:  Current Outpatient Medications  Medication Sig Dispense Refill   cholecalciferol (VITAMIN D3) 25 MCG (1000 UNIT) tablet Take 2,000 Units by mouth daily.     levothyroxine (SYNTHROID) 50 MCG tablet TAKE 1 TABLET BY MOUTH EVERY DAY BEFORE BREAKFAST 90 tablet 0   metFORMIN (GLUCOPHAGE) 500 MG tablet Take 500 mg by mouth 2 (two) times daily with a meal.     pravastatin (PRAVACHOL) 40 MG tablet Take 40 mg by mouth daily.     prochlorperazine (COMPAZINE) 10 MG tablet Take 1 tablet (10 mg total) by mouth every 6 (six) hours as needed for nausea or vomiting. 30 tablet 0   sertraline (ZOLOFT) 50 MG tablet Take 75 mg by mouth daily.     losartan-hydrochlorothiazide (HYZAAR) 100-12.5 MG tablet Take 1 tablet by mouth daily. (Patient not taking: Reported on 04/22/2021)     No current facility-administered medications for this visit.    SURGICAL HISTORY:  Past Surgical History:  Procedure Laterality Date   NEPHRECTOMY Left     REVIEW OF SYSTEMS:   Review of Systems  Constitutional: Positive for few pound weight loss.  Negative for appetite change, chills, fatigue, and fever.  HENT: Negative for mouth sores, nosebleeds, sore throat and trouble swallowing.   Eyes: Negative for eye problems and icterus.  Respiratory: Negative for cough, hemoptysis, shortness  of breath and wheezing.   Cardiovascular: Negative for chest pain and leg swelling.  Gastrointestinal: Negative for abdominal pain, constipation, diarrhea, nausea and vomiting.  Genitourinary: Negative for bladder incontinence, difficulty urinating, dysuria, frequency and hematuria.   Musculoskeletal: Negative for back pain, gait problem, neck pain and neck stiffness.  Skin:  Negative for itching and rash.  Neurological: Negative for dizziness, extremity weakness, gait problem, headaches, light-headedness and seizures.  Hematological: Negative for adenopathy. Does not bruise/bleed easily.  Psychiatric/Behavioral: Negative for confusion, depression and sleep disturbance. The patient is not nervous/anxious.     PHYSICAL EXAMINATION:  Blood pressure (!) 141/73, pulse 67, temperature (!) 97.3 F (36.3 C), temperature source Tympanic, resp. rate 16, weight 133 lb 11.2 oz (60.6 kg), SpO2 100 %.  ECOG PERFORMANCE STATUS: 1  Physical Exam  Constitutional: Oriented to person, place, and time and well-developed, well-nourished, and in no distress. HENT:  Head: Normocephalic and atraumatic.  Mouth/Throat: Oropharynx is clear and moist. No oropharyngeal exudate.  Eyes: Conjunctivae are normal. Right eye exhibits no discharge. Left eye exhibits no discharge. No scleral icterus.  Neck: Normal range of motion. Neck supple.  Cardiovascular: Normal rate, regular rhythm, normal heart sounds and intact distal pulses.   Pulmonary/Chest: Effort normal and breath sounds normal. No respiratory distress. No wheezes. No rales.  Abdominal: Soft. Bowel sounds are normal. Exhibits no distension and no mass. There is no tenderness.  Musculoskeletal: Normal range of motion. Exhibits no edema.  Lymphadenopathy:  Positive for right axillary lymphadenopathy. Neurological: Alert and oriented to person, place, and time. Exhibits normal muscle tone. Gait normal. Coordination normal.  Skin: Skin is warm and dry. No rash noted. Not diaphoretic. No erythema. No pallor.  Psychiatric: Mood, memory and judgment normal.  Vitals reviewed.  LABORATORY DATA: Lab Results  Component Value Date   WBC 4.4 05/13/2021   HGB 10.7 (L) 05/13/2021   HCT 34.2 (L) 05/13/2021   MCV 85.3 05/13/2021   PLT 325 05/13/2021      Chemistry      Component Value Date/Time   NA 138 05/13/2021 1000   K 4.0  05/13/2021 1000   CL 109 05/13/2021 1000   CO2 23 05/13/2021 1000   BUN 19 05/13/2021 1000   CREATININE 0.97 05/13/2021 1000      Component Value Date/Time   CALCIUM 8.9 05/13/2021 1000   ALKPHOS 67 05/13/2021 1000   AST 21 05/13/2021 1000   ALT 19 05/13/2021 1000   BILITOT 0.4 05/13/2021 1000       RADIOGRAPHIC STUDIES:  CT Chest W Contrast  Result Date: 05/07/2021 CLINICAL DATA:  Metastatic non-small cell lung cancer restaging, ongoing immunotherapy, patient reports swollen lymph nodes in the right axilla EXAM: CT CHEST, ABDOMEN, AND PELVIS WITH CONTRAST TECHNIQUE: Multidetector CT imaging of the chest, abdomen and pelvis was performed following the standard protocol during bolus administration of intravenous contrast. RADIATION DOSE REDUCTION: This exam was performed according to the departmental dose-optimization program which includes automated exposure control, adjustment of the mA and/or kV according to patient size and/or use of iterative reconstruction technique. CONTRAST:  167m OMNIPAQUE IOHEXOL 300 MG/ML SOLN, additional oral enteric contrast COMPARISON:  02/14/2021 FINDINGS: CT CHEST FINDINGS Cardiovascular: Aortic atherosclerosis. Normal heart size. Three-vessel coronary artery calcifications. No pericardial effusion. Mediastinum/Nodes: Continued enlargement of right axillary lymph nodes, now measuring up to 2.1 x 1.7 cm, previously 1.5 x 1.0 cm, with adjacent fat stranding and internal hypodensity (series 2, image 23). Unchanged retrosternal/epicardial lymph node or soft tissue nodule measuring  1.3 x 1.1 cm (series 2, image 32). Slight enlargement of an additional subcentimeter epicardial lymph node or soft tissue nodule overlying the pulmonary outflow tract, measuring 0.8 x 0.6 cm, previously 0.7 x 0.4 cm (series 2, image 32). Slight interval enlargement of subcentimeter prevascular lymph nodes, measuring up to 0.8 x 0.8 cm, previously 0.8 x 0.5 cm (series 2, image 20). Thyroid  gland, trachea, and esophagus demonstrate no significant findings. Lungs/Pleura: Unchanged post treatment appearance of the left lung, with volume loss, areas of rounded atelectasis, and a small, loculated left pleural effusion with some associated pleural thickening. Tiny pulmonary nodules of the right upper lobe are unchanged, measuring no greater than 0.3 cm (series 6, image 47). Musculoskeletal: No chest wall mass or suspicious osseous lesions identified. CT ABDOMEN PELVIS FINDINGS Hepatobiliary: No solid liver abnormality is seen. No gallstones, gallbladder wall thickening, or biliary dilatation. Pancreas: Unremarkable. No pancreatic ductal dilatation or surrounding inflammatory changes. Spleen: Normal in size without significant abnormality. Adrenals/Urinary Tract: Adrenal glands are unremarkable. Status post left nephrectomy. The right kidney is normal, without renal calculi, solid lesion, or hydronephrosis. Bladder is unremarkable. Stomach/Bowel: Stomach is within normal limits. Appendix appears normal. No evidence of bowel wall thickening, distention, or inflammatory changes. Large burden of stool throughout the colon. Vascular/Lymphatic: Aortic atherosclerosis. Unchanged, prominent subcentimeter left retroperitoneal lymph nodes, measuring up to 1.3 x 0.8 cm (series 2, image 68). Reproductive: No mass or other abnormality. Other: No abdominal wall hernia or abnormality. No ascites. Musculoskeletal: No acute osseous findings. IMPRESSION: 1. Continued enlargement of right axillary lymph nodes, now with adjacent fat stranding and internal hypodensity. These are highly concerning for metastases, possibly with interval necrosis, given progressive enlargement over time. 2. Unchanged retrosternal/epicardial lymph node or soft tissue nodule. Slight interval enlargement of an additional subcentimeter epicardial lymph node or soft tissue nodule overlying the pulmonary outflow tract. Slight interval enlargement of  subcentimeter prevascular lymph nodes. These are likewise highly concerning for small nodal metastases. 3. Unchanged, prominent subcentimeter left retroperitoneal lymph nodes, nonspecific and less worrisome for nodal metastatic disease. Attention on follow-up. 4. Unchanged post treatment appearance of the left lung, with volume loss, areas of rounded atelectasis, and a small, loculated left pleural effusion with some associated pleural thickening. 5. Tiny pulmonary nodules of the right upper lobe are unchanged, measuring no greater than 0.3 cm, likely benign and incidental. Attention on follow-up. 6. Status post left nephrectomy. 7. Coronary artery disease. Aortic Atherosclerosis (ICD10-I70.0). Electronically Signed   By: Delanna Ahmadi M.D.   On: 05/07/2021 10:23   CT Abdomen Pelvis W Contrast  Result Date: 05/07/2021 CLINICAL DATA:  Metastatic non-small cell lung cancer restaging, ongoing immunotherapy, patient reports swollen lymph nodes in the right axilla EXAM: CT CHEST, ABDOMEN, AND PELVIS WITH CONTRAST TECHNIQUE: Multidetector CT imaging of the chest, abdomen and pelvis was performed following the standard protocol during bolus administration of intravenous contrast. RADIATION DOSE REDUCTION: This exam was performed according to the departmental dose-optimization program which includes automated exposure control, adjustment of the mA and/or kV according to patient size and/or use of iterative reconstruction technique. CONTRAST:  187m OMNIPAQUE IOHEXOL 300 MG/ML SOLN, additional oral enteric contrast COMPARISON:  02/14/2021 FINDINGS: CT CHEST FINDINGS Cardiovascular: Aortic atherosclerosis. Normal heart size. Three-vessel coronary artery calcifications. No pericardial effusion. Mediastinum/Nodes: Continued enlargement of right axillary lymph nodes, now measuring up to 2.1 x 1.7 cm, previously 1.5 x 1.0 cm, with adjacent fat stranding and internal hypodensity (series 2, image 23). Unchanged  retrosternal/epicardial lymph node or  soft tissue nodule measuring 1.3 x 1.1 cm (series 2, image 32). Slight enlargement of an additional subcentimeter epicardial lymph node or soft tissue nodule overlying the pulmonary outflow tract, measuring 0.8 x 0.6 cm, previously 0.7 x 0.4 cm (series 2, image 32). Slight interval enlargement of subcentimeter prevascular lymph nodes, measuring up to 0.8 x 0.8 cm, previously 0.8 x 0.5 cm (series 2, image 20). Thyroid gland, trachea, and esophagus demonstrate no significant findings. Lungs/Pleura: Unchanged post treatment appearance of the left lung, with volume loss, areas of rounded atelectasis, and a small, loculated left pleural effusion with some associated pleural thickening. Tiny pulmonary nodules of the right upper lobe are unchanged, measuring no greater than 0.3 cm (series 6, image 47). Musculoskeletal: No chest wall mass or suspicious osseous lesions identified. CT ABDOMEN PELVIS FINDINGS Hepatobiliary: No solid liver abnormality is seen. No gallstones, gallbladder wall thickening, or biliary dilatation. Pancreas: Unremarkable. No pancreatic ductal dilatation or surrounding inflammatory changes. Spleen: Normal in size without significant abnormality. Adrenals/Urinary Tract: Adrenal glands are unremarkable. Status post left nephrectomy. The right kidney is normal, without renal calculi, solid lesion, or hydronephrosis. Bladder is unremarkable. Stomach/Bowel: Stomach is within normal limits. Appendix appears normal. No evidence of bowel wall thickening, distention, or inflammatory changes. Large burden of stool throughout the colon. Vascular/Lymphatic: Aortic atherosclerosis. Unchanged, prominent subcentimeter left retroperitoneal lymph nodes, measuring up to 1.3 x 0.8 cm (series 2, image 68). Reproductive: No mass or other abnormality. Other: No abdominal wall hernia or abnormality. No ascites. Musculoskeletal: No acute osseous findings. IMPRESSION: 1. Continued  enlargement of right axillary lymph nodes, now with adjacent fat stranding and internal hypodensity. These are highly concerning for metastases, possibly with interval necrosis, given progressive enlargement over time. 2. Unchanged retrosternal/epicardial lymph node or soft tissue nodule. Slight interval enlargement of an additional subcentimeter epicardial lymph node or soft tissue nodule overlying the pulmonary outflow tract. Slight interval enlargement of subcentimeter prevascular lymph nodes. These are likewise highly concerning for small nodal metastases. 3. Unchanged, prominent subcentimeter left retroperitoneal lymph nodes, nonspecific and less worrisome for nodal metastatic disease. Attention on follow-up. 4. Unchanged post treatment appearance of the left lung, with volume loss, areas of rounded atelectasis, and a small, loculated left pleural effusion with some associated pleural thickening. 5. Tiny pulmonary nodules of the right upper lobe are unchanged, measuring no greater than 0.3 cm, likely benign and incidental. Attention on follow-up. 6. Status post left nephrectomy. 7. Coronary artery disease. Aortic Atherosclerosis (ICD10-I70.0). Electronically Signed   By: Delanna Ahmadi M.D.   On: 05/07/2021 10:23     ASSESSMENT/PLAN:  This is a very pleasant 66 year old Caucasian female diagnosed with stage IV (T3, N0, M1A) non-small cell lung cancer, adenocarcinoma.  She presented with a large left lower lobe lung mass in addition to another lingular mass and a malignant left pleural effusion.  She was diagnosed in September 2021.  Her molecular studies show that she has a PD-L1 expression at 95%.  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   The patient is currently undergoing treatment with single agent Libtayo (Cempilimab) 350 mg IV every 3 weeks for up to 2 years unless the patient has evidence for disease progression or unacceptable toxicity.  She  is status post 23 cycles and tolerated it well without any concerning adverse side effects.   The patient recently had a restaging CT scan performed.  Dr. Julien Nordmann personally and independently reviewed the scan  discussed results with the patient today.  The scan showed enlargement of a right axillary lymph node which is concerning for metastasis.  There was a slight interval enlargement of a subcentimeter epicardial lymph node or soft tissue nodule overlying the pulmonary outflow tract.  There was slight interval enlargement of the prevascular lymph node.  Dr. Julien Nordmann recommends arranging for an ultrasound-guided core biopsy of the right axillary lymph node to assess for metastatic disease.  If this is consistent with metastatic disease, Dr. Julien Nordmann discussed we may consider referring the patient to radiation oncology or surgical excision.    Dr. Julien Nordmann recommends keeping the patient on the same treatment at the same dose for now.  She will continue with Libtayo cycle #24 today as scheduled.  The patient was advised to call immediately if she has any concerning symptoms in the interval. The patient voices understanding of current disease status and treatment options and is in agreement with the current care plan. All questions were answered. The patient knows to call the clinic with any problems, questions or concerns. We can certainly see the patient much sooner if necessary  Orders Placed This Encounter  Procedures   [name]    Standing Status:   Future    Standing Expiration Date:   05/14/2022    Order Specific Question:   Lab orders requested (DO NOT place separate lab orders, these will be automatically ordered during procedure specimen collection):    Answer:   Surgical Pathology    Order Specific Question:   Reason for Exam (SYMPTOM  OR DIAGNOSIS REQUIRED)    Answer:   Has lung cancer with enlarging right axillary lymph nodes. Please assess if this is metastatic disease     Order Specific Question:   Preferred location?    Answer:   [facility], PA-C 05/13/21  ADDENDUM: Hematology/Oncology Attending: I had a face-to-face encounter with the patient today.  I reviewed her record, lab, scan and recommended her care plan.  This is a very pleasant 66 years old white female with stage IV non-small cell lung cancer, adenocarcinoma diagnosed in September 2021 and presented with large left lower lobe lung mass in addition to lingular mass and malignant left pleural effusion.  She has positive KRAS G12C mutation and PD-L1 expression of 95%. The patient started treatment with immunotherapy with Libtayo (Cempilimab) 350 Mg IV every 3 weeks status post 23 cycles. She has been tolerating her treatment well with no concerning adverse effects. The patient had repeat CT scan of the chest, abdomen pelvis performed recently.  I personally and independently reviewed the scan images and discussed the results with the patient and her husband. Her scan showed stable disease except for enlarging right axillary lymph node. I recommended for the patient to have ultrasound-guided core biopsy of the enlarging right axillary lymph node to rule out disease progression or any other type of malignancy unrelated to her lung cancer. She will continue her current treatment with Libtayo (Cempilimab) for now.  If the final pathology from the biopsy is consistent with non-small cell lung cancer, adenocarcinoma, would consider The patient for palliative radiotherapy to this area. She will come back for follow-up visit in 3 weeks for evaluation before starting cycle #25. The patient was advised to call immediately if she has any other concerning symptoms in the interval.  The total time spent in the appointment was 30 minutes. Disclaimer: This note was dictated with  voice recognition software. Similar sounding words can inadvertently be transcribed and may be  missed upon review. Eilleen Kempf, MD

## 2021-05-13 ENCOUNTER — Inpatient Hospital Stay: Payer: Medicare Other

## 2021-05-13 ENCOUNTER — Encounter: Payer: Self-pay | Admitting: Physician Assistant

## 2021-05-13 ENCOUNTER — Other Ambulatory Visit: Payer: Self-pay

## 2021-05-13 ENCOUNTER — Inpatient Hospital Stay: Payer: Medicare Other | Attending: Internal Medicine

## 2021-05-13 ENCOUNTER — Inpatient Hospital Stay (HOSPITAL_BASED_OUTPATIENT_CLINIC_OR_DEPARTMENT_OTHER): Payer: Medicare Other | Admitting: Physician Assistant

## 2021-05-13 ENCOUNTER — Encounter (HOSPITAL_COMMUNITY): Payer: Self-pay | Admitting: Radiology

## 2021-05-13 VITALS — BP 141/73 | HR 67 | Temp 97.3°F | Resp 16 | Wt 133.7 lb

## 2021-05-13 DIAGNOSIS — Z7984 Long term (current) use of oral hypoglycemic drugs: Secondary | ICD-10-CM | POA: Insufficient documentation

## 2021-05-13 DIAGNOSIS — C3432 Malignant neoplasm of lower lobe, left bronchus or lung: Secondary | ICD-10-CM | POA: Insufficient documentation

## 2021-05-13 DIAGNOSIS — Z79899 Other long term (current) drug therapy: Secondary | ICD-10-CM | POA: Diagnosis not present

## 2021-05-13 DIAGNOSIS — R590 Localized enlarged lymph nodes: Secondary | ICD-10-CM | POA: Insufficient documentation

## 2021-05-13 DIAGNOSIS — C3492 Malignant neoplasm of unspecified part of left bronchus or lung: Secondary | ICD-10-CM | POA: Diagnosis not present

## 2021-05-13 DIAGNOSIS — R5383 Other fatigue: Secondary | ICD-10-CM

## 2021-05-13 DIAGNOSIS — Z905 Acquired absence of kidney: Secondary | ICD-10-CM | POA: Insufficient documentation

## 2021-05-13 DIAGNOSIS — R591 Generalized enlarged lymph nodes: Secondary | ICD-10-CM | POA: Diagnosis not present

## 2021-05-13 DIAGNOSIS — Z5112 Encounter for antineoplastic immunotherapy: Secondary | ICD-10-CM | POA: Insufficient documentation

## 2021-05-13 DIAGNOSIS — J910 Malignant pleural effusion: Secondary | ICD-10-CM | POA: Insufficient documentation

## 2021-05-13 LAB — CBC WITH DIFFERENTIAL (CANCER CENTER ONLY)
Abs Immature Granulocytes: 0.01 10*3/uL (ref 0.00–0.07)
Basophils Absolute: 0.1 10*3/uL (ref 0.0–0.1)
Basophils Relative: 1 %
Eosinophils Absolute: 0.2 10*3/uL (ref 0.0–0.5)
Eosinophils Relative: 5 %
HCT: 34.2 % — ABNORMAL LOW (ref 36.0–46.0)
Hemoglobin: 10.7 g/dL — ABNORMAL LOW (ref 12.0–15.0)
Immature Granulocytes: 0 %
Lymphocytes Relative: 22 %
Lymphs Abs: 1 10*3/uL (ref 0.7–4.0)
MCH: 26.7 pg (ref 26.0–34.0)
MCHC: 31.3 g/dL (ref 30.0–36.0)
MCV: 85.3 fL (ref 80.0–100.0)
Monocytes Absolute: 0.4 10*3/uL (ref 0.1–1.0)
Monocytes Relative: 9 %
Neutro Abs: 2.8 10*3/uL (ref 1.7–7.7)
Neutrophils Relative %: 63 %
Platelet Count: 325 10*3/uL (ref 150–400)
RBC: 4.01 MIL/uL (ref 3.87–5.11)
RDW: 15.5 % (ref 11.5–15.5)
WBC Count: 4.4 10*3/uL (ref 4.0–10.5)
nRBC: 0 % (ref 0.0–0.2)

## 2021-05-13 LAB — CMP (CANCER CENTER ONLY)
ALT: 19 U/L (ref 0–44)
AST: 21 U/L (ref 15–41)
Albumin: 3.7 g/dL (ref 3.5–5.0)
Alkaline Phosphatase: 67 U/L (ref 38–126)
Anion gap: 6 (ref 5–15)
BUN: 19 mg/dL (ref 8–23)
CO2: 23 mmol/L (ref 22–32)
Calcium: 8.9 mg/dL (ref 8.9–10.3)
Chloride: 109 mmol/L (ref 98–111)
Creatinine: 0.97 mg/dL (ref 0.44–1.00)
GFR, Estimated: >60 mL/min (ref >60)
Glucose, Bld: 162 mg/dL — ABNORMAL HIGH (ref 70–99)
Potassium: 4 mmol/L (ref 3.5–5.1)
Sodium: 138 mmol/L (ref 135–145)
Total Bilirubin: 0.4 mg/dL (ref 0.3–1.2)
Total Protein: 6.9 g/dL (ref 6.5–8.1)

## 2021-05-13 LAB — TSH: TSH: 4.259 u[IU]/mL — ABNORMAL HIGH (ref 0.308–3.960)

## 2021-05-13 MED ORDER — SODIUM CHLORIDE 0.9 % IV SOLN: Freq: Once | INTRAVENOUS | Status: AC

## 2021-05-13 MED ORDER — SODIUM CHLORIDE 0.9 % IV SOLN
350.0000 mg | Freq: Once | INTRAVENOUS | Status: AC
  Administered 2021-05-13: 350 mg via INTRAVENOUS
  Filled 2021-05-13: qty 7

## 2021-05-13 NOTE — Patient Instructions (Signed)
Gabrielle [name]  Discharge Instructions: ?Thank you for choosing [facility] to provide your oncology and hematology care.  ? ?If you have a lab appointment with the [facility], please go directly to the [facility] and check in at the registration area. ?  ?Wear comfortable clothing and clothing appropriate for easy access to any Portacath or PICC line.  ? ?We strive to give you quality time with your provider. You may need to reschedule your appointment if you arrive late (15 or more minutes).  Arriving late affects you and other patients whose appointments are after yours.  Also, if you miss three or more appointments without notifying the office, you may be dismissed from the clinic at the provider?s discretion.    ?  ?For prescription refill requests, have your pharmacy contact our office and allow 72 hours for refills to be completed.   ? ?Today you received the following chemotherapy and/or immunotherapy agents: Libtayo    ?  ?To help prevent nausea and vomiting after your treatment, we encourage you to take your nausea medication as directed. ? ?BELOW ARE SYMPTOMS THAT SHOULD BE REPORTED IMMEDIATELY: ?*FEVER GREATER THAN 100.4 F (38 ?C) OR HIGHER ?*CHILLS OR SWEATING ?*NAUSEA AND VOMITING THAT IS NOT CONTROLLED WITH YOUR NAUSEA MEDICATION ?*UNUSUAL SHORTNESS OF BREATH ?*UNUSUAL BRUISING OR BLEEDING ?*URINARY PROBLEMS (pain or burning when urinating, or frequent urination) ?*BOWEL PROBLEMS (unusual diarrhea, constipation, pain near the anus) ?TENDERNESS IN MOUTH AND THROAT WITH OR WITHOUT PRESENCE OF ULCERS (sore throat, sores in mouth, or a toothache) ?UNUSUAL RASH, SWELLING OR PAIN  ?UNUSUAL VAGINAL DISCHARGE OR ITCHING  ? ?Items with * indicate a potential emergency and should be followed up as soon as possible or go to the Emergency Department if any problems should occur. ? ?Please show the CHEMOTHERAPY ALERT CARD or IMMUNOTHERAPY ALERT CARD at check-in to  the Emergency Department and triage nurse. ? ?Should you have questions after your visit or need to cancel or reschedule your appointment, please contact [facility]  Dept: [phone]  and follow the prompts.  Office hours are 8:00 a.m. to 4:30 p.m. Monday - Friday. Please note that voicemails left after 4:00 p.m. may not be returned until the following business day.  We are closed weekends and major holidays. You have access to a nurse at all times for urgent questions. Please call the main number to the clinic Dept: [phone] and follow the prompts. ? ? ?For any non-urgent questions, you may also contact your provider using MyChart. We now offer e-Visits for anyone 10 and older to request care online for non-urgent symptoms. For details visit [email or web address]. ?  ?Also download the MyChart app! Go to the app store, search "MyChart", open the app, select [facility], and log in with your MyChart username and password. ? ?Due to Covid, a mask is required upon entering the hospital/clinic. If you do not have a mask, one will be given to you upon arrival. For doctor visits, patients may have 1 support person aged 67 or older with them. For treatment visits, patients cannot have anyone with them due to current Covid guidelines and our immunocompromised population.  ? ?

## 2021-05-13 NOTE — Progress Notes (Signed)
Patient Name  ?Gabrielle [name] Legal Sex  ?Female DOB  ?15-Jul-1955 SSN  ?[id number] Address  ?[facility] [place] [address] Phone  ?[phone] (Home) *Preferred*  ?[phone] (Work)  ?[phone] (Mobile)  ? ? RE: [name] ?Received: Today ?Arne Cleveland, MD  Garth Bigness D ?Ok  ? ?[place] core R axillary LAN  ? ?DD[facility]   ?  ?   ?Previous Messages ?  ?----- Message -----  ?From: Garth Bigness D  ?Sent: 05/13/2021  11:26 AM EST  ?To: [facility] Procedure Requests  ?Subject: [name]                  ? ?Procedure:  [name]  ? ?Reason:  Adenocarcinoma of left lung, stage 4,  Lymphadenopathy, Has lung cancer with enlarging right axillary lymph nodes. Please assess if this is metastatic disease  ? ?History:   CT in computer  ? ?Provider:  Roosevelt Locks L  ? ?Provider Contact:  [phone]  ?

## 2021-05-20 ENCOUNTER — Other Ambulatory Visit: Payer: Self-pay | Admitting: Radiology

## 2021-05-21 ENCOUNTER — Other Ambulatory Visit: Payer: Self-pay | Admitting: Radiology

## 2021-05-22 ENCOUNTER — Ambulatory Visit (HOSPITAL_COMMUNITY)
Admission: RE | Admit: 2021-05-22 | Discharge: 2021-05-22 | Disposition: A | Discharge status: Home / Self Care | Payer: Medicare Other | Source: Ambulatory Visit | Attending: Physician Assistant | Admitting: Physician Assistant

## 2021-05-22 ENCOUNTER — Other Ambulatory Visit: Payer: Self-pay

## 2021-05-22 DIAGNOSIS — R590 Localized enlarged lymph nodes: Secondary | ICD-10-CM | POA: Insufficient documentation

## 2021-05-22 DIAGNOSIS — Z905 Acquired absence of kidney: Secondary | ICD-10-CM | POA: Insufficient documentation

## 2021-05-22 DIAGNOSIS — I2510 Atherosclerotic heart disease of native coronary artery without angina pectoris: Secondary | ICD-10-CM | POA: Diagnosis not present

## 2021-05-22 DIAGNOSIS — C3490 Malignant neoplasm of unspecified part of unspecified bronchus or lung: Secondary | ICD-10-CM | POA: Diagnosis not present

## 2021-05-22 LAB — CBC WITH DIFFERENTIAL/PLATELET
Abs Immature Granulocytes: 0.02 10*3/uL (ref 0.00–0.07)
Basophils Absolute: 0.1 10*3/uL (ref 0.0–0.1)
Basophils Relative: 1 %
Eosinophils Absolute: 0.3 10*3/uL (ref 0.0–0.5)
Eosinophils Relative: 5 %
HCT: 34.6 % — ABNORMAL LOW (ref 36.0–46.0)
Hemoglobin: 10.7 g/dL — ABNORMAL LOW (ref 12.0–15.0)
Immature Granulocytes: 0 %
Lymphocytes Relative: 25 %
Lymphs Abs: 1.3 10*3/uL (ref 0.7–4.0)
MCH: 27 pg (ref 26.0–34.0)
MCHC: 30.9 g/dL (ref 30.0–36.0)
MCV: 87.2 fL (ref 80.0–100.0)
Monocytes Absolute: 0.6 10*3/uL (ref 0.1–1.0)
Monocytes Relative: 12 %
Neutro Abs: 3 10*3/uL (ref 1.7–7.7)
Neutrophils Relative %: 57 %
Platelets: 262 10*3/uL (ref 150–400)
RBC: 3.97 MIL/uL (ref 3.87–5.11)
RDW: 16.1 % — ABNORMAL HIGH (ref 11.5–15.5)
WBC: 5.3 10*3/uL (ref 4.0–10.5)
nRBC: 0 % (ref 0.0–0.2)

## 2021-05-22 LAB — PROTIME-INR
INR: 1 (ref 0.8–1.2)
Prothrombin Time: 13.4 seconds (ref 11.4–15.2)

## 2021-05-22 LAB — GLUCOSE, CAPILLARY
Glucose-Capillary: 66 mg/dL — ABNORMAL LOW (ref 70–99)
Glucose-Capillary: 104 mg/dL — ABNORMAL HIGH (ref 70–99)
Glucose-Capillary: 113 mg/dL — ABNORMAL HIGH (ref 70–99)

## 2021-05-22 MED ORDER — FENTANYL CITRATE (PF) 100 MCG/2ML IJ SOLN
INTRAMUSCULAR | Status: AC | PRN
Start: 2021-05-22 — End: 2021-05-22
  Administered 2021-05-22: 50 ug via INTRAVENOUS

## 2021-05-22 MED ORDER — DEXTROSE 5 % IV SOLN
INTRAVENOUS | Status: DC
Start: 2021-05-22 — End: 2021-05-23

## 2021-05-22 MED ORDER — FENTANYL CITRATE (PF) 100 MCG/2ML IJ SOLN
INTRAMUSCULAR | Status: AC
  Filled 2021-05-22: qty 2

## 2021-05-22 MED ORDER — LIDOCAINE HCL (PF) 1 % IJ SOLN
INTRAMUSCULAR | Status: AC | PRN
  Administered 2021-05-22: 10 mL via INTRADERMAL

## 2021-05-22 MED ORDER — FENTANYL CITRATE (PF) 100 MCG/2ML IJ SOLN
INTRAMUSCULAR | Status: AC | PRN
  Administered 2021-05-22: 50 ug via INTRAVENOUS

## 2021-05-22 MED ORDER — LIDOCAINE HCL 1 % IJ SOLN
INTRAMUSCULAR | Status: AC
Start: 2021-05-22 — End: 2021-05-22
  Administered 2021-05-22: 10 mL
  Filled 2021-05-22: qty 20

## 2021-05-22 MED ORDER — MIDAZOLAM HCL 2 MG/2ML IJ SOLN
INTRAMUSCULAR | Status: AC | PRN
  Administered 2021-05-22: 1 mg via INTRAVENOUS

## 2021-05-22 MED ORDER — MIDAZOLAM HCL 2 MG/2ML IJ SOLN
INTRAMUSCULAR | Status: AC
  Filled 2021-05-22: qty 2

## 2021-05-22 MED ORDER — SODIUM CHLORIDE 0.9 % IV SOLN: INTRAVENOUS | Status: DC

## 2021-05-22 NOTE — H&P (Signed)
? ? ?Referring Physician(s): ?Mohamed,M ? ?Supervising Physician: Arne [place] ? ?Patient Status:  [facility] OP ? ?Chief Complaint: ?" I am having a lymph node biopsy" ? ? ?Subjective: ?Patient familiar to IR service from left thoracentesis x2 in 2021.  She has a history of stage IV left lung adenocarcinoma diagnosed in 2021.  Follow-up imaging has revealed:  ? ?1. Continued enlargement of right axillary lymph nodes, now with ?adjacent fat stranding and internal hypodensity. These are highly ?concerning for metastases, possibly with interval necrosis, given ?progressive enlargement over time. ?2. Unchanged retrosternal/epicardial lymph node or soft tissue ?nodule. Slight interval enlargement of an additional subcentimeter ?epicardial lymph node or soft tissue nodule overlying the pulmonary ?outflow tract. Slight interval enlargement of subcentimeter ?prevascular lymph nodes. These are likewise highly concerning for ?small nodal metastases. ?3. Unchanged, prominent subcentimeter left retroperitoneal lymph ?nodes, nonspecific and less worrisome for nodal metastatic disease. ?Attention on follow-up. ?4. Unchanged post treatment appearance of the left lung, with volume ?loss, areas of rounded atelectasis, and a small, loculated left ?pleural effusion with some associated pleural thickening. ?5. Tiny pulmonary nodules of the right upper lobe are unchanged, ?measuring no greater than 0.3 cm, likely benign and incidental. ?Attention on follow-up. ?6. Status post left nephrectomy. ?7. Coronary artery disease ? ?She presents today for image guided right axillary lymph node biopsy for further evaluation. ? ? ?Past Medical History:  ?Diagnosis Date  ? Hyperlipidemia   ? Hypertension   ? Pleural effusion   ? Pneumonia   ? Stage IV adenocarcinoma of lung, left ([place])   ? ?Past Surgical History:  ?Procedure Laterality Date  ? NEPHRECTOMY Left   ? ? ?Allergies: ?Ace inhibitors ? ?Medications: ?Prior to Admission medications    ?Medication Sig Start Date End Date Taking? Authorizing Provider  ?cholecalciferol (VITAMIN D3) 25 MCG (1000 UNIT) tablet Take 2,000 Units by mouth daily.    [provider]  ?levothyroxine (SYNTHROID) 50 MCG tablet TAKE 1 TABLET BY MOUTH EVERY DAY BEFORE BREAKFAST 01/29/21   Curt Bears, MD  ?losartan-hydrochlorothiazide [facility]) 100-12.5 MG tablet Take 1 tablet by mouth daily. ?Patient not taking: Reported on 04/22/2021    [provider]  ?metFORMIN (GLUCOPHAGE) 500 MG tablet Take 500 mg by mouth 2 (two) times daily with a meal.    [provider]  ?pravastatin (PRAVACHOL) 40 MG tablet Take 40 mg by mouth daily.    [provider]  ?prochlorperazine (COMPAZINE) 10 MG tablet Take 1 tablet (10 mg total) by mouth every 6 (six) hours as needed for nausea or vomiting. 01/08/20   Curt Bears, MD  ?sertraline (ZOLOFT) 50 MG tablet Take 75 mg by mouth daily.    [provider]  ? ? ? ?Vital Signs: Blood pressure 128/64, heart rate 59, temp 97.9, respirations 15, O2 sat 99% room air ? ? ? ?Physical Exam awake, alert.  Chest with slightly diminished breath sounds left base, right clear.  Heart with slightly bradycardic but regular rhythm.  Abdomen soft, positive bowel sounds, nontender.  No lower extremity edema. ? ?Imaging: ?No results found. ? ?Labs: ? ?CBC: ?Recent Labs  ?  03/11/21 ?1356 04/01/21 ?1024 04/22/21 ?1027 05/13/21 ?1000  ?WBC 5.6 4.0 4.4 4.4  ?HGB 11.1* 10.4* 11.6* 10.7*  ?HCT 35.1* 33.8* 37.6 34.2*  ?PLT 323 266 221 325  ? ? ?COAGS: ?No results for input(s): INR, APTT in the last 8760 hours. ? ?BMP: ?Recent Labs  ?  03/11/21 ?1356 04/01/21 ?1024 04/22/21 ?1027 05/13/21 ?1000  ?NA 136  138 138 138  ?K 3.9 4.3 3.6 4.0  ?CL 105 107 108 109  ?CO2 23 24 19* 23  ?GLUCOSE 88 169* 133* 162*  ?BUN 23 16 27* 19  ?CALCIUM 9.6 9.1 9.2 8.9  ?CREATININE 0.77 0.85 0.79 0.97  ?GFRNONAA >60 >60 >60 >60  ? ? ?LIVER FUNCTION TESTS: ?Recent Labs  ?  03/11/21 ?1356  04/01/21 ?1024 04/22/21 ?1027 05/13/21 ?1000  ?BILITOT 0.3 0.3 <0.1* 0.4  ?AST 15 17 20 21   ?ALT 16 18 19 19   ?ALKPHOS 61 58 57 67  ?PROT 7.3 6.6 7.0 6.9  ?ALBUMIN 3.8 3.5 3.7 3.7  ? ? ?Assessment and Plan: ?Patient familiar to IR service from left thoracentesis x2 in 2021.  She has a history of stage IV left lung adenocarcinoma diagnosed in 2021.  Follow-up imaging has revealed:  ? ?1. Continued enlargement of right axillary lymph nodes, now with ?adjacent fat stranding and internal hypodensity. These are highly ?concerning for metastases, possibly with interval necrosis, given ?progressive enlargement over time. ?2. Unchanged retrosternal/epicardial lymph node or soft tissue ?nodule. Slight interval enlargement of an additional subcentimeter ?epicardial lymph node or soft tissue nodule overlying the pulmonary ?outflow tract. Slight interval enlargement of subcentimeter ?prevascular lymph nodes. These are likewise highly concerning for ?small nodal metastases. ?3. Unchanged, prominent subcentimeter left retroperitoneal lymph ?nodes, nonspecific and less worrisome for nodal metastatic disease. ?Attention on follow-up. ?4. Unchanged post treatment appearance of the left lung, with volume ?loss, areas of rounded atelectasis, and a small, loculated left ?pleural effusion with some associated pleural thickening. ?5. Tiny pulmonary nodules of the right upper lobe are unchanged, ?measuring no greater than 0.3 cm, likely benign and incidental. ?Attention on follow-up. ?6. Status post left nephrectomy. ?7. Coronary artery disease ? ?She presents today for image guided right axillary lymph node biopsy for further evaluation.Risks and benefits of procedure was discussed with the patient   including, but not limited to bleeding, infection, damage to adjacent structures or low yield requiring additional tests. ? ?All of the questions were answered and there is agreement to proceed. ? ?Consent signed and in  chart. ? ? ? ?Electronically Signed: ?Autumn Messing, PA-C ?05/22/2021, 11:53 AM ? ? ?I spent a total of 20 minutes at the the patient's bedside AND on the patient's hospital floor or unit, greater than 50% of which was counseling/coordinating care for image guided right axillary lymph node biopsy ? ? ? ? ? ?

## 2021-05-22 NOTE — Procedures (Signed)
Interventional Radiology Procedure Note ? ?Procedure: US guided right axillary lymph node biopsy ? ?Indication: Lymphadenopathy. Lung Ca. ? ?Findings: Please refer to procedural dictation for full description. ? ?Complications: None ? ?EBL: < 10 mL ? ?Miachel Roux, MD ?[phone] ? ? ?

## 2021-05-22 NOTE — Discharge Instructions (Addendum)
There are no changes to your home medications ? ? ? ?Interventional radiology phone numbers ?[phone] ?After hours [phone] ? ?Needle Biopsy, Care After ?These instructions tell you how to care for yourself after your procedure. Your doctor may also give you more specific instructions. Call your doctor if you have any problems or questions. ?What can I expect after the procedure? ?After the procedure, it is common to have: ?Soreness. ?Bruising. ?Mild pain. ?Follow these instructions at home: ?Return to your normal activities as told by your doctor. Ask your doctor what activities are safe for you. ?Take over-the-counter and prescription medicines only as told by your doctor. ?Wash your hands with soap and water before you change your bandage (dressing). If you cannot use soap and water, use hand sanitizer. ?Follow instructions from your doctor about: ?How to take care of your puncture site. ?When and how to change your bandage. ?When to remove your bandage. ?Check your puncture site every day for signs of infection. Watch for: ?Redness, swelling, or pain. ?Fluid or blood. ?Pus or a bad smell. ?Warmth. ?Do not take baths, swim, or use a hot tub until your doctor approves. You may remove your dressing tomorrow and shower. ?Keep all follow-up visits as told by your doctor. This is important.   ?Contact a doctor if you have: ?A fever. ?Redness, swelling, or pain at the puncture site, and it lasts longer than a few days. ?Fluid, blood, or pus coming from the puncture site. ?Warmth coming from the puncture site. ?Get help right away if: ?You have a lot of bleeding from the puncture site. ?Summary ?After the procedure, it is common to have soreness, bruising, or mild pain at the puncture site. ?Check your puncture site every day for signs of infection, such as redness, swelling, or pain. ?Get help right away if you have severe bleeding from your puncture site. ?This information is not intended to replace advice  given to you by your health care provider. Make sure you discuss any questions you have with your health care provider. ?Document Revised: 08/24/2019 Document Reviewed: 08/24/2019 ?Elsevier Patient Education ? [place]. ? ? ? ? ?Moderate Conscious Sedation, Adult, Care After ?This sheet gives you information about how to care for yourself after your procedure. Your health care provider may also give you more specific instructions. If you have problems or questions, contact your health care provider. ?What can I expect after the procedure? ?After the procedure, it is common to have: ?Sleepiness for several hours. ?Impaired judgment for several hours. ?Difficulty with balance. ?Vomiting if you eat too soon. ?Follow these instructions at home: ?For the time period you were told by your health care provider: ?Rest. ?Do not participate in activities where you could fall or become injured. ?Do not drive or use machinery. ?Do not drink alcohol. ?Do not take sleeping pills or medicines that cause drowsiness. ?Do not make important decisions or sign legal documents. ?Do not take care of children on your own.  ?  ?  ?Eating and drinking ?Follow the diet recommended by your health care provider. ?Drink enough fluid to keep your urine pale yellow. ?If you vomit: ?Drink water, juice, or soup when you can drink without vomiting. ?Make sure you have little or no nausea before eating solid foods.   ?General instructions ?Take over-the-counter and prescription medicines only as told by your health care provider. ?Have a responsible adult stay with you for the time you are told. It is important to have someone help care  for you until you are awake and alert. ?Do not smoke. ?Keep all follow-up visits as told by your health care provider. This is important. ?Contact a health care provider if: ?You are still sleepy or having trouble with balance after 24 hours. ?You feel light-headed. ?You keep feeling nauseous or you keep  vomiting. ?You develop a rash. ?You have a fever. ?You have redness or swelling around the IV site. ?Get help right away if: ?You have trouble breathing. ?You have new-onset confusion at home. ?Summary ?After the procedure, it is common to feel sleepy, have impaired judgment, or feel nauseous if you eat too soon. ?Rest after you get home. Know the things you should not do after the procedure. ?Follow the diet recommended by your health care provider and drink enough fluid to keep your urine pale yellow. ?Get help right away if you have trouble breathing or new-onset confusion at home. ?This information is not intended to replace advice given to you by your health care provider. Make sure you discuss any questions you have with your health care provider. ?Document Revised: 06/23/2019 Document Reviewed: 01/19/2019 ?Elsevier Patient Education ? [place].  ?

## 2021-05-26 LAB — SURGICAL PATHOLOGY

## 2021-06-03 ENCOUNTER — Inpatient Hospital Stay (HOSPITAL_BASED_OUTPATIENT_CLINIC_OR_DEPARTMENT_OTHER): Payer: Medicare Other | Admitting: Internal Medicine

## 2021-06-03 ENCOUNTER — Encounter: Payer: Self-pay | Admitting: *Deleted

## 2021-06-03 ENCOUNTER — Inpatient Hospital Stay: Payer: Medicare Other

## 2021-06-03 ENCOUNTER — Telehealth: Payer: Self-pay | Admitting: Radiation Oncology

## 2021-06-03 ENCOUNTER — Other Ambulatory Visit: Payer: Self-pay

## 2021-06-03 VITALS — BP 135/66 | HR 57 | Temp 97.7°F | Resp 18 | Ht 64.0 in | Wt 137.3 lb

## 2021-06-03 DIAGNOSIS — C3492 Malignant neoplasm of unspecified part of left bronchus or lung: Secondary | ICD-10-CM

## 2021-06-03 DIAGNOSIS — Z5112 Encounter for antineoplastic immunotherapy: Secondary | ICD-10-CM

## 2021-06-03 DIAGNOSIS — R5383 Other fatigue: Secondary | ICD-10-CM

## 2021-06-03 LAB — CBC WITH DIFFERENTIAL (CANCER CENTER ONLY)
Abs Immature Granulocytes: 0.01 10*3/uL (ref 0.00–0.07)
Basophils Absolute: 0.1 10*3/uL (ref 0.0–0.1)
Basophils Relative: 1 %
Eosinophils Absolute: 0.2 10*3/uL (ref 0.0–0.5)
Eosinophils Relative: 3 %
HCT: 35.9 % — ABNORMAL LOW (ref 36.0–46.0)
Hemoglobin: 10.9 g/dL — ABNORMAL LOW (ref 12.0–15.0)
Immature Granulocytes: 0 %
Lymphocytes Relative: 14 %
Lymphs Abs: 0.8 10*3/uL (ref 0.7–4.0)
MCH: 26.6 pg (ref 26.0–34.0)
MCHC: 30.4 g/dL (ref 30.0–36.0)
MCV: 87.6 fL (ref 80.0–100.0)
Monocytes Absolute: 0.3 10*3/uL (ref 0.1–1.0)
Monocytes Relative: 5 %
Neutro Abs: 4.3 10*3/uL (ref 1.7–7.7)
Neutrophils Relative %: 77 %
Platelet Count: 241 10*3/uL (ref 150–400)
RBC: 4.1 MIL/uL (ref 3.87–5.11)
RDW: 16.2 % — ABNORMAL HIGH (ref 11.5–15.5)
WBC Count: 5.6 10*3/uL (ref 4.0–10.5)
nRBC: 0 % (ref 0.0–0.2)

## 2021-06-03 LAB — CMP (CANCER CENTER ONLY)
ALT: 35 U/L (ref 0–44)
AST: 28 U/L (ref 15–41)
Albumin: 3.7 g/dL (ref 3.5–5.0)
Alkaline Phosphatase: 68 U/L (ref 38–126)
Anion gap: 6 (ref 5–15)
BUN: 31 mg/dL — ABNORMAL HIGH (ref 8–23)
CO2: 24 mmol/L (ref 22–32)
Calcium: 9.1 mg/dL (ref 8.9–10.3)
Chloride: 108 mmol/L (ref 98–111)
Creatinine: 0.87 mg/dL (ref 0.44–1.00)
GFR, Estimated: >60 mL/min (ref >60)
Glucose, Bld: 129 mg/dL — ABNORMAL HIGH (ref 70–99)
Potassium: 3.6 mmol/L (ref 3.5–5.1)
Sodium: 138 mmol/L (ref 135–145)
Total Bilirubin: 0.2 mg/dL — ABNORMAL LOW (ref 0.3–1.2)
Total Protein: 6.8 g/dL (ref 6.5–8.1)

## 2021-06-03 LAB — TSH: TSH: 3.078 u[IU]/mL (ref 0.308–3.960)

## 2021-06-03 MED ORDER — SODIUM CHLORIDE 0.9 % IV SOLN
350.0000 mg | Freq: Once | INTRAVENOUS | Status: AC
  Administered 2021-06-03: 350 mg via INTRAVENOUS
  Filled 2021-06-03: qty 7

## 2021-06-03 MED ORDER — SODIUM CHLORIDE 0.9 % IV SOLN: Freq: Once | INTRAVENOUS | Status: AC

## 2021-06-03 NOTE — Patient Instructions (Signed)
[facility]  Discharge Instructions: ?Thank you for choosing [facility] to provide your oncology and hematology care.  ? ?If you have a lab appointment with the [facility], please go directly to the [facility] and check in at the registration area. ?  ?Wear comfortable clothing and clothing appropriate for easy access to any Portacath or PICC line.  ? ?We strive to give you quality time with your provider. You may need to reschedule your appointment if you arrive late (15 or more minutes).  Arriving late affects you and other patients whose appointments are after yours.  Also, if you miss three or more appointments without notifying the office, you may be dismissed from the clinic at the provider?s discretion.    ?  ?For prescription refill requests, have your pharmacy contact our office and allow 72 hours for refills to be completed.   ? ?Today you received the following chemotherapy and/or immunotherapy agents: Libtayo    ?  ?To help prevent nausea and vomiting after your treatment, we encourage you to take your nausea medication as directed. ? ?BELOW ARE SYMPTOMS THAT SHOULD BE REPORTED IMMEDIATELY: ?*FEVER GREATER THAN 100.4 F (38 ?C) OR HIGHER ?*CHILLS OR SWEATING ?*NAUSEA AND VOMITING THAT IS NOT CONTROLLED WITH YOUR NAUSEA MEDICATION ?*UNUSUAL SHORTNESS OF BREATH ?*UNUSUAL BRUISING OR BLEEDING ?*URINARY PROBLEMS (pain or burning when urinating, or frequent urination) ?*BOWEL PROBLEMS (unusual diarrhea, constipation, pain near the anus) ?TENDERNESS IN MOUTH AND THROAT WITH OR WITHOUT PRESENCE OF ULCERS (sore throat, sores in mouth, or a toothache) ?UNUSUAL RASH, SWELLING OR PAIN  ?UNUSUAL VAGINAL DISCHARGE OR ITCHING  ? ?Items with * indicate a potential emergency and should be followed up as soon as possible or go to the Emergency Department if any problems should occur. ? ?Please show the CHEMOTHERAPY ALERT CARD or IMMUNOTHERAPY ALERT CARD at check-in to  the Emergency Department and triage nurse. ? ?Should you have questions after your visit or need to cancel or reschedule your appointment, please contact [facility]  Dept: [phone]  and follow the prompts.  Office hours are 8:00 a.m. to 4:30 p.m. Monday - Friday. Please note that voicemails left after 4:00 p.m. may not be returned until the following business day.  We are closed weekends and major holidays. You have access to a nurse at all times for urgent questions. Please call the main number to the clinic Dept: [phone] and follow the prompts. ? ? ?For any non-urgent questions, you may also contact your provider using MyChart. We now offer e-Visits for anyone 36 and older to request care online for non-urgent symptoms. For details visit [email or web address]. ?  ?Also download the MyChart app! Go to the app store, search "MyChart", open the app, select , and log in with your MyChart username and password. ? ?Due to Covid, a mask is required upon entering the hospital/clinic. If you do not have a mask, one will be given to you upon arrival. For doctor visits, patients may have 1 support Gabrielle [name] aged 67 or older with them. For treatment visits, patients cannot have anyone with them due to current Covid guidelines and our immunocompromised population.  ? ?

## 2021-06-03 NOTE — Progress Notes (Signed)
?    [facility] ?[phone]   [phone] ? ?OFFICE PROGRESS NOTE ? ?[name], NP ?[address] ?[place] [address] ? ?DIAGNOSIS: stage IV (T3, N0, M1 a) non-small cell lung cancer, adenocarcinoma presented with large left lower lobe lung mass in addition to another lingual mass and malignant left pleural effusion diagnosed in September 2021 ? ?Biomarker Findings ?Tumor Mutational Burden - 13 Muts/Mb ?Microsatellite status - MS-Stable ?Genomic Findings ?For a complete list of the genes assayed, please refer to the Appendix. ?KRAS G12C ?PRDM1 D210fs*46 ?TERT promoter -146C>T ?TP53 V157F ?7 Disease relevant genes with no reportable alterations: ALK, BRAF, ?EGFR, ERBB2, MET, RET, ROS1  ? ?PDL1 Expression: 95% ? ?PRIOR THERAPY: None. ? ?CURRENT THERAPY: Libtayo (Cempilimab) 350 mg IV every 3 weeks.  First dose January 15, 2020.  Status post 24 cycles. ? ?INTERVAL HISTORY: ?Gabrielle [name] 66 y.o. female returns to the clinic today for follow-up visit accompanied by her husband.  The patient is feeling fine today with no concerning complaints.  She denied having any current chest pain, shortness of breath, cough or hemoptysis.  She denied having any fever or chills.  She has no nausea, vomiting, diarrhea or constipation.  She has no headache or visual changes.  She underwent ultrasound-guided core biopsy of the right axillary lymph node on May 22, 2021 and the final pathology was consistent with metastatic adenocarcinoma of the lung primary.  The patient is here today for evaluation and discussion of her treatment options based on the new findings. ? ?  ?MEDICAL HISTORY: ?Past Medical History:  ?Diagnosis Date  ? Hyperlipidemia   ? Hypertension   ? Pleural effusion   ? Pneumonia   ? Stage IV adenocarcinoma of lung, left ([place])   ? ? ?ALLERGIES:  is allergic to ace inhibitors. ? ?MEDICATIONS:  ?Current Outpatient Medications  ?Medication Sig Dispense Refill  ?  cholecalciferol (VITAMIN D3) 25 MCG (1000 UNIT) tablet Take 2,000 Units by mouth daily.    ? levothyroxine (SYNTHROID) 50 MCG tablet TAKE 1 TABLET BY MOUTH EVERY DAY BEFORE BREAKFAST 90 tablet 0  ? losartan-hydrochlorothiazide (HYZAAR) 100-12.5 MG tablet Take 1 tablet by mouth daily. (Patient not taking: Reported on 04/22/2021)    ? metFORMIN (GLUCOPHAGE) 500 MG tablet Take 500 mg by mouth 2 (two) times daily with a meal.    ? pravastatin (PRAVACHOL) 40 MG tablet Take 40 mg by mouth daily.    ? prochlorperazine (COMPAZINE) 10 MG tablet Take 1 tablet (10 mg total) by mouth every 6 (six) hours as needed for nausea or vomiting. 30 tablet 0  ? sertraline (ZOLOFT) 50 MG tablet Take 75 mg by mouth daily.    ? ?No current facility-administered medications for this visit.  ? ? ?SURGICAL HISTORY:  ?Past Surgical History:  ?Procedure Laterality Date  ? NEPHRECTOMY Left   ? ? ?REVIEW OF SYSTEMS:  Constitutional: negative ?Eyes: negative ?Ears, nose, mouth, throat, and face: negative ?Respiratory: negative ?Cardiovascular: negative ?Gastrointestinal: negative ?Genitourinary:negative ?Integument/breast: negative ?Hematologic/lymphatic: negative ?Musculoskeletal:negative ?Neurological: negative ?Behavioral/Psych: negative ?Endocrine: negative ?Allergic/Immunologic: negative  ? ?PHYSICAL EXAMINATION: General appearance: alert, cooperative, and no distress ?Head: Normocephalic, without obvious abnormality, atraumatic ?Neck: no adenopathy, no JVD, supple, symmetrical, trachea midline, and thyroid not enlarged, symmetric, no tenderness/mass/nodules ?Lymph nodes: Cervical, supraclavicular, and axillary nodes normal. ?Resp: clear to auscultation bilaterally ?Back: symmetric, no curvature. ROM normal. No CVA tenderness. ?Cardio: regular rate and rhythm, S1, S2 normal, no murmur, click, rub or  gallop ?GI: soft, non-tender; bowel sounds normal; no masses,  no organomegaly ?Extremities: extremities normal, atraumatic, no cyanosis or  edema ?Neurologic: Alert and oriented X 3, normal strength and tone. Normal symmetric reflexes. Normal coordination and gait ? ?ECOG PERFORMANCE STATUS: 1 - Symptomatic but completely ambulatory ? ?Blood pressure 135/66, pulse (!) 57, temperature 97.7 ?F (36.5 ?C), temperature source Oral, resp. rate 18, height $RemoveBe'5\' 4"'LoQHpcjEB$  (1.626 m), weight 137 lb 4.8 oz (62.3 kg), SpO2 100 %. ? ?LABORATORY DATA: ?Lab Results  ?Component Value Date  ? WBC 5.6 06/03/2021  ? HGB 10.9 (L) 06/03/2021  ? HCT 35.9 (L) 06/03/2021  ? MCV 87.6 06/03/2021  ? PLT 241 06/03/2021  ? ? ?  Chemistry   ?   ?Component Value Date/Time  ? NA 138 06/03/2021 1005  ? K 3.6 06/03/2021 1005  ? CL 108 06/03/2021 1005  ? CO2 24 06/03/2021 1005  ? BUN 31 (H) 06/03/2021 1005  ? CREATININE 0.87 06/03/2021 1005  ?    ?Component Value Date/Time  ? CALCIUM 9.1 06/03/2021 1005  ? ALKPHOS 68 06/03/2021 1005  ? AST 28 06/03/2021 1005  ? ALT 35 06/03/2021 1005  ? BILITOT 0.2 (L) 06/03/2021 1005  ?  ? ? ? ?RADIOGRAPHIC STUDIES: ?CT Chest W Contrast ? ?Result Date: 05/07/2021 ?CLINICAL DATA:  Metastatic non-small cell lung cancer restaging, ongoing immunotherapy, patient reports swollen lymph nodes in the right axilla EXAM: CT CHEST, ABDOMEN, AND PELVIS WITH CONTRAST TECHNIQUE: Multidetector CT imaging of the chest, abdomen and pelvis was performed following the standard protocol during bolus administration of intravenous contrast. RADIATION DOSE REDUCTION: This exam was performed according to the departmental dose-optimization program which includes automated exposure control, adjustment of the mA and/or kV according to patient size and/or use of iterative reconstruction technique. CONTRAST:  159mL OMNIPAQUE IOHEXOL 300 MG/ML SOLN, additional oral enteric contrast COMPARISON:  02/14/2021 FINDINGS: CT CHEST FINDINGS Cardiovascular: Aortic atherosclerosis. Normal heart size. Three-vessel coronary artery calcifications. No pericardial effusion. Mediastinum/Nodes: Continued  enlargement of right axillary lymph nodes, now measuring up to 2.1 x 1.7 cm, previously 1.5 x 1.0 cm, with adjacent fat stranding and internal hypodensity (series 2, image 23). Unchanged retrosternal/epicardial lymph node or soft tissue nodule measuring 1.3 x 1.1 cm (series 2, image 32). Slight enlargement of an additional subcentimeter epicardial lymph node or soft tissue nodule overlying the pulmonary outflow tract, measuring 0.8 x 0.6 cm, previously 0.7 x 0.4 cm (series 2, image 32). Slight interval enlargement of subcentimeter prevascular lymph nodes, measuring up to 0.8 x 0.8 cm, previously 0.8 x 0.5 cm (series 2, image 20). Thyroid gland, trachea, and esophagus demonstrate no significant findings. Lungs/Pleura: Unchanged post treatment appearance of the left lung, with volume loss, areas of rounded atelectasis, and a small, loculated left pleural effusion with some associated pleural thickening. Tiny pulmonary nodules of the right upper lobe are unchanged, measuring no greater than 0.3 cm (series 6, image 47). Musculoskeletal: No chest wall mass or suspicious osseous lesions identified. CT ABDOMEN PELVIS FINDINGS Hepatobiliary: No solid liver abnormality is seen. No gallstones, gallbladder wall thickening, or biliary dilatation. Pancreas: Unremarkable. No pancreatic ductal dilatation or surrounding inflammatory changes. Spleen: Normal in size without significant abnormality. Adrenals/Urinary Tract: Adrenal glands are unremarkable. Status post left nephrectomy. The right kidney is normal, without renal calculi, solid lesion, or hydronephrosis. Bladder is unremarkable. Stomach/Bowel: Stomach is within normal limits. Appendix appears normal. No evidence of bowel wall thickening, distention, or inflammatory changes. Large burden of stool throughout the colon. Vascular/Lymphatic:  Aortic atherosclerosis. Unchanged, prominent subcentimeter left retroperitoneal lymph nodes, measuring up to 1.3 x 0.8 cm (series 2,  image 68). Reproductive: No mass or other abnormality. Other: No abdominal wall hernia or abnormality. No ascites. Musculoskeletal: No acute osseous findings. IMPRESSION: 1. Continued enlargement of right axillary lymph nod

## 2021-06-03 NOTE — Progress Notes (Signed)
Oncology Nurse Navigator Documentation ? ? ?  06/03/2021  ? 10:00 AM 12/28/2019  ?  3:00 PM  ?Oncology Nurse Navigator Flowsheets  ?Abnormal Finding Date  12/22/2019  ?Diagnosis Status  Pending Molecular Studies  ?Planned Course of Treatment  Chemotherapy;Targeted Therapy  ?Navigation Complete Date:  01/01/2020  ?Post Navigation: Continue to Follow Patient?  Yes  ?Navigator Location [facility]  ?Navigator Encounter Type Clinic/MDC Clinic/MDC  ?Multidisiplinary Clinic Date  12/28/2019  ?Multidisiplinary Clinic Type  Thoracic  ?Patient Visit Type MedOnc MedOnc  ?Treatment Phase Treatment Pre-Tx/Tx Discussion  ?Barriers/Navigation Needs Coordination of Care;Education/I spoke to Mr and Ms Montone today. Per Dr. Julien Nordmann, she has a lung mass bx positive for cancer.  His tx recommendation is referral to rad onc for radiation tx.  I completed referral per Dr. Julien Nordmann. I help to explain tx plan to patient.  Cultural Needs;Education  ?Education Other Newly Diagnosed Cancer Education;Other  ?Interventions Coordination of Care;Education;Psycho-Social Support Coordination of Care;Education;Psycho-Social Support  ?Acuity Level 3-Moderate Needs (3-4 Barriers Identified) Level 2-Minimal Needs (1-2 Barriers Identified)  ?Coordination of Care Other Other  ?Education Method Verbal;Other Verbal;Written  ?Time Spent with Patient 30 45  ?  ?

## 2021-06-03 NOTE — Telephone Encounter (Signed)
LVM to schedule consult with Dr. Lisbeth Renshaw ?

## 2021-06-03 NOTE — Patient Instructions (Signed)
External Beam Radiation Therapy, Care After ?This sheet gives you information about how to care for yourself after your procedure. Your health care provider may also give you more specific instructions. If you have problems or questions, contact your health care provider. ?What can I expect after the procedure? ?After the procedure, it is common to have: ?Tiredness (fatigue). ?Red, flaking, dry skin in the treated area. ?A sunburn-like rash on the skin in the treated area. ?Itching in the treated area. ?Other side effects may occur, depending on which part of the body was exposed to radiation and how much radiation was used. These side effects may include: ?Hair loss if the radiation therapy was directed to the head. ?Coughing or difficulty swallowing if the radiation therapy was directed to the head, neck, or chest. ?A type of swelling called lymphedema if the radiation therapy was directed to the head, neck, or chest. ?Nausea, vomiting, or diarrhea if the radiation therapy was directed to the abdomen or pelvis. ?Bladder problems, urinating often, or a lowered ability or desire to have sex (sexual dysfunction). These problems may happen if the radiation therapy was directed to the bladder, kidney, or prostate. ?Memory loss and thinking problems (cognitive changes) if the radiation therapy was directed to the brain. ?Some side effects may show up months to years later. However, most side effects are usually temporary and get better over time. It can take up to 3-4 weeks for you to regain your energy or for side effects to get better. ?Follow these instructions at home: ?Radiation therapy affects everyone differently. Some people do not have side effects. You can take steps at home to help prevent or manage side effects. ?Skin care ? ?Wash your skin with a mild soap as told by your health care provider. Do not scrub or rub your skin. Pat yourself dry. ?Use a mild shampoo and be gentle when washing your hair. ?Apply  gentle lotion or cream to the treated area as told by your health care provider. ?Keep the treated area covered when you are outside. Do not expose treated skin to the sun. ?Avoid scratching the treated area. ?Eating and drinking ?Eat small nutritious meals and snacks regularly during the day. ?Choose bland and soft foods that are easy to eat. ?Follow your health care provider's advice on the type and amount of liquids to drink each day. ?If you have diarrhea, drink plenty of clear fluids. ?General instructions ?Do not use a heating pad or a warm cloth to relieve pain in the treated area. ?Take over-the-counter and prescription medicines only as told by your health care provider. ?Try to maintain your weight during treatment. Ask your health care team for tips. ?Keep all follow-up visits as told by your health care provider. This is important. The visits are usually scheduled 6 weeks to 6 months after radiation therapy. They are needed to determine if the radiation therapy worked as expected. ?Contact a health care provider if: ?You have any of the following in the treated area: ?Pain. ?More redness. ?Open skin or blisters. ?You have a fever. ?You have nausea or vomiting that lasts more than 2 days. ?You have diarrhea that lasts longer than 2 days. ?You lose weight without trying to. ?Get help right away if you: ?Are unable to swallow. ?Have difficulty breathing. ?Have severe vomiting or diarrhea. ?Summary ?After this procedure, it is common to have tiredness (fatigue), skin changes, and other side effects, depending on where the radiation therapy was given. ?Some side effects may show  up months to years later. However, most side effects are usually temporary and get better over time. It can take up to 3-4 weeks for you to regain your energy or for side effects to get better. ?Keep all follow-up visits as told by your health care provider. This is important. The visits are usually scheduled 6 weeks to 6 months after  radiation therapy. ?This information is not intended to replace advice given to you by your health care provider. Make sure you discuss any questions you have with your health care provider. ?Document Revised: 09/04/2020 Document Reviewed: 11/07/2018 ?Elsevier Patient Education ? [place]. ? ?

## 2021-06-09 NOTE — Progress Notes (Signed)
?Radiation Oncology         (336) [phone] ?________________________________ ? ?Name: Gabrielle [name]        MRN: [id number]  ?Date of Service: 06/10/2021 DOB: [birth year] ? ?[id number], Gabrielle Lefort, NP  Curt Bears, MD    ? ?REFERRING PHYSICIAN: Curt Bears, MD ? ? ?DIAGNOSIS: The encounter diagnosis was Adenocarcinoma of left lung, stage 4 ([place]). ? ? ?HISTORY OF PRESENT ILLNESS: Gabrielle [name] is a 66 y.o. female seen at the request of Dr. Julien Nordmann with a history of Stage IV [id number], NSCLC, adenocarcinoma of the LLL. She was diagnosed in September 2021 and has a KRAS mutation and has been on [place] since her diagnosis with well controlled disease. She developed right axillary adenopathy seen on a scan in July 2022 but not prior to that interval. At that time it measured 9 mm. It was noted to enlarge to 1.7 cm by CT in October 2022, then in December 2022 it was 1 cm. Recent restaging scan on 05/06/21 measured the lesion at 2.1 cm. She underwent a biopsy on 05/22/21 that confirmed metastatic adenocarcinoma consistent with her know lung cancer. No other significant progression has been noted, and rather than change her therapy she prefers to consider other options, and she's seen to discuss radiotherapy to the right axilla.  ?  ? ? ? ?PREVIOUS RADIATION THERAPY: No ? ? ?PAST MEDICAL HISTORY:  ?Past Medical History:  ?Diagnosis Date  ? Arthritis   ? Bilateral  ? Hyperlipidemia   ? Hypertension   ? Pleural effusion   ? Pneumonia   ? Stage IV adenocarcinoma of lung, left ([place])   ?   ? ? ?PAST SURGICAL HISTORY: ?Past Surgical History:  ?Procedure Laterality Date  ? NEPHRECTOMY Left   ? ? ? ?FAMILY HISTORY:  ?Family History  ?Problem Relation Age of Onset  ? COPD Mother   ? ? ? ?SOCIAL HISTORY:  reports that she quit smoking about 7 years ago. Her smoking use included cigarettes. She has a 40.00 pack-year smoking history. She has never used smokeless tobacco. She reports current alcohol use. She reports that she does not  use drugs. The patient is married and lives in [place]. She enjoys camping with her husband and their two boxer dogs.  ? ? ?ALLERGIES: Ace inhibitors ? ? ?MEDICATIONS:  ?Current Outpatient Medications  ?Medication Sig Dispense Refill  ? cholecalciferol (VITAMIN D3) 25 MCG (1000 UNIT) tablet Take 2,000 Units by mouth daily.    ? levothyroxine (SYNTHROID) 50 MCG tablet TAKE 1 TABLET BY MOUTH EVERY DAY BEFORE BREAKFAST 90 tablet 0  ? metFORMIN (GLUCOPHAGE) 500 MG tablet Take 500 mg by mouth 2 (two) times daily with a meal.    ? pravastatin (PRAVACHOL) 40 MG tablet Take 40 mg by mouth daily.    ? sertraline (ZOLOFT) 50 MG tablet Take 75 mg by mouth daily.    ? losartan-hydrochlorothiazide (HYZAAR) 100-12.5 MG tablet Take 1 tablet by mouth daily. (Patient not taking: Reported on 04/22/2021)    ? prochlorperazine (COMPAZINE) 10 MG tablet Take 1 tablet (10 mg total) by mouth every 6 (six) hours as needed for nausea or vomiting. (Patient not taking: Reported on 06/10/2021) 30 tablet 0  ? ?No current facility-administered medications for this encounter.  ? ? ? ?REVIEW OF SYSTEMS: On review of systems, the patient reports that she is doing well overall. She denies any pain in her right axilla but can tell it is more full over time in that region. She denies  any edema of the RUE or fullness that can't be seen.  She denies any chest pain, shortness of breath, cough. No other complaints are verbalized.  ? ?PHYSICAL EXAM:  ?Wt Readings from Last 3 Encounters:  ?06/10/21 135 lb 6.4 oz (61.4 kg)  ?06/03/21 137 lb 4.8 oz (62.3 kg)  ?05/13/21 133 lb 11.2 oz (60.6 kg)  ? ?Temp Readings from Last 3 Encounters:  ?06/03/21 97.7 ?F (36.5 ?C) (Oral)  ?05/22/21 98.7 ?F (37.1 ?C) (Oral)  ?05/13/21 (!) 97.3 ?F (36.3 ?C) (Tympanic)  ? ?BP Readings from Last 3 Encounters:  ?06/10/21 140/62  ?06/03/21 135/66  ?05/22/21 113/65  ? ?Pulse Readings from Last 3 Encounters:  ?06/10/21 (!) 54  ?06/03/21 (!) 57  ?05/22/21 (!) 59  ? ?Pain Assessment ?Pain  Score: 0-No pain/10 ? ?In general this is a well appearing caucasian female in no acute distress. She's alert and oriented x4 and appropriate throughout the examination. Cardiopulmonary assessment is negative for acute distress and she exhibits normal effort.  ? ? ? ?ECOG = 0 ? ?0 - Asymptomatic (Fully active, able to carry on all predisease activities without restriction) ? ?1 - Symptomatic but completely ambulatory (Restricted in physically strenuous activity but ambulatory and able to carry out work of a light or sedentary nature. For example, light housework, office work) ? ?2 - Symptomatic, <50% in bed during the day (Ambulatory and capable of all self care but unable to carry out any work activities. Up and about more than 50% of waking hours) ? ?3 - Symptomatic, >50% in bed, but not bedbound (Capable of only limited self-care, confined to bed or chair 50% or more of waking hours) ? ?4 - Bedbound (Completely disabled. Cannot carry on any self-care. Totally confined to bed or chair) ? ?5 - Death ? ? Oken MM, Creech RH, Tormey DC, et al. [phone]). "Toxicity and response criteria of the [facility]". [place] Oncol. 5 (6): 649-55 ? ? ? ?LABORATORY DATA:  ?Lab Results  ?Component Value Date  ? WBC 5.6 06/03/2021  ? HGB 10.9 (L) 06/03/2021  ? HCT 35.9 (L) 06/03/2021  ? MCV 87.6 06/03/2021  ? PLT 241 06/03/2021  ? ?Lab Results  ?Component Value Date  ? NA 138 06/03/2021  ? K 3.6 06/03/2021  ? CL 108 06/03/2021  ? CO2 24 06/03/2021  ? ?Lab Results  ?Component Value Date  ? ALT 35 06/03/2021  ? AST 28 06/03/2021  ? ALKPHOS 68 06/03/2021  ? BILITOT 0.2 (L) 06/03/2021  ? ?  ? ?RADIOGRAPHY: [place] AXILLARY NODE CORE BIOPSY RIGHT ? ?Result Date: 05/22/2021 ?INDICATION: 66 year old woman with history of lung cancer a new right axillary lymphadenopathy presents to IR for ultrasound-guided biopsy of lymph nodes. EXAM: Ultrasound-guided biopsy of right axillary lymph node MEDICATIONS: None.  ANESTHESIA/SEDATION: Moderate (conscious) sedation was employed during this procedure. A total of Versed 2 mg and Fentanyl 100 mcg was administered intravenously by the radiology nurse. Total intra-service moderate Sedation Time: 11 minutes. The patient's level of consciousness and vital signs were monitored continuously by radiology nursing throughout the procedure under my direct supervision. COMPLICATIONS: None immediate. PROCEDURE: Informed written consent was obtained from the patient after a thorough discussion of the procedural risks, benefits and alternatives. All questions were addressed. Maximal Sterile Barrier Technique was utilized including caps, mask, sterile gowns, sterile gloves, sterile drape, hand hygiene and skin antiseptic. A timeout was performed prior to the initiation of the procedure. Patient position supine on the ultrasound table.  Right axillary skin prepped and draped in usual sterile fashion. Following local lidocaine administration, four 18 gauge cores were obtained from the enlarged right axillary lymph node utilizing continuous ultrasound guidance. Samples were sent to pathology in formalin. Needle removed and hemostasis achieved with 2 minutes of manual compression. Post procedure ultrasound images showed no evidence of significant hemorrhage. IMPRESSION: Ultrasound-guided biopsy of right axillary lymph node as above. Electronically Signed   By: Miachel Roux M.D.   On: 05/22/2021 14:08   ?   ? ?IMPRESSION/PLAN: ?1. Progressive Metastatic Stage IV [id number], NSCLC, adenocarcinoma of the LLL. Dr. Lisbeth Renshaw discusses the pathology findings and reviews the nature of local progression and since her systemic therapy has kept other disease stable, Dr. Lisbeth Renshaw is in agreement with offering a palliative course of radiotherapy to the right axilla. We discussed the risks, benefits, short, and long term effects of radiotherapy, as well as the curative intent, and the patient is interested in proceeding.  Dr. Lisbeth Renshaw discusses the delivery and logistics of radiotherapy and anticipates a course of 2 weeks of radiotherapy to the right axilla. Written consent is obtained and placed in the chart, a copy was provided to th

## 2021-06-09 NOTE — Progress Notes (Signed)
Thoracic Location of Tumor / Histology: Stage IV (T3, N0, M1 a) non small cell lung cancer, adenocarcinoma of Left Lower Lobe ? ?Patient presented for follow-up scans for Lung Cancer ? ? ?Biopsies of Right Axillary Lymph Node ? ? ? ?Tobacco/Marijuana/Snuff/ETOH use: Former Smoker, quit in 2016. ? ?Past/Anticipated interventions by cardiothoracic surgery, if any:  ? ? ?Past/Anticipated interventions by medical oncology, if any:  ?Dr. Julien Nordmann 06/03/2021 ?-stage IV (T3, N0, M1 a) non-small cell lung cancer, adenocarcinoma presented with large left lower lobe lung mass in addition to another lingular mass and malignant left pleural effusion diagnosed in September 2021.  The molecular studies showed positive PD-L1 expression of 95%.  The patient also has K-ras G12C mutation which will be an option for the second line treatment. ?-With the high PD-L1 expression I discussed with the patient treatment with single agent Libtayo (Cempilimab) 350 mg IV every 3 weeks for up to 2 years unless the patient has evidence for disease progression or unacceptable toxicity. ?-The patient started the first cycle of this treatment on January 15, 2020. She is status post 24 cycles.  ?-She was found on recent imaging studies to have enlarging right axillary lymph node.  Otherwise she has a stable disease. ?The patient continues her treatment with Libtayo (Cempilimab). ?-She underwent ultrasound-guided core biopsy of the enlarging right axillary lymph node and the final pathology was consistent with metastatic adenocarcinoma of lung primary. ?-I gave the patient the option of palliative radiotherapy to the solitary enlarging right axillary lymph node with continuation of the immunotherapy versus switching her treatment to a second line option with treatment with Lumakras (Sotorasib) or [employer]) since the patient has positive KRAS G12C mutation. ?-The patient is not interested in switching treatment and she would see the radiation  oncology for discussion of the radiotherapy option. ?-I recommended for her to continue her current treatment with Libtayo (Cempilimab) and she will proceed with cycle #25 today. ?-Next infusion 06/24/2021. ? ? ? ?Signs/Symptoms ?Weight changes, if any: No ?Respiratory complaints, if any: Notes some SOB when she is really tired. ?Hemoptysis, if any: No ?Pain issues, if any: No  ?She reports she can feel her lymph nodes are swollen but denies swelling in her arm and underarm.  ? ?SAFETY ISSUES: ?Prior radiation? No ?Pacemaker/ICD? No  ?Possible current pregnancy? Postmenopausal ?Is the patient on methotrexate? No ? ?Current Complaints / other details:   ?

## 2021-06-10 ENCOUNTER — Ambulatory Visit
Admission: RE | Admit: 2021-06-10 | Discharge: 2021-06-10 | Disposition: A | Discharge status: Home / Self Care | Payer: Medicare Other | Source: Ambulatory Visit | Attending: Radiation Oncology | Admitting: Radiation Oncology

## 2021-06-10 ENCOUNTER — Encounter: Payer: Self-pay | Admitting: Radiation Oncology

## 2021-06-10 ENCOUNTER — Other Ambulatory Visit: Payer: Self-pay

## 2021-06-10 VITALS — BP 140/62 | HR 54 | Resp 20 | Ht 64.0 in | Wt 135.4 lb

## 2021-06-10 DIAGNOSIS — I10 Essential (primary) hypertension: Secondary | ICD-10-CM | POA: Diagnosis not present

## 2021-06-10 DIAGNOSIS — Z7984 Long term (current) use of oral hypoglycemic drugs: Secondary | ICD-10-CM | POA: Diagnosis not present

## 2021-06-10 DIAGNOSIS — E785 Hyperlipidemia, unspecified: Secondary | ICD-10-CM | POA: Diagnosis not present

## 2021-06-10 DIAGNOSIS — Z87891 Personal history of nicotine dependence: Secondary | ICD-10-CM | POA: Diagnosis not present

## 2021-06-10 DIAGNOSIS — C3492 Malignant neoplasm of unspecified part of left bronchus or lung: Secondary | ICD-10-CM

## 2021-06-10 DIAGNOSIS — J90 Pleural effusion, not elsewhere classified: Secondary | ICD-10-CM | POA: Insufficient documentation

## 2021-06-10 DIAGNOSIS — R590 Localized enlarged lymph nodes: Secondary | ICD-10-CM | POA: Insufficient documentation

## 2021-06-10 DIAGNOSIS — C3432 Malignant neoplasm of lower lobe, left bronchus or lung: Secondary | ICD-10-CM | POA: Insufficient documentation

## 2021-06-10 DIAGNOSIS — Z79899 Other long term (current) drug therapy: Secondary | ICD-10-CM | POA: Diagnosis not present

## 2021-06-10 HISTORY — DX: Unspecified osteoarthritis, unspecified site: M19.90

## 2021-06-10 NOTE — Addendum Note (Signed)
Encounter addended by: Cori Razor, RN on: 06/10/2021 2:57 PM ? Actions taken: Charge Capture section accepted

## 2021-06-11 ENCOUNTER — Other Ambulatory Visit: Payer: Self-pay | Admitting: Radiation Oncology

## 2021-06-17 NOTE — Progress Notes (Signed)
[name], NP ?[address] ?[place] [address] ? ?DIAGNOSIS: Stage IV (T3, N0, M1 a) non-small cell lung cancer, adenocarcinoma presented with large left lower lobe lung mass in addition to another lingual mass and malignant left pleural effusion diagnosed in September 2021 ?  ?Biomarker Findings ?Tumor Mutational Burden - 13 Muts/Mb ?Microsatellite status - MS-Stable ?Genomic Findings ?For a complete list of the genes assayed, please refer to the Appendix. ?KRAS G12C ?PRDM1 D216f*46 ?TERT promoter -146C>T ?TP53 V157F ?7 Disease relevant genes with no reportable alterations: ALK, BRAF, ?EGFR, ERBB2, MET, RET, ROS1 ?  ?PDL1 Expression: 95% ? ?PRIOR THERAPY: None ? ?CURRENT THERAPY:    ?1) Libtayo (Cempilimab) 350 mg IV every 3 weeks.  First dose January 15, 2020.  Status post 25 cycles ?2) radiation to the enlarging right axillary lymph node under the care of Dr. MLisbeth Renshaw  Last treatment expected on 07/11/2021 ? ?INTERVAL HISTORY: ?Gabrielle [name] 66y.o. female returns to the clinic today for a follow-up visit accompanied by her husband.  The patient is currently undergoing systemic treatment with immunotherapy with Libtayo.  She is status post 25 cycles.  We have been monitoring an enlarging right axillary lymph node.  Her most recent restaging CT scan showed further enlargement of this lymph node and she underwen an ultrasound-guided core biopsy of the right axillary lymph node on May 22, 2021.  The final pathology is consistent metastatic adenocarcinoma of lung primary.  There was no other significant disease progression on her restaging scan from 05/06/21.  To avoid changing her systemic treatment, we refer the patient to radiation oncology for radiation to the right axillary lymph node.  She is expected to start this next week. The last day radiation is tentatively scheduled for 07/11/21 under the care of Dr. MLisbeth Renshaw  She denies any concerning  complaints today.  She denies any fever, chills, or night sweats.  She reports she is good appetite.  She denies any chest pain, cough, or hemoptysis.  She denies any significant shortness of breath.  She denies any nausea, vomiting, recent diarrhea, or constipation.  She denies any headache or visual changes.  She previously had some scattered skin lesions which are likely secondary to her immunotherapy which is better at this time. The patient is here today for evaluation and repeat blood work before starting cycle #26. ? ?MEDICAL HISTORY: ?Past Medical History:  ?Diagnosis Date  ? Arthritis   ? Bilateral  ? Hyperlipidemia   ? Hypertension   ? Pleural effusion   ? Pneumonia   ? Stage IV adenocarcinoma of lung, left ([place]   ? ? ?ALLERGIES:  is allergic to ace inhibitors. ? ?MEDICATIONS:  ?Current Outpatient Medications  ?Medication Sig Dispense Refill  ? cholecalciferol (VITAMIN D3) 25 MCG (1000 UNIT) tablet Take 2,000 Units by mouth daily.    ? levothyroxine (SYNTHROID) 50 MCG tablet TAKE 1 TABLET BY MOUTH EVERY DAY BEFORE BREAKFAST 90 tablet 0  ? metFORMIN (GLUCOPHAGE) 500 MG tablet Take 500 mg by mouth 2 (two) times daily with a meal.    ? pravastatin (PRAVACHOL) 40 MG tablet Take 40 mg by mouth daily.    ? sertraline (ZOLOFT) 50 MG tablet Take 75 mg by mouth daily.    ? losartan-hydrochlorothiazide (HYZAAR) 100-12.5 MG tablet Take 1 tablet by mouth daily. (Patient not taking: Reported on 04/22/2021)    ? ?No current facility-administered medications for this visit.  ? ? ?  SURGICAL HISTORY:  ?Past Surgical History:  ?Procedure Laterality Date  ? NEPHRECTOMY Left   ? ? ?REVIEW OF SYSTEMS:   ?Review of Systems  ?Constitutional: Negative for appetite change, chills, fatigue, fever and unexpected weight change.  ?HENT: Negative for mouth sores, nosebleeds, sore throat and trouble swallowing.   ?Eyes: Negative for eye problems and icterus.  ?Respiratory: Negative for cough, hemoptysis, shortness of breath and wheezing.    ?Cardiovascular: Negative for chest pain and leg swelling.  ?Gastrointestinal: Negative for abdominal pain, constipation, diarrhea, nausea and vomiting.  ?Genitourinary: Negative for bladder incontinence, difficulty urinating, dysuria, frequency and hematuria.   ?Musculoskeletal: Negative for back pain, gait problem, neck pain and neck stiffness.  ?Skin: Negative for itching and rash.  ?Neurological: Negative for dizziness, extremity weakness, gait problem, headaches, light-headedness and seizures.  ?Hematological: Negative for adenopathy. Does not bruise/bleed easily.  ?Psychiatric/Behavioral: Negative for confusion, depression and sleep disturbance. The patient is not nervous/anxious.   ? ? ?PHYSICAL EXAMINATION:  ?Blood pressure 132/68, pulse 62, temperature (!) 97.3 ?F (36.3 ?C), temperature source Tympanic, resp. rate 16, weight 137 lb 4.8 oz (62.3 kg), SpO2 100 %. ? ?ECOG PERFORMANCE STATUS: 1 ? ?Physical Exam  ?Constitutional: Oriented to person, place, and time and well-developed, well-nourished, and in no distress. HENT:  ?Head: Normocephalic and atraumatic.  ?Mouth/Throat: Oropharynx is clear and moist. No oropharyngeal exudate.  ?Eyes: Conjunctivae are normal. Right eye exhibits no discharge. Left eye exhibits no discharge. No scleral icterus.  ?Neck: Normal range of motion. Neck supple.  ?Cardiovascular: Normal rate, regular rhythm, normal heart sounds and intact distal pulses.   ?Pulmonary/Chest: Effort normal and breath sounds normal. No respiratory distress. No wheezes. No rales.  ?Abdominal: Soft. Bowel sounds are normal. Exhibits no distension and no mass. There is no tenderness.  ?Musculoskeletal: Normal range of motion. Exhibits no edema.  ?Lymphadenopathy:  ?Positive for right axillary lymphadenopathy. ?Neurological: Alert and oriented to person, place, and time. Exhibits normal muscle tone. Gait normal. Coordination normal.  ?Skin: Skin is warm and dry. No rash noted. Not diaphoretic. No  erythema. No pallor.  ?Psychiatric: Mood, memory and judgment normal.  ?Vitals reviewed. ? ?LABORATORY DATA: ?Lab Results  ?Component Value Date  ? WBC 6.0 06/24/2021  ? HGB 10.2 (L) 06/24/2021  ? HCT 32.2 (L) 06/24/2021  ? MCV 86.8 06/24/2021  ? PLT 246 06/24/2021  ? ? ?  Chemistry   ?   ?Component Value Date/Time  ? NA 140 06/24/2021 1353  ? K 3.8 06/24/2021 1353  ? CL 110 06/24/2021 1353  ? CO2 26 06/24/2021 1353  ? BUN 14 06/24/2021 1353  ? CREATININE 0.86 06/24/2021 1353  ?    ?Component Value Date/Time  ? CALCIUM 8.7 (L) 06/24/2021 1353  ? ALKPHOS 57 06/24/2021 1353  ? AST 13 (L) 06/24/2021 1353  ? ALT 12 06/24/2021 1353  ? BILITOT 0.2 (L) 06/24/2021 1353  ?  ? ? ? ?RADIOGRAPHIC STUDIES: ? ?No results found. ? ? ?ASSESSMENT/PLAN:  ?This is a very pleasant 66 year old Caucasian female diagnosed with stage IV (T3, N0, M1A) non-small cell lung cancer, adenocarcinoma.  She presented with a large left lower lobe lung mass in addition to another lingular mass and a malignant left pleural effusion.  She was diagnosed in September 2021.  Her molecular studies show that she has a PD-L1 expression at 95%.  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 ?  ?The patient is currently  undergoing treatment with single agent Libtayo (Cempilimab) 350 mg IV every 3 weeks for up to 2 years unless the patient has evidence for disease progression or unacceptable toxicity.  She is status post 25 cycles and tolerated it well without any concerning adverse side effects.  ? ?The patient will be undergoing radiation to the enlarging right axillary lymph node under the care of Dr. Lisbeth Renshaw in the next few days. Her last day of radiation is scheduled for 07/11/21.  ? ?Labs were reviewed. Recommend that she proceed  with cycle #26 today as scheduled.  ? ?We will see her back for a follow up visit in 3 weeks before starting cycle #27.  ? ?If she had disease progression in the future, Dr. Julien Nordmann  will likely consider Lumakras or Alfred Levins due to her KRASG12C mutation. We will likely arrange for a restaging CT scan at her next appointment on 07/15/21.  ? ?The patient was advised to call immediately if she h

## 2021-06-20 ENCOUNTER — Ambulatory Visit
Admission: RE | Admit: 2021-06-20 | Discharge: 2021-06-20 | Disposition: A | Discharge status: Home / Self Care | Payer: Medicare Other | Source: Ambulatory Visit | Attending: Radiation Oncology | Admitting: Radiation Oncology

## 2021-06-20 ENCOUNTER — Other Ambulatory Visit: Payer: Self-pay

## 2021-06-20 DIAGNOSIS — C3432 Malignant neoplasm of lower lobe, left bronchus or lung: Secondary | ICD-10-CM | POA: Insufficient documentation

## 2021-06-20 DIAGNOSIS — Z510 Encounter for antineoplastic radiation therapy: Secondary | ICD-10-CM | POA: Insufficient documentation

## 2021-06-24 ENCOUNTER — Inpatient Hospital Stay (HOSPITAL_BASED_OUTPATIENT_CLINIC_OR_DEPARTMENT_OTHER): Payer: Medicare Other | Admitting: Physician Assistant

## 2021-06-24 ENCOUNTER — Other Ambulatory Visit: Payer: Self-pay

## 2021-06-24 ENCOUNTER — Inpatient Hospital Stay: Payer: Medicare Other

## 2021-06-24 ENCOUNTER — Encounter: Payer: Self-pay | Admitting: Physician Assistant

## 2021-06-24 ENCOUNTER — Inpatient Hospital Stay: Payer: Medicare Other | Attending: Internal Medicine

## 2021-06-24 VITALS — BP 132/68 | HR 62 | Temp 97.3°F | Resp 16 | Wt 137.3 lb

## 2021-06-24 DIAGNOSIS — Z7984 Long term (current) use of oral hypoglycemic drugs: Secondary | ICD-10-CM | POA: Diagnosis not present

## 2021-06-24 DIAGNOSIS — C3492 Malignant neoplasm of unspecified part of left bronchus or lung: Secondary | ICD-10-CM

## 2021-06-24 DIAGNOSIS — Z5112 Encounter for antineoplastic immunotherapy: Secondary | ICD-10-CM

## 2021-06-24 DIAGNOSIS — J910 Malignant pleural effusion: Secondary | ICD-10-CM | POA: Diagnosis not present

## 2021-06-24 DIAGNOSIS — Z905 Acquired absence of kidney: Secondary | ICD-10-CM | POA: Insufficient documentation

## 2021-06-24 DIAGNOSIS — Z79899 Other long term (current) drug therapy: Secondary | ICD-10-CM | POA: Diagnosis not present

## 2021-06-24 DIAGNOSIS — Z923 Personal history of irradiation: Secondary | ICD-10-CM | POA: Insufficient documentation

## 2021-06-24 DIAGNOSIS — C3432 Malignant neoplasm of lower lobe, left bronchus or lung: Secondary | ICD-10-CM | POA: Diagnosis present

## 2021-06-24 DIAGNOSIS — R5383 Other fatigue: Secondary | ICD-10-CM

## 2021-06-24 LAB — CMP (CANCER CENTER ONLY)
ALT: 12 U/L (ref 0–44)
AST: 13 U/L — ABNORMAL LOW (ref 15–41)
Albumin: 3.4 g/dL — ABNORMAL LOW (ref 3.5–5.0)
Alkaline Phosphatase: 57 U/L (ref 38–126)
Anion gap: 4 — ABNORMAL LOW (ref 5–15)
BUN: 14 mg/dL (ref 8–23)
CO2: 26 mmol/L (ref 22–32)
Calcium: 8.7 mg/dL — ABNORMAL LOW (ref 8.9–10.3)
Chloride: 110 mmol/L (ref 98–111)
Creatinine: 0.86 mg/dL (ref 0.44–1.00)
GFR, Estimated: >60 mL/min (ref >60)
Glucose, Bld: 100 mg/dL — ABNORMAL HIGH (ref 70–99)
Potassium: 3.8 mmol/L (ref 3.5–5.1)
Sodium: 140 mmol/L (ref 135–145)
Total Bilirubin: 0.2 mg/dL — ABNORMAL LOW (ref 0.3–1.2)
Total Protein: 6.6 g/dL (ref 6.5–8.1)

## 2021-06-24 LAB — CBC WITH DIFFERENTIAL (CANCER CENTER ONLY)
Abs Immature Granulocytes: 0.03 10*3/uL (ref 0.00–0.07)
Basophils Absolute: 0.1 10*3/uL (ref 0.0–0.1)
Basophils Relative: 1 %
Eosinophils Absolute: 0.3 10*3/uL (ref 0.0–0.5)
Eosinophils Relative: 4 %
HCT: 32.2 % — ABNORMAL LOW (ref 36.0–46.0)
Hemoglobin: 10.2 g/dL — ABNORMAL LOW (ref 12.0–15.0)
Immature Granulocytes: 1 %
Lymphocytes Relative: 14 %
Lymphs Abs: 0.8 10*3/uL (ref 0.7–4.0)
MCH: 27.5 pg (ref 26.0–34.0)
MCHC: 31.7 g/dL (ref 30.0–36.0)
MCV: 86.8 fL (ref 80.0–100.0)
Monocytes Absolute: 0.6 10*3/uL (ref 0.1–1.0)
Monocytes Relative: 9 %
Neutro Abs: 4.3 10*3/uL (ref 1.7–7.7)
Neutrophils Relative %: 71 %
Platelet Count: 246 10*3/uL (ref 150–400)
RBC: 3.71 MIL/uL — ABNORMAL LOW (ref 3.87–5.11)
RDW: 15.7 % — ABNORMAL HIGH (ref 11.5–15.5)
WBC Count: 6 10*3/uL (ref 4.0–10.5)
nRBC: 0 % (ref 0.0–0.2)

## 2021-06-24 LAB — TSH: TSH: 2.762 u[IU]/mL (ref 0.350–4.500)

## 2021-06-24 MED ORDER — SODIUM CHLORIDE 0.9 % IV SOLN
350.0000 mg | Freq: Once | INTRAVENOUS | Status: AC
  Administered 2021-06-24: 350 mg via INTRAVENOUS
  Filled 2021-06-24: qty 7

## 2021-06-24 MED ORDER — SODIUM CHLORIDE 0.9 % IV SOLN: Freq: Once | INTRAVENOUS | Status: AC

## 2021-06-24 NOTE — Patient Instructions (Signed)
Gabrielle [name]   ?Discharge Instructions: ?Thank you for choosing [facility] to provide your oncology and hematology care.  ? ?If you have a lab appointment with the [facility], please go directly to the [facility] and check in at the registration area. ?  ?Wear comfortable clothing and clothing appropriate for easy access to any Portacath or PICC line.  ? ?We strive to give you quality time with your provider. You may need to reschedule your appointment if you arrive late (15 or more minutes).  Arriving late affects you and other patients whose appointments are after yours.  Also, if you miss three or more appointments without notifying the office, you may be dismissed from the clinic at the provider?s discretion.    ?  ?For prescription refill requests, have your pharmacy contact our office and allow 72 hours for refills to be completed.   ? ?Today you received the following chemotherapy and/or immunotherapy agents: Cemiplimab (Libtayo)    ?  ?To help prevent nausea and vomiting after your treatment, we encourage you to take your nausea medication as directed. ? ?BELOW ARE SYMPTOMS THAT SHOULD BE REPORTED IMMEDIATELY: ?*FEVER GREATER THAN 100.4 F (38 ?C) OR HIGHER ?*CHILLS OR SWEATING ?*NAUSEA AND VOMITING THAT IS NOT CONTROLLED WITH YOUR NAUSEA MEDICATION ?*UNUSUAL SHORTNESS OF BREATH ?*UNUSUAL BRUISING OR BLEEDING ?*URINARY PROBLEMS (pain or burning when urinating, or frequent urination) ?*BOWEL PROBLEMS (unusual diarrhea, constipation, pain near the anus) ?TENDERNESS IN MOUTH AND THROAT WITH OR WITHOUT PRESENCE OF ULCERS (sore throat, sores in mouth, or a toothache) ?UNUSUAL RASH, SWELLING OR PAIN  ?UNUSUAL VAGINAL DISCHARGE OR ITCHING  ? ?Items with * indicate a potential emergency and should be followed up as soon as possible or go to the Emergency Department if any problems should occur. ? ?Please show the CHEMOTHERAPY ALERT CARD or IMMUNOTHERAPY ALERT CARD at  check-in to the Emergency Department and triage nurse. ? ?Should you have questions after your visit or need to cancel or reschedule your appointment, please contact [facility]  Dept: [phone]  and follow the prompts.  Office hours are 8:00 a.m. to 4:30 p.m. Monday - Friday. Please note that voicemails left after 4:00 p.m. may not be returned until the following business day.  We are closed weekends and major holidays. You have access to a nurse at all times for urgent questions. Please call the main number to the clinic Dept: [phone] and follow the prompts. ? ? ?For any non-urgent questions, you may also contact your provider using MyChart. We now offer e-Visits for anyone 44 and older to request care online for non-urgent symptoms. For details visit [email or web address]. ?  ?Also download the MyChart app! Go to the app store, search "MyChart", open the app, select [facility], and log in with your MyChart username and password. ? ?Due to Covid, a mask is required upon entering the hospital/clinic. If you do not have a mask, one will be given to you upon arrival. For doctor visits, patients may have 1 support person aged 38 or older with them. For treatment visits, patients cannot have anyone with them due to current Covid guidelines and our immunocompromised population.  ? ?

## 2021-06-30 ENCOUNTER — Ambulatory Visit
Admission: RE | Admit: 2021-06-30 | Discharge: 2021-06-30 | Disposition: A | Discharge status: Home / Self Care | Payer: Medicare Other | Source: Ambulatory Visit | Attending: Radiation Oncology | Admitting: Radiation Oncology

## 2021-06-30 ENCOUNTER — Other Ambulatory Visit: Payer: Self-pay

## 2021-06-30 DIAGNOSIS — Z510 Encounter for antineoplastic radiation therapy: Secondary | ICD-10-CM | POA: Diagnosis not present

## 2021-06-30 LAB — RAD ONC ARIA SESSION SUMMARY
Course Elapsed Days: 0
Plan Fractions Treated to Date: 1
Plan Prescribed Dose Per Fraction: 3 Gy
Plan Total Fractions Prescribed: 10
Plan Total Prescribed Dose: 30 Gy
Reference Point Dosage Given to Date: 3 Gy
Reference Point Session Dosage Given: 3 Gy
Session Number: 1

## 2021-07-01 ENCOUNTER — Ambulatory Visit
Admission: RE | Admit: 2021-07-01 | Discharge: 2021-07-01 | Disposition: A | Discharge status: Home / Self Care | Payer: Medicare Other | Source: Ambulatory Visit | Attending: Radiation Oncology | Admitting: Radiation Oncology

## 2021-07-01 ENCOUNTER — Other Ambulatory Visit: Payer: Self-pay

## 2021-07-01 DIAGNOSIS — Z510 Encounter for antineoplastic radiation therapy: Secondary | ICD-10-CM | POA: Diagnosis not present

## 2021-07-01 LAB — RAD ONC ARIA SESSION SUMMARY
Course Elapsed Days: 1
Plan Fractions Treated to Date: 2
Plan Prescribed Dose Per Fraction: 3 Gy
Plan Total Fractions Prescribed: 10
Plan Total Prescribed Dose: 30 Gy
Reference Point Dosage Given to Date: 6 Gy
Reference Point Session Dosage Given: 3 Gy
Session Number: 2

## 2021-07-02 ENCOUNTER — Other Ambulatory Visit: Payer: Self-pay

## 2021-07-02 ENCOUNTER — Ambulatory Visit
Admission: RE | Admit: 2021-07-02 | Discharge: 2021-07-02 | Disposition: A | Discharge status: Home / Self Care | Payer: Medicare Other | Source: Ambulatory Visit | Attending: Radiation Oncology | Admitting: Radiation Oncology

## 2021-07-02 DIAGNOSIS — Z510 Encounter for antineoplastic radiation therapy: Secondary | ICD-10-CM | POA: Diagnosis not present

## 2021-07-02 LAB — RAD ONC ARIA SESSION SUMMARY
Course Elapsed Days: 2
Plan Fractions Treated to Date: 3
Plan Prescribed Dose Per Fraction: 3 Gy
Plan Total Fractions Prescribed: 10
Plan Total Prescribed Dose: 30 Gy
Reference Point Dosage Given to Date: 9 Gy
Reference Point Session Dosage Given: 3 Gy
Session Number: 3

## 2021-07-03 ENCOUNTER — Other Ambulatory Visit: Payer: Self-pay

## 2021-07-03 ENCOUNTER — Ambulatory Visit
Admission: RE | Admit: 2021-07-03 | Discharge: 2021-07-03 | Disposition: A | Discharge status: Home / Self Care | Payer: Medicare Other | Source: Ambulatory Visit | Attending: Radiation Oncology | Admitting: Radiation Oncology

## 2021-07-03 DIAGNOSIS — Z510 Encounter for antineoplastic radiation therapy: Secondary | ICD-10-CM | POA: Diagnosis not present

## 2021-07-03 LAB — RAD ONC ARIA SESSION SUMMARY
Course Elapsed Days: 3
Plan Fractions Treated to Date: 4
Plan Prescribed Dose Per Fraction: 3 Gy
Plan Total Fractions Prescribed: 10
Plan Total Prescribed Dose: 30 Gy
Reference Point Dosage Given to Date: 12 Gy
Reference Point Session Dosage Given: 3 Gy
Session Number: 4

## 2021-07-04 ENCOUNTER — Ambulatory Visit
Admission: RE | Admit: 2021-07-04 | Discharge: 2021-07-04 | Disposition: A | Discharge status: Home / Self Care | Payer: Medicare Other | Source: Ambulatory Visit | Attending: Radiation Oncology | Admitting: Radiation Oncology

## 2021-07-04 ENCOUNTER — Other Ambulatory Visit: Payer: Self-pay

## 2021-07-04 DIAGNOSIS — Z510 Encounter for antineoplastic radiation therapy: Secondary | ICD-10-CM | POA: Diagnosis not present

## 2021-07-04 LAB — RAD ONC ARIA SESSION SUMMARY
Course Elapsed Days: 4
Plan Fractions Treated to Date: 5
Plan Prescribed Dose Per Fraction: 3 Gy
Plan Total Fractions Prescribed: 10
Plan Total Prescribed Dose: 30 Gy
Reference Point Dosage Given to Date: 15 Gy
Reference Point Session Dosage Given: 3 Gy
Session Number: 5

## 2021-07-07 ENCOUNTER — Other Ambulatory Visit: Payer: Self-pay

## 2021-07-07 ENCOUNTER — Ambulatory Visit
Admission: RE | Admit: 2021-07-07 | Discharge: 2021-07-07 | Disposition: A | Discharge status: Home / Self Care | Payer: Medicare Other | Source: Ambulatory Visit | Attending: Radiation Oncology | Admitting: Radiation Oncology

## 2021-07-07 DIAGNOSIS — Z510 Encounter for antineoplastic radiation therapy: Secondary | ICD-10-CM | POA: Insufficient documentation

## 2021-07-07 DIAGNOSIS — C3432 Malignant neoplasm of lower lobe, left bronchus or lung: Secondary | ICD-10-CM | POA: Diagnosis present

## 2021-07-07 LAB — RAD ONC ARIA SESSION SUMMARY
Course Elapsed Days: 7
Plan Fractions Treated to Date: 6
Plan Prescribed Dose Per Fraction: 3 Gy
Plan Total Fractions Prescribed: 10
Plan Total Prescribed Dose: 30 Gy
Reference Point Dosage Given to Date: 18 Gy
Reference Point Session Dosage Given: 3 Gy
Session Number: 6

## 2021-07-08 ENCOUNTER — Other Ambulatory Visit: Payer: Self-pay

## 2021-07-08 ENCOUNTER — Ambulatory Visit
Admission: RE | Admit: 2021-07-08 | Discharge: 2021-07-08 | Disposition: A | Discharge status: Home / Self Care | Payer: Medicare Other | Source: Ambulatory Visit | Attending: Radiation Oncology | Admitting: Radiation Oncology

## 2021-07-08 DIAGNOSIS — Z510 Encounter for antineoplastic radiation therapy: Secondary | ICD-10-CM | POA: Diagnosis not present

## 2021-07-08 LAB — RAD ONC ARIA SESSION SUMMARY
Course Elapsed Days: 8
Plan Fractions Treated to Date: 7
Plan Prescribed Dose Per Fraction: 3 Gy
Plan Total Fractions Prescribed: 10
Plan Total Prescribed Dose: 30 Gy
Reference Point Dosage Given to Date: 21 Gy
Reference Point Session Dosage Given: 3 Gy
Session Number: 7

## 2021-07-09 ENCOUNTER — Other Ambulatory Visit: Payer: Self-pay

## 2021-07-09 ENCOUNTER — Ambulatory Visit
Admission: RE | Admit: 2021-07-09 | Discharge: 2021-07-09 | Disposition: A | Discharge status: Home / Self Care | Payer: Medicare Other | Source: Ambulatory Visit | Attending: Radiation Oncology | Admitting: Radiation Oncology

## 2021-07-09 DIAGNOSIS — Z510 Encounter for antineoplastic radiation therapy: Secondary | ICD-10-CM | POA: Diagnosis not present

## 2021-07-09 LAB — RAD ONC ARIA SESSION SUMMARY
Course Elapsed Days: 9
Plan Fractions Treated to Date: 8
Plan Prescribed Dose Per Fraction: 3 Gy
Plan Total Fractions Prescribed: 10
Plan Total Prescribed Dose: 30 Gy
Reference Point Dosage Given to Date: 24 Gy
Reference Point Session Dosage Given: 3 Gy
Session Number: 8

## 2021-07-10 ENCOUNTER — Other Ambulatory Visit: Payer: Self-pay

## 2021-07-10 ENCOUNTER — Ambulatory Visit
Admission: RE | Admit: 2021-07-10 | Discharge: 2021-07-10 | Disposition: A | Discharge status: Home / Self Care | Payer: Medicare Other | Source: Ambulatory Visit | Attending: Radiation Oncology | Admitting: Radiation Oncology

## 2021-07-10 DIAGNOSIS — Z510 Encounter for antineoplastic radiation therapy: Secondary | ICD-10-CM | POA: Diagnosis not present

## 2021-07-10 LAB — RAD ONC ARIA SESSION SUMMARY
Course Elapsed Days: 10
Plan Fractions Treated to Date: 9
Plan Prescribed Dose Per Fraction: 3 Gy
Plan Total Fractions Prescribed: 10
Plan Total Prescribed Dose: 30 Gy
Reference Point Dosage Given to Date: 27 Gy
Reference Point Session Dosage Given: 3 Gy
Session Number: 9

## 2021-07-11 ENCOUNTER — Encounter: Payer: Self-pay | Admitting: Radiation Oncology

## 2021-07-11 ENCOUNTER — Ambulatory Visit
Admission: RE | Admit: 2021-07-11 | Discharge: 2021-07-11 | Disposition: A | Discharge status: Home / Self Care | Payer: Medicare Other | Source: Ambulatory Visit | Attending: Radiation Oncology | Admitting: Radiation Oncology

## 2021-07-11 ENCOUNTER — Other Ambulatory Visit: Payer: Self-pay

## 2021-07-11 DIAGNOSIS — Z510 Encounter for antineoplastic radiation therapy: Secondary | ICD-10-CM | POA: Diagnosis not present

## 2021-07-11 LAB — RAD ONC ARIA SESSION SUMMARY
Course Elapsed Days: 11
Plan Fractions Treated to Date: 10
Plan Prescribed Dose Per Fraction: 3 Gy
Plan Total Fractions Prescribed: 10
Plan Total Prescribed Dose: 30 Gy
Reference Point Dosage Given to Date: 30 Gy
Reference Point Session Dosage Given: 3 Gy
Session Number: 10

## 2021-07-14 ENCOUNTER — Telehealth: Payer: Self-pay | Admitting: Physician Assistant

## 2021-07-14 NOTE — Telephone Encounter (Signed)
Called patient regarding upcoming appointment, patient is notified. °

## 2021-07-15 ENCOUNTER — Inpatient Hospital Stay: Payer: Medicare Other

## 2021-07-15 ENCOUNTER — Inpatient Hospital Stay: Payer: Medicare Other | Attending: Internal Medicine

## 2021-07-15 ENCOUNTER — Inpatient Hospital Stay (HOSPITAL_BASED_OUTPATIENT_CLINIC_OR_DEPARTMENT_OTHER): Payer: Medicare Other | Admitting: Internal Medicine

## 2021-07-15 VITALS — BP 125/65 | HR 56 | Temp 97.2°F | Resp 15 | Wt 137.4 lb

## 2021-07-15 DIAGNOSIS — R5383 Other fatigue: Secondary | ICD-10-CM

## 2021-07-15 DIAGNOSIS — C3432 Malignant neoplasm of lower lobe, left bronchus or lung: Secondary | ICD-10-CM | POA: Insufficient documentation

## 2021-07-15 DIAGNOSIS — C3490 Malignant neoplasm of unspecified part of unspecified bronchus or lung: Secondary | ICD-10-CM

## 2021-07-15 DIAGNOSIS — J910 Malignant pleural effusion: Secondary | ICD-10-CM | POA: Diagnosis not present

## 2021-07-15 DIAGNOSIS — Z79899 Other long term (current) drug therapy: Secondary | ICD-10-CM | POA: Insufficient documentation

## 2021-07-15 DIAGNOSIS — Z7984 Long term (current) use of oral hypoglycemic drugs: Secondary | ICD-10-CM | POA: Insufficient documentation

## 2021-07-15 DIAGNOSIS — C3492 Malignant neoplasm of unspecified part of left bronchus or lung: Secondary | ICD-10-CM

## 2021-07-15 DIAGNOSIS — Z5112 Encounter for antineoplastic immunotherapy: Secondary | ICD-10-CM | POA: Diagnosis present

## 2021-07-15 DIAGNOSIS — Z905 Acquired absence of kidney: Secondary | ICD-10-CM | POA: Diagnosis not present

## 2021-07-15 LAB — CMP (CANCER CENTER ONLY)
ALT: 16 U/L (ref 0–44)
AST: 17 U/L (ref 15–41)
Albumin: 3.6 g/dL (ref 3.5–5.0)
Alkaline Phosphatase: 67 U/L (ref 38–126)
Anion gap: 8 (ref 5–15)
BUN: 22 mg/dL (ref 8–23)
CO2: 21 mmol/L — ABNORMAL LOW (ref 22–32)
Calcium: 9.2 mg/dL (ref 8.9–10.3)
Chloride: 108 mmol/L (ref 98–111)
Creatinine: 0.85 mg/dL (ref 0.44–1.00)
GFR, Estimated: >60 mL/min (ref >60)
Glucose, Bld: 100 mg/dL — ABNORMAL HIGH (ref 70–99)
Potassium: 4.1 mmol/L (ref 3.5–5.1)
Sodium: 137 mmol/L (ref 135–145)
Total Bilirubin: 0.5 mg/dL (ref 0.3–1.2)
Total Protein: 7.5 g/dL (ref 6.5–8.1)

## 2021-07-15 LAB — CBC WITH DIFFERENTIAL (CANCER CENTER ONLY)
Abs Immature Granulocytes: 0.01 10*3/uL (ref 0.00–0.07)
Basophils Absolute: 0.1 10*3/uL (ref 0.0–0.1)
Basophils Relative: 1 %
Eosinophils Absolute: 0.3 10*3/uL (ref 0.0–0.5)
Eosinophils Relative: 4 %
HCT: 34.6 % — ABNORMAL LOW (ref 36.0–46.0)
Hemoglobin: 10.5 g/dL — ABNORMAL LOW (ref 12.0–15.0)
Immature Granulocytes: 0 %
Lymphocytes Relative: 10 %
Lymphs Abs: 0.7 10*3/uL (ref 0.7–4.0)
MCH: 26.1 pg (ref 26.0–34.0)
MCHC: 30.3 g/dL (ref 30.0–36.0)
MCV: 86.1 fL (ref 80.0–100.0)
Monocytes Absolute: 0.5 10*3/uL (ref 0.1–1.0)
Monocytes Relative: 6 %
Neutro Abs: 5.7 10*3/uL (ref 1.7–7.7)
Neutrophils Relative %: 79 %
Platelet Count: 291 10*3/uL (ref 150–400)
RBC: 4.02 MIL/uL (ref 3.87–5.11)
RDW: 14.8 % (ref 11.5–15.5)
WBC Count: 7.3 10*3/uL (ref 4.0–10.5)
nRBC: 0 % (ref 0.0–0.2)

## 2021-07-15 LAB — TSH: TSH: 2.914 u[IU]/mL (ref 0.350–4.500)

## 2021-07-15 MED ORDER — SODIUM CHLORIDE 0.9 % IV SOLN
350.0000 mg | Freq: Once | INTRAVENOUS | Status: AC
  Administered 2021-07-15: 350 mg via INTRAVENOUS
  Filled 2021-07-15: qty 7

## 2021-07-15 MED ORDER — SODIUM CHLORIDE 0.9 % IV SOLN: Freq: Once | INTRAVENOUS | Status: AC

## 2021-07-15 NOTE — Patient Instructions (Signed)
[facility]   ?Discharge Instructions: ?Thank you for choosing [facility] to provide your oncology and hematology care.  ? ?If you have a lab appointment with the [facility], please go directly to the [facility] and check in at the registration area. ?  ?Wear comfortable clothing and clothing appropriate for easy access to any Portacath or PICC line.  ? ?We strive to give you quality time with your provider. You may need to reschedule your appointment if you arrive late (15 or more minutes).  Arriving late affects you and other patients whose appointments are after yours.  Also, if you miss three or more appointments without notifying the office, you may be dismissed from the clinic at the provider?s discretion.    ?  ?For prescription refill requests, have your pharmacy contact our office and allow 72 hours for refills to be completed.   ? ?Today you received the following chemotherapy and/or immunotherapy agents: Cemiplimab (Libtayo)    ?  ?To help prevent nausea and vomiting after your treatment, we encourage you to take your nausea medication as directed. ? ?BELOW ARE SYMPTOMS THAT SHOULD BE REPORTED IMMEDIATELY: ?*FEVER GREATER THAN 100.4 F (38 ?C) OR HIGHER ?*CHILLS OR SWEATING ?*NAUSEA AND VOMITING THAT IS NOT CONTROLLED WITH YOUR NAUSEA MEDICATION ?*UNUSUAL SHORTNESS OF BREATH ?*UNUSUAL BRUISING OR BLEEDING ?*URINARY PROBLEMS (pain or burning when urinating, or frequent urination) ?*BOWEL PROBLEMS (unusual diarrhea, constipation, pain near the anus) ?TENDERNESS IN MOUTH AND THROAT WITH OR WITHOUT PRESENCE OF ULCERS (sore throat, sores in mouth, or a toothache) ?UNUSUAL RASH, SWELLING OR PAIN  ?UNUSUAL VAGINAL DISCHARGE OR ITCHING  ? ?Items with * indicate a potential emergency and should be followed up as soon as possible or go to the Emergency Department if any problems should occur. ? ?Please show the CHEMOTHERAPY ALERT CARD or IMMUNOTHERAPY ALERT CARD at  check-in to the Emergency Department and triage nurse. ? ?Should you have questions after your visit or need to cancel or reschedule your appointment, please contact [facility]  Dept: [phone]  and follow the prompts.  Office hours are 8:00 a.m. to 4:30 p.m. Monday - Friday. Please note that voicemails left after 4:00 p.m. may not be returned until the following business day.  We are closed weekends and major holidays. You have access to a nurse at all times for urgent questions. Please call the main number to the clinic Dept: [phone] and follow the prompts. ? ? ?For any non-urgent questions, you may also contact your provider using MyChart. We now offer e-Visits for anyone 60 and older to request care online for non-urgent symptoms. For details visit [email or web address]. ?  ?Also download the MyChart app! Go to the app store, search "MyChart", open the app, select [facility], and log in with your MyChart username and password. ? ?Due to Covid, a mask is required upon entering the hospital/clinic. If you do not have a mask, one will be given to you upon arrival. For doctor visits, patients may have 1 support person aged 43 or older with them. For treatment visits, patients cannot have anyone with them due to current Covid guidelines and our immunocompromised population.  ? ?

## 2021-07-15 NOTE — Progress Notes (Signed)
?    Gabrielle [name] ?Telephone:([phone]   [phone] ? ?OFFICE PROGRESS NOTE ? ?[name], NP ?[address] ?[place] [address] ? ?DIAGNOSIS: stage IV (T3, N0, M1 a) non-small cell lung cancer, adenocarcinoma presented with large left lower lobe lung mass in addition to another lingual mass and malignant left pleural effusion diagnosed in September 2021 ? ?Biomarker Findings ?Tumor Mutational Burden - 13 Muts/Mb ?Microsatellite status - MS-Stable ?Genomic Findings ?For a complete list of the genes assayed, please refer to the Appendix. ?KRAS G12C ?PRDM1 D238f*46 ?TERT promoter -146C>T ?TP53 V157F ?7 Disease relevant genes with no reportable alterations: ALK, BRAF, ?EGFR, ERBB2, MET, RET, ROS1  ? ?PDL1 Expression: 95% ? ?PRIOR THERAPY: Palliative radiotherapy to the enlarging right axillary lymphadenopathy under the care of Dr. MLisbeth Renshaw ? ?CURRENT THERAPY: Libtayo (Cempilimab) 350 mg IV every 3 weeks.  First dose January 15, 2020.  Status post 26 cycles. ? ?INTERVAL HISTORY: ?Gabrielle [name] 66y.o. female returns to the clinic today for follow-up visit accompanied by her husband.  The patient has no complaints today.  She tolerated the course of palliative radiotherapy fairly well.  She denied having any current chest pain, shortness of breath, cough or hemoptysis.  She has no nausea, vomiting, diarrhea or constipation.  She has no headache or visual changes.  She has no fever or chills.  She is here today for evaluation before starting cycle #27 of her treatment. ?  ?MEDICAL HISTORY: ?Past Medical History:  ?Diagnosis Date  ? Arthritis   ? Bilateral  ? Hyperlipidemia   ? Hypertension   ? Pleural effusion   ? Pneumonia   ? Stage IV adenocarcinoma of lung, left ([facility]   ? ? ?ALLERGIES:  is allergic to ace inhibitors. ? ?MEDICATIONS:  ?Current Outpatient Medications  ?Medication Sig Dispense Refill  ? cholecalciferol (VITAMIN D3) 25 MCG (1000 UNIT) tablet Take 2,000 Units by  mouth daily.    ? levothyroxine (SYNTHROID) 50 MCG tablet TAKE 1 TABLET BY MOUTH EVERY DAY BEFORE BREAKFAST 90 tablet 0  ? losartan-hydrochlorothiazide (HYZAAR) 100-12.5 MG tablet Take 1 tablet by mouth daily. (Patient not taking: Reported on 04/22/2021)    ? metFORMIN (GLUCOPHAGE) 500 MG tablet Take 500 mg by mouth 2 (two) times daily with a meal.    ? pravastatin (PRAVACHOL) 40 MG tablet Take 40 mg by mouth daily.    ? sertraline (ZOLOFT) 50 MG tablet Take 75 mg by mouth daily.    ? ?No current facility-administered medications for this visit.  ? ? ?SURGICAL HISTORY:  ?Past Surgical History:  ?Procedure Laterality Date  ? NEPHRECTOMY Left   ? ? ?REVIEW OF SYSTEMS:  A comprehensive review of systems was negative.  ? ?PHYSICAL EXAMINATION: General appearance: alert, cooperative, and no distress ?Head: Normocephalic, without obvious abnormality, atraumatic ?Neck: no adenopathy, no JVD, supple, symmetrical, trachea midline, and thyroid not enlarged, symmetric, no tenderness/mass/nodules ?Lymph nodes: Cervical, supraclavicular, and axillary nodes normal. ?Resp: clear to auscultation bilaterally ?Back: symmetric, no curvature. ROM normal. No CVA tenderness. ?Cardio: regular rate and rhythm, S1, S2 normal, no murmur, click, rub or gallop ?GI: soft, non-tender; bowel sounds normal; no masses,  no organomegaly ?Extremities: extremities normal, atraumatic, no cyanosis or edema ? ?ECOG PERFORMANCE STATUS: 1 - Symptomatic but completely ambulatory ? ?Blood pressure 125/65, pulse (!) 56, temperature (!) 97.2 ?F (36.2 ?C), resp. rate 15, weight 137 lb 6.4 oz (62.3 kg), SpO2 100 %. ? ?LABORATORY DATA: ?Lab Results  ?  Component Value Date  ? WBC 6.0 06/24/2021  ? HGB 10.2 (L) 06/24/2021  ? HCT 32.2 (L) 06/24/2021  ? MCV 86.8 06/24/2021  ? PLT 246 06/24/2021  ? ? ?  Chemistry   ?   ?Component Value Date/Time  ? NA 140 06/24/2021 1353  ? K 3.8 06/24/2021 1353  ? CL 110 06/24/2021 1353  ? CO2 26 06/24/2021 1353  ? BUN 14 06/24/2021  1353  ? CREATININE 0.86 06/24/2021 1353  ?    ?Component Value Date/Time  ? CALCIUM 8.7 (L) 06/24/2021 1353  ? ALKPHOS 57 06/24/2021 1353  ? AST 13 (L) 06/24/2021 1353  ? ALT 12 06/24/2021 1353  ? BILITOT 0.2 (L) 06/24/2021 1353  ?  ? ? ? ?RADIOGRAPHIC STUDIES: ?No results found. ? ? ?ASSESSMENT AND PLAN: This is a very pleasant 66 years old white female recently diagnosed with a stage IV (T3, N0, M1 a) non-small cell lung cancer, adenocarcinoma presented with large left lower lobe lung mass in addition to another lingular mass and malignant left pleural effusion diagnosed in September 2021.  The molecular studies showed positive PD-L1 expression of 95%.  The patient also has K-ras G12C mutation which will be an option for the second line treatment. ?With the high PD-L1 expression I discussed with the patient treatment with single agent Libtayo (Cempilimab) 350 mg IV every 3 weeks for up to 2 years unless the patient has evidence for disease progression or unacceptable toxicity. ?The patient started the first cycle of this treatment on January 15, 2020. She is status post 26 cycles.  ?She has been tolerating her treatment with Libtayo (Cempilimab) fairly well. ?I recommended for her to proceed with cycle #27 today as planned. ?For the enlarging right axillary lymph node, the patient underwent palliative radiotherapy to this area. ?I recommended for the patient to have repeat CT scan of the chest, abdomen pelvis in 3 weeks before the next cycle of her treatment for restaging of her disease. ?The patient was advised to call immediately if she has any other concerning symptoms in the interval. ?The patient voices understanding of current disease status and treatment options and is in agreement with the current care plan. ? ?All questions were answered. The patient knows to call the clinic with any problems, questions or concerns. We can certainly see the patient much sooner if necessary. ? ? ?Disclaimer: This note was  dictated with voice recognition software. Similar sounding words can inadvertently be transcribed and may not be corrected upon review. ? ? ?  ?  ?

## 2021-07-28 ENCOUNTER — Encounter (HOSPITAL_COMMUNITY): Payer: Self-pay

## 2021-07-28 ENCOUNTER — Ambulatory Visit (HOSPITAL_COMMUNITY)
Admission: RE | Admit: 2021-07-28 | Discharge: 2021-07-28 | Disposition: A | Discharge status: Home / Self Care | Payer: Medicare Other | Source: Ambulatory Visit | Attending: Internal Medicine | Admitting: Internal Medicine

## 2021-07-28 DIAGNOSIS — C3490 Malignant neoplasm of unspecified part of unspecified bronchus or lung: Secondary | ICD-10-CM | POA: Insufficient documentation

## 2021-07-28 MED ORDER — SODIUM CHLORIDE (PF) 0.9 % IJ SOLN
INTRAMUSCULAR | Status: AC
  Filled 2021-07-28: qty 50

## 2021-07-28 MED ORDER — IOHEXOL 300 MG/ML  SOLN
100.0000 mL | Freq: Once | INTRAMUSCULAR | Status: AC | PRN
  Administered 2021-07-28: 100 mL via INTRAVENOUS

## 2021-07-29 ENCOUNTER — Encounter: Payer: Self-pay | Admitting: Internal Medicine

## 2021-07-29 NOTE — Progress Notes (Signed)
                                                                                                                                                             Patient Name: Gabrielle [name] MRN: [id number] DOB: 23-Oct-1955 Referring Physician: Curt Bears (Profile Not Attached) Date of Service: 07/11/2021 [facility], [place]                                                        End Of Treatment Note  Diagnoses: C34.32-Malignant neoplasm of lower lobe, left bronchus or lung  Cancer Staging:  Progressive Metastatic Stage IV [id number], NSCLC, adenocarcinoma of the LLL with right axillary disease.  Intent: Palliative  Radiation Treatment Dates: 06/30/2021 through 07/11/2021 Site Technique Total Dose (Gy) Dose per Fx (Gy) Completed Fx Beam Energies  Chest, Right: Chest_R_axilla 3D 30/30 3 10/10 10X   Narrative: The patient tolerated radiation therapy relatively well. She developed fatigue but did not complain of other symptoms during therapy.    Plan: The patient will receive a call in about one month from the radiation oncology department. She will continue follow up with Dr. Earlie Server as well.   ________________________________________________    Carola Rhine, [facility]

## 2021-07-30 NOTE — Progress Notes (Signed)
[facility] OFFICE PROGRESS NOTE  [name], NP [address] [place] [address]  DIAGNOSIS:  Stage IV (T3, N0, M1 a) non-small cell lung cancer, adenocarcinoma presented with large left lower lobe lung mass in addition to another lingual mass and malignant left pleural effusion diagnosed in September 2021   Biomarker Findings Tumor Mutational Burden - 13 Muts/Mb Microsatellite status - MS-Stable Genomic Findings For a complete list of the genes assayed, please refer to the Appendix. KRAS G12C PRDM1 D268f*46 TERT promoter -146C>T TP53 V157F 7 Disease relevant genes with no reportable alterations: ALK, BRAF, EGFR, ERBB2, MET, RET, ROS1   PDL1 Expression: 95%  PRIOR THERAPY: Palliative radiotherapy to the enlarging right axillary lymphadenopathy under the care of Dr. MLisbeth Renshaw Completed on 07/11/21  CURRENT THERAPY: Libtayo (Cempilimab) 350 mg IV every 3 weeks.  First dose January 15, 2020.  Status post 27 cycles.  INTERVAL HISTORY: Gabrielle DEVORE66y.o. female returns to the clinic today for a follow-up visit accompanied by her husband.  The patient is currently undergoing systemic treatment with immunotherapy with Libtayo.  She is status post 25 cycles.  We have been monitoring an enlarging right axillary lymph node.  Her most recent restaging CT scan showed further enlargement of this lymph node and she underwent an ultrasound-guided core biopsy of the right axillary lymph node on May 22, 2021.  The final pathology is consistent metastatic adenocarcinoma of lung primary.  There was no other significant disease progression on her restaging scan from 05/06/21.  To avoid changing her systemic treatment, we refer the patient to radiation oncology for radiation to the right axillary lymph node. She completed radiation on 07/11/21 under the care of Dr. MLisbeth Renshaw   Otherwise, she is currently undergoing libtayo and tolerating it well. Today, she denies any new  concerning complaints except her and her husband recently were diagnosed with a viral infection. Her husband had bronchitis for 3 weeks but is feeling better. The patient recently caught his infection and has had mild cough and nasal congestion the last few days. She takes vicks, honey, and dayquil. She denies shortness of breath, fevers, chills, myalgias, nausea/vomiting. Her symptoms are slowly improving. She denies any concerning complaints today.  She denies any night sweats.  She reports she is good appetite.  She denies any chest pain or hemoptysis. She denies any nausea, vomiting, recent diarrhea, or constipation.  She denies any headache or visual changes.  She previously had some scattered skin lesions which are likely secondary to her immunotherapy which is better at this time. She recently had a restaging CT scan performed. The patient is here today for evaluation and to review her scan before starting cycle #28    MEDICAL HISTORY: Past Medical History:  Diagnosis Date   Arthritis    Bilateral   Hyperlipidemia    Hypertension    Pleural effusion    Pneumonia    Stage IV adenocarcinoma of lung, left (HCC)     ALLERGIES:  is allergic to ace inhibitors.  MEDICATIONS:  Current Outpatient Medications  Medication Sig Dispense Refill   azithromycin (ZITHROMAX) 250 MG tablet Take as directed 6 each 0   cholecalciferol (VITAMIN D3) 25 MCG (1000 UNIT) tablet Take 2,000 Units by mouth daily.     levothyroxine (SYNTHROID) 50 MCG tablet TAKE 1 TABLET BY MOUTH EVERY DAY BEFORE BREAKFAST 90 tablet 0   losartan-hydrochlorothiazide (HYZAAR) 100-12.5 MG tablet Take 1 tablet by mouth daily. (Patient not  taking: Reported on 04/22/2021)     metFORMIN (GLUCOPHAGE) 500 MG tablet Take 500 mg by mouth 2 (two) times daily with a meal.     pravastatin (PRAVACHOL) 40 MG tablet Take 40 mg by mouth daily.     sertraline (ZOLOFT) 50 MG tablet Take 75 mg by mouth daily.     No current facility-administered  medications for this visit.    SURGICAL HISTORY:  Past Surgical History:  Procedure Laterality Date   NEPHRECTOMY Left     REVIEW OF SYSTEMS:   Review of Systems  Constitutional: Negative for appetite change, chills, fatigue, fever and unexpected weight change.  HENT: Positive for nasal congestion. Negative for mouth sores, nosebleeds, sore throat and trouble swallowing.   Eyes: Negative for eye problems and icterus.  Respiratory: positive for mild cough. Negative for hemoptysis, shortness of breath, and wheezing.   Cardiovascular: Negative for chest pain and leg swelling.  Gastrointestinal: Negative for abdominal pain, constipation, diarrhea, nausea and vomiting.  Genitourinary: Negative for bladder incontinence, difficulty urinating, dysuria, frequency and hematuria.   Musculoskeletal: Negative for back pain, gait problem, neck pain and neck stiffness.  Skin: Negative for itching and rash.  Neurological: Negative for dizziness, extremity weakness, gait problem, headaches, light-headedness and seizures.  Hematological: Negative for adenopathy. Does not bruise/bleed easily.  Psychiatric/Behavioral: Negative for confusion, depression and sleep disturbance. The patient is not nervous/anxious.     PHYSICAL EXAMINATION:  Blood pressure 131/74, pulse (!) 53, temperature 97.8 F (36.6 C), temperature source Temporal, resp. rate 17, height '5\' 4"'  (1.626 m), weight 133 lb 12.8 oz (60.7 kg), SpO2 100 %.  ECOG PERFORMANCE STATUS: 1  Physical Exam  Constitutional: Oriented to person, place, and time and well-developed, well-nourished, and in no distress.  HENT:  Head: Normocephalic and atraumatic.  Mouth/Throat: Oropharynx is clear and moist. No oropharyngeal exudate. Positive for nasal congestion.  Eyes: Conjunctivae are normal. Right eye exhibits no discharge. Left eye exhibits no discharge. No scleral icterus.  Neck: Normal range of motion. Neck supple.  Cardiovascular: Normal rate,  regular rhythm, normal heart sounds and intact distal pulses.   Pulmonary/Chest: Effort normal and breath sounds normal. No respiratory distress. No wheezes. No rales.  Abdominal: Soft. Bowel sounds are normal. Exhibits no distension and no mass. There is no tenderness.  Musculoskeletal: Normal range of motion. Exhibits no edema.  Lymphadenopathy:    No cervical adenopathy.  Neurological: Alert and oriented to person, place, and time. Exhibits normal muscle tone. Gait normal. Coordination normal.  Skin: Skin is warm and dry. No rash noted. Not diaphoretic. No erythema. No pallor.  Psychiatric: Mood, memory and judgment normal.  Vitals reviewed.  LABORATORY DATA: Lab Results  Component Value Date   WBC 6.7 08/05/2021   HGB 10.7 (L) 08/05/2021   HCT 34.3 (L) 08/05/2021   MCV 86.6 08/05/2021   PLT 258 08/05/2021      Chemistry      Component Value Date/Time   NA 139 08/05/2021 1351   K 3.7 08/05/2021 1351   CL 106 08/05/2021 1351   CO2 28 08/05/2021 1351   BUN 19 08/05/2021 1351   CREATININE 0.97 08/05/2021 1351      Component Value Date/Time   CALCIUM 10.1 08/05/2021 1351   ALKPHOS 72 08/05/2021 1351   AST 11 (L) 08/05/2021 1351   ALT 16 08/05/2021 1351   BILITOT 0.3 08/05/2021 1351       RADIOGRAPHIC STUDIES:  CT Chest W Contrast  Result Date: 07/28/2021 CLINICAL DATA:  Metastatic lung cancer restaging, XRT complete * Tracking Code: BO * EXAM: CT CHEST, ABDOMEN, AND PELVIS WITH CONTRAST TECHNIQUE: Multidetector CT imaging of the chest, abdomen and pelvis was performed following the standard protocol during bolus administration of intravenous contrast. RADIATION DOSE REDUCTION: This exam was performed according to the departmental dose-optimization program which includes automated exposure control, adjustment of the mA and/or kV according to patient size and/or use of iterative reconstruction technique. CONTRAST:  16m OMNIPAQUE IOHEXOL 300 MG/ML SOLN, additional oral  enteric contrast COMPARISON:  05/06/2021 FINDINGS: CT CHEST FINDINGS Cardiovascular: Aortic atherosclerosis. Normal heart size. Three-vessel coronary artery calcifications. No pericardial effusion. Mediastinum/Nodes: Interval decrease in size of previously noted enlarged right axillary lymph nodes, measuring 1.3 x 1.0 cm, previously 2.1 x 1.7 cm (series 2, image 23). Unchanged retrosternal/epicardial lymph node or soft tissue nodule measuring 1.2 x 1.1 cm (series 2, image 33). Unchanged subcentimeter pericardial lymph node or soft tissue nodule, measuring 0.7 cm (series 2, image 33). Resolution of previously prominent prevascular lymph nodes, now significantly subcentimeter (series 2, image 22). No other enlarged lymph nodes in the chest. Small hiatal hernia. Thyroid gland, trachea, and esophagus demonstrate no significant findings. Lungs/Pleura: Unchanged post treatment appearance of the chest, with significant volume loss of the left hemithorax and areas of scarring and or rounded atelectasis of the anterior left upper lobe (series 4, image 51) and dependent left lung base (series 4, image 87). Unchanged, small, loculated left pleural effusion with some associated pleural thickening. Unchanged tiny pulmonary nodules of the right upper lobe, for example a 0.3 cm nodule of the peripheral right upper lobe (series 4, image 35) and a 0.3 cm nodule of the medial right upper lobe (series 4, image 34). Musculoskeletal: No chest wall mass or suspicious osseous lesions identified. CT ABDOMEN PELVIS FINDINGS Hepatobiliary: No solid liver abnormality is seen. No gallstones, gallbladder wall thickening, or biliary dilatation. Pancreas: Unremarkable. No pancreatic ductal dilatation or surrounding inflammatory changes. Spleen: Normal in size without significant abnormality. Adrenals/Urinary Tract: Adrenal glands are unremarkable. Status post left nephrectomy. The right kidney is normal, without renal calculi, solid lesion, or  hydronephrosis. Bladder is unremarkable. Stomach/Bowel: Stomach is within normal limits. Appendix appears normal. No evidence of bowel wall thickening, distention, or inflammatory changes. Vascular/Lymphatic: Aortic atherosclerosis. Unchanged prominent subcentimeter left retroperitoneal lymph nodes, measuring up to 1.4 x 0.8 cm (series 2, image 74). Reproductive: No mass or other abnormality. Other: No abdominal wall hernia or abnormality. No ascites. Musculoskeletal: No acute osseous findings. IMPRESSION: 1. Interval decrease in size of previously noted enlarged right axillary lymph nodes, with stable or slightly improved mediastinal and epicardial nodes or soft tissue nodules. 2. Unchanged post treatment appearance of the chest, with significant volume loss of the left hemithorax and areas of scarring and or rounded atelectasis of the anterior left upper lobe and dependent left lung base. Unchanged, small, loculated left pleural effusion with some associated pleural thickening. 3. Unchanged tiny pulmonary nodules of the right upper lobe, again likely benign and incidental. Attention on follow-up. 4. Unchanged prominent left retroperitoneal lymph nodes. 5. Status post left nephrectomy. 6. Coronary artery disease. Aortic Atherosclerosis (ICD10-I70.0). Electronically Signed   By: ADelanna AhmadiM.D.   On: 07/28/2021 09:23   CT Abdomen Pelvis W Contrast  Result Date: 07/28/2021 CLINICAL DATA:  Metastatic lung cancer restaging, XRT complete * Tracking Code: BO * EXAM: CT CHEST, ABDOMEN, AND PELVIS WITH CONTRAST TECHNIQUE: Multidetector CT imaging of the chest, abdomen and pelvis was performed following the standard  protocol during bolus administration of intravenous contrast. RADIATION DOSE REDUCTION: This exam was performed according to the departmental dose-optimization program which includes automated exposure control, adjustment of the mA and/or kV according to patient size and/or use of iterative reconstruction  technique. CONTRAST:  166m OMNIPAQUE IOHEXOL 300 MG/ML SOLN, additional oral enteric contrast COMPARISON:  05/06/2021 FINDINGS: CT CHEST FINDINGS Cardiovascular: Aortic atherosclerosis. Normal heart size. Three-vessel coronary artery calcifications. No pericardial effusion. Mediastinum/Nodes: Interval decrease in size of previously noted enlarged right axillary lymph nodes, measuring 1.3 x 1.0 cm, previously 2.1 x 1.7 cm (series 2, image 23). Unchanged retrosternal/epicardial lymph node or soft tissue nodule measuring 1.2 x 1.1 cm (series 2, image 33). Unchanged subcentimeter pericardial lymph node or soft tissue nodule, measuring 0.7 cm (series 2, image 33). Resolution of previously prominent prevascular lymph nodes, now significantly subcentimeter (series 2, image 22). No other enlarged lymph nodes in the chest. Small hiatal hernia. Thyroid gland, trachea, and esophagus demonstrate no significant findings. Lungs/Pleura: Unchanged post treatment appearance of the chest, with significant volume loss of the left hemithorax and areas of scarring and or rounded atelectasis of the anterior left upper lobe (series 4, image 51) and dependent left lung base (series 4, image 87). Unchanged, small, loculated left pleural effusion with some associated pleural thickening. Unchanged tiny pulmonary nodules of the right upper lobe, for example a 0.3 cm nodule of the peripheral right upper lobe (series 4, image 35) and a 0.3 cm nodule of the medial right upper lobe (series 4, image 34). Musculoskeletal: No chest wall mass or suspicious osseous lesions identified. CT ABDOMEN PELVIS FINDINGS Hepatobiliary: No solid liver abnormality is seen. No gallstones, gallbladder wall thickening, or biliary dilatation. Pancreas: Unremarkable. No pancreatic ductal dilatation or surrounding inflammatory changes. Spleen: Normal in size without significant abnormality. Adrenals/Urinary Tract: Adrenal glands are unremarkable. Status post left  nephrectomy. The right kidney is normal, without renal calculi, solid lesion, or hydronephrosis. Bladder is unremarkable. Stomach/Bowel: Stomach is within normal limits. Appendix appears normal. No evidence of bowel wall thickening, distention, or inflammatory changes. Vascular/Lymphatic: Aortic atherosclerosis. Unchanged prominent subcentimeter left retroperitoneal lymph nodes, measuring up to 1.4 x 0.8 cm (series 2, image 74). Reproductive: No mass or other abnormality. Other: No abdominal wall hernia or abnormality. No ascites. Musculoskeletal: No acute osseous findings. IMPRESSION: 1. Interval decrease in size of previously noted enlarged right axillary lymph nodes, with stable or slightly improved mediastinal and epicardial nodes or soft tissue nodules. 2. Unchanged post treatment appearance of the chest, with significant volume loss of the left hemithorax and areas of scarring and or rounded atelectasis of the anterior left upper lobe and dependent left lung base. Unchanged, small, loculated left pleural effusion with some associated pleural thickening. 3. Unchanged tiny pulmonary nodules of the right upper lobe, again likely benign and incidental. Attention on follow-up. 4. Unchanged prominent left retroperitoneal lymph nodes. 5. Status post left nephrectomy. 6. Coronary artery disease. Aortic Atherosclerosis (ICD10-I70.0). Electronically Signed   By: ADelanna AhmadiM.D.   On: 07/28/2021 09:23     ASSESSMENT/PLAN:  This is a very pleasant 66year old Caucasian female diagnosed with stage IV (T3, N0, M1A) non-small cell lung cancer, adenocarcinoma.  She presented with a large left lower lobe lung mass in addition to another lingular mass and a malignant left pleural effusion.  She was diagnosed in September 2021.  Her molecular studies show that she has a PD-L1 expression at 95%.  The patient also has a KRAS G12 C mutation which will be  an option in the second line setting.   The patient is currently  undergoing treatment with single agent Libtayo (Cempilimab) 350 mg IV every 3 weeks for up to 2 years unless the patient has evidence for disease progression or unacceptable toxicity.  She is status post 27 cycles and tolerated it well without any concerning adverse side effects.    The patient underwent radiation to the enlarging right axillary lymph node under the care of Dr. Lisbeth Renshaw in the next few days. Her last day of radiation was on 07/11/21.   She recently had a restaging CT scan performed.  Dr. Julien Nordmann personally and independently reviewed the scan and discussed the results with the patient today.  The scan did not show any evidence of disease progression.  Dr. Julien Nordmann recommends that she proceed with cycle #20 today as scheduled.  Labs were reviewed. Recommend that she proceed  with cycle #28 today as scheduled.   We will see her back for follow-up visit in 3 weeks for evaluation and repeat blood work before starting her next cycle of treatment.  For her URI, Dr. Julien Nordmann recommends a prescription for a zpack. I have sent to there pharmacy. If new or worsening symptoms such as fevers, shortness of breath, worsening cough, etc, advised to call for re-evaluation.   The patient was advised to call immediately if she has any concerning symptoms in the interval. The patient voices understanding of current disease status and treatment options and is in agreement with the current care plan. All questions were answered. The patient knows to call the clinic with any problems, questions or concerns. We can certainly see the patient much sooner if necessary  No orders of the defined types were placed in this encounter.    Wessie Shanks L Kong Packett, PA-C 08/05/21  ADDENDUM: Hematology/Oncology Attending: The face-to-face encounter with the patient today.  I reviewed her records, lab, scan and recommended her care plan.  This is a very pleasant 66 years old white female with a stage IV non-small cell  lung cancer, adenocarcinoma was in September 2021 with PD-L1 expression of 95% and KRAS G12C mutation. And is currently undergoing treatment with immunotherapy with single agent Libtayo (Cempilimab) 350 Mg IV every 3 weeks status post 19 cycles.  She has been tolerating this treatment well with no concerning adverse effects.  Recently she was found to have evidence of enlargement of right axillary lymphadenopathy that was biopsy-proven to be metastatic non-small cell lung cancer, adenocarcinoma and the patient was treated with palliative radiotherapy. She had repeat CT scan of the chest, abdomen pelvis performed recently.  I personally and independently reviewed the scans and discussed the result with the patient and her husband. Her scan showed no concerning findings for disease progression and there was some improvement axillary lymphadenopathy. I recommended for the patient to continue her current treatment with Libtayo (Cempilimab) and she will proceed with cycle #20 today. The patient will come back for follow-up visit in 3 weeks for evaluation before the next cycle of her treatment. She was advised to call immediately if she has any other concerning symptoms in the interval. The total time spent in the appointment was 30 minutes. Disclaimer: This note was dictated with voice recognition software. Similar sounding words can inadvertently be transcribed and may be missed upon review. Eilleen Kempf, MD

## 2021-08-05 ENCOUNTER — Inpatient Hospital Stay (HOSPITAL_BASED_OUTPATIENT_CLINIC_OR_DEPARTMENT_OTHER): Payer: Medicare Other | Admitting: Physician Assistant

## 2021-08-05 ENCOUNTER — Inpatient Hospital Stay: Payer: Medicare Other

## 2021-08-05 ENCOUNTER — Other Ambulatory Visit: Payer: Self-pay

## 2021-08-05 VITALS — BP 131/74 | HR 53 | Temp 97.8°F | Resp 17 | Ht 64.0 in | Wt 133.8 lb

## 2021-08-05 DIAGNOSIS — R5383 Other fatigue: Secondary | ICD-10-CM

## 2021-08-05 DIAGNOSIS — Z5112 Encounter for antineoplastic immunotherapy: Secondary | ICD-10-CM | POA: Diagnosis not present

## 2021-08-05 DIAGNOSIS — J019 Acute sinusitis, unspecified: Secondary | ICD-10-CM

## 2021-08-05 DIAGNOSIS — C3492 Malignant neoplasm of unspecified part of left bronchus or lung: Secondary | ICD-10-CM

## 2021-08-05 LAB — CBC WITH DIFFERENTIAL (CANCER CENTER ONLY)
Abs Immature Granulocytes: 0.02 10*3/uL (ref 0.00–0.07)
Basophils Absolute: 0.1 10*3/uL (ref 0.0–0.1)
Basophils Relative: 1 %
Eosinophils Absolute: 0.2 10*3/uL (ref 0.0–0.5)
Eosinophils Relative: 3 %
HCT: 34.3 % — ABNORMAL LOW (ref 36.0–46.0)
Hemoglobin: 10.7 g/dL — ABNORMAL LOW (ref 12.0–15.0)
Immature Granulocytes: 0 %
Lymphocytes Relative: 12 %
Lymphs Abs: 0.8 10*3/uL (ref 0.7–4.0)
MCH: 27 pg (ref 26.0–34.0)
MCHC: 31.2 g/dL (ref 30.0–36.0)
MCV: 86.6 fL (ref 80.0–100.0)
Monocytes Absolute: 0.5 10*3/uL (ref 0.1–1.0)
Monocytes Relative: 8 %
Neutro Abs: 5.1 10*3/uL (ref 1.7–7.7)
Neutrophils Relative %: 76 %
Platelet Count: 258 10*3/uL (ref 150–400)
RBC: 3.96 MIL/uL (ref 3.87–5.11)
RDW: 15.3 % (ref 11.5–15.5)
WBC Count: 6.7 10*3/uL (ref 4.0–10.5)
nRBC: 0 % (ref 0.0–0.2)

## 2021-08-05 LAB — CMP (CANCER CENTER ONLY)
ALT: 16 U/L (ref 0–44)
AST: 11 U/L — ABNORMAL LOW (ref 15–41)
Albumin: 3.7 g/dL (ref 3.5–5.0)
Alkaline Phosphatase: 72 U/L (ref 38–126)
Anion gap: 5 (ref 5–15)
BUN: 19 mg/dL (ref 8–23)
CO2: 28 mmol/L (ref 22–32)
Calcium: 10.1 mg/dL (ref 8.9–10.3)
Chloride: 106 mmol/L (ref 98–111)
Creatinine: 0.97 mg/dL (ref 0.44–1.00)
GFR, Estimated: >60 mL/min (ref >60)
Glucose, Bld: 113 mg/dL — ABNORMAL HIGH (ref 70–99)
Potassium: 3.7 mmol/L (ref 3.5–5.1)
Sodium: 139 mmol/L (ref 135–145)
Total Bilirubin: 0.3 mg/dL (ref 0.3–1.2)
Total Protein: 7 g/dL (ref 6.5–8.1)

## 2021-08-05 LAB — TSH: TSH: 4.977 u[IU]/mL — ABNORMAL HIGH (ref 0.350–4.500)

## 2021-08-05 MED ORDER — SODIUM CHLORIDE 0.9 % IV SOLN
350.0000 mg | Freq: Once | INTRAVENOUS | Status: AC
  Administered 2021-08-05: 350 mg via INTRAVENOUS
  Filled 2021-08-05: qty 7

## 2021-08-05 MED ORDER — SODIUM CHLORIDE 0.9 % IV SOLN: Freq: Once | INTRAVENOUS | Status: AC

## 2021-08-05 MED ORDER — AZITHROMYCIN 250 MG PO TABS: ORAL_TABLET | ORAL | 0 refills | Status: DC

## 2021-08-18 ENCOUNTER — Ambulatory Visit
Admission: RE | Admit: 2021-08-18 | Discharge: 2021-08-18 | Disposition: A | Discharge status: Home / Self Care | Payer: Medicare Other | Source: Ambulatory Visit | Attending: Radiation Oncology | Admitting: Radiation Oncology

## 2021-08-18 DIAGNOSIS — C3492 Malignant neoplasm of unspecified part of left bronchus or lung: Secondary | ICD-10-CM | POA: Insufficient documentation

## 2021-08-18 NOTE — Progress Notes (Signed)
  Radiation Oncology         [phone]) [phone] ________________________________  Name: Gabrielle [name] MRN: [id number]  Date of Service: 08/18/2021  DOB: Jun 12, 1955  Post Treatment Telephone Note  Diagnosis:   Progressive Metastatic Stage IV [id number], NSCLC, adenocarcinoma of the LLL with right axillary disease.  Intent: Palliative  Radiation Treatment Dates: 06/30/2021 through 07/11/2021 Site Technique Total Dose (Gy) Dose per Fx (Gy) Completed Fx Beam Energies  Chest, Right: Chest_R_axilla 3D 30/30 3 10/10 10X   Narrative: The patient tolerated radiation therapy relatively well. She developed fatigue but did not complain of other symptoms during therapy. Her CT chest on 07/28/21 showed improvement in her right axillary node finding and no additional disease.   Impression/Plan: 1.  Progressive Metastatic Stage IV [id number], NSCLC, adenocarcinoma of the LLL with right axillary disease.  I was unable to reach the patient but left a voicemail and on the message, I discussed that we would be happy to continue to follow her as needed, but she will also continue to follow up with Dr. Julien Nordmann in medical oncology.        Carola Rhine, PAC

## 2021-08-26 ENCOUNTER — Other Ambulatory Visit: Payer: Self-pay | Admitting: Internal Medicine

## 2021-08-26 ENCOUNTER — Inpatient Hospital Stay (HOSPITAL_BASED_OUTPATIENT_CLINIC_OR_DEPARTMENT_OTHER): Payer: Medicare Other | Admitting: Internal Medicine

## 2021-08-26 ENCOUNTER — Inpatient Hospital Stay: Payer: Medicare Other | Attending: Internal Medicine

## 2021-08-26 ENCOUNTER — Inpatient Hospital Stay: Payer: Medicare Other

## 2021-08-26 ENCOUNTER — Other Ambulatory Visit: Payer: Self-pay

## 2021-08-26 VITALS — BP 126/71 | HR 68 | Temp 97.5°F | Resp 16 | Ht 64.0 in | Wt 133.9 lb

## 2021-08-26 DIAGNOSIS — Z5112 Encounter for antineoplastic immunotherapy: Secondary | ICD-10-CM | POA: Insufficient documentation

## 2021-08-26 DIAGNOSIS — C3492 Malignant neoplasm of unspecified part of left bronchus or lung: Secondary | ICD-10-CM

## 2021-08-26 DIAGNOSIS — Z7984 Long term (current) use of oral hypoglycemic drugs: Secondary | ICD-10-CM | POA: Diagnosis not present

## 2021-08-26 DIAGNOSIS — R5383 Other fatigue: Secondary | ICD-10-CM

## 2021-08-26 DIAGNOSIS — C3432 Malignant neoplasm of lower lobe, left bronchus or lung: Secondary | ICD-10-CM | POA: Insufficient documentation

## 2021-08-26 DIAGNOSIS — Z79899 Other long term (current) drug therapy: Secondary | ICD-10-CM | POA: Diagnosis not present

## 2021-08-26 LAB — CBC WITH DIFFERENTIAL (CANCER CENTER ONLY)
Abs Immature Granulocytes: 0.02 10*3/uL (ref 0.00–0.07)
Basophils Absolute: 0.1 10*3/uL (ref 0.0–0.1)
Basophils Relative: 1 %
Eosinophils Absolute: 0.3 10*3/uL (ref 0.0–0.5)
Eosinophils Relative: 5 %
HCT: 36.3 % (ref 36.0–46.0)
Hemoglobin: 11.4 g/dL — ABNORMAL LOW (ref 12.0–15.0)
Immature Granulocytes: 0 %
Lymphocytes Relative: 9 %
Lymphs Abs: 0.6 10*3/uL — ABNORMAL LOW (ref 0.7–4.0)
MCH: 27.6 pg (ref 26.0–34.0)
MCHC: 31.4 g/dL (ref 30.0–36.0)
MCV: 87.9 fL (ref 80.0–100.0)
Monocytes Absolute: 0.4 10*3/uL (ref 0.1–1.0)
Monocytes Relative: 6 %
Neutro Abs: 5.3 10*3/uL (ref 1.7–7.7)
Neutrophils Relative %: 79 %
Platelet Count: 270 10*3/uL (ref 150–400)
RBC: 4.13 MIL/uL (ref 3.87–5.11)
RDW: 16.6 % — ABNORMAL HIGH (ref 11.5–15.5)
WBC Count: 6.7 10*3/uL (ref 4.0–10.5)
nRBC: 0 % (ref 0.0–0.2)

## 2021-08-26 LAB — CMP (CANCER CENTER ONLY)
ALT: 20 U/L (ref 0–44)
AST: 16 U/L (ref 15–41)
Albumin: 3.8 g/dL (ref 3.5–5.0)
Alkaline Phosphatase: 73 U/L (ref 38–126)
Anion gap: 7 (ref 5–15)
BUN: 19 mg/dL (ref 8–23)
CO2: 23 mmol/L (ref 22–32)
Calcium: 9.5 mg/dL (ref 8.9–10.3)
Chloride: 109 mmol/L (ref 98–111)
Creatinine: 0.96 mg/dL (ref 0.44–1.00)
GFR, Estimated: >60 mL/min (ref >60)
Glucose, Bld: 160 mg/dL — ABNORMAL HIGH (ref 70–99)
Potassium: 3.7 mmol/L (ref 3.5–5.1)
Sodium: 139 mmol/L (ref 135–145)
Total Bilirubin: 0.4 mg/dL (ref 0.3–1.2)
Total Protein: 7.3 g/dL (ref 6.5–8.1)

## 2021-08-26 LAB — TSH: TSH: 4.235 u[IU]/mL (ref 0.350–4.500)

## 2021-08-26 MED ORDER — SODIUM CHLORIDE 0.9 % IV SOLN
350.0000 mg | Freq: Once | INTRAVENOUS | Status: AC
  Administered 2021-08-26: 350 mg via INTRAVENOUS
  Filled 2021-08-26: qty 7

## 2021-08-26 MED ORDER — SODIUM CHLORIDE 0.9 % IV SOLN: Freq: Once | INTRAVENOUS | Status: AC

## 2021-08-26 NOTE — Progress Notes (Signed)
[facility] [phone]   [phone]  OFFICE PROGRESS NOTE  Gabrielle Dandy, NP [place] [address]  DIAGNOSIS: stage IV (T3, N0, M1 a) non-small cell lung cancer, adenocarcinoma presented with large left lower lobe lung mass in addition to another lingual mass and malignant left pleural effusion diagnosed in September 2021  Biomarker Findings Tumor Mutational Burden - 13 Muts/Mb Microsatellite status - MS-Stable Genomic Findings For a complete list of the genes assayed, please refer to the Appendix. KRAS G12C PRDM1 D224f*46 TERT promoter -146C>T TP53 V157F 7 Disease relevant genes with no reportable alterations: ALK, BRAF, EGFR, ERBB2, MET, RET, ROS1   PDL1 Expression: 95%  PRIOR THERAPY: Palliative radiotherapy to the enlarging right axillary lymphadenopathy under the care of Dr. MLisbeth Renshaw  CURRENT THERAPY: Libtayo (Cempilimab) 350 mg IV every 3 weeks.  First dose January 15, 2020.  Status post 28 cycles.  INTERVAL HISTORY: [name] 66y.o. female returns to the clinic today for follow-up visit.  The patient was accompanied by her husband.  She is feeling fine today with no concerning complaints except for arthralgia.  She denied having any chest pain, shortness of breath, cough or hemoptysis.  She has no nausea, vomiting, diarrhea or constipation.  She has no headache or visual changes.  She denied having any significant weight loss or night sweats.  She continues to tolerate her treatment with Libtayo (Cempilimab) fairly well.  The patient is here today for evaluation before starting cycle #29.   MEDICAL HISTORY: Past Medical History:  Diagnosis Date   Arthritis    Bilateral   Hyperlipidemia    Hypertension    Pleural effusion    Pneumonia    Stage IV adenocarcinoma of lung, left (HCC)     ALLERGIES:  is allergic to ace inhibitors.  MEDICATIONS:  Current Outpatient Medications  Medication Sig  Dispense Refill   cholecalciferol (VITAMIN D3) 25 MCG (1000 UNIT) tablet Take 2,000 Units by mouth daily.     levothyroxine (SYNTHROID) 50 MCG tablet TAKE 1 TABLET BY MOUTH EVERY DAY BEFORE BREAKFAST 90 tablet 0   metFORMIN (GLUCOPHAGE) 500 MG tablet Take 500 mg by mouth 2 (two) times daily with a meal.     pravastatin (PRAVACHOL) 40 MG tablet Take 40 mg by mouth daily.     sertraline (ZOLOFT) 50 MG tablet Take 75 mg by mouth daily.     No current facility-administered medications for this visit.    SURGICAL HISTORY:  Past Surgical History:  Procedure Laterality Date   NEPHRECTOMY Left     REVIEW OF SYSTEMS:  A comprehensive review of systems was negative except for: Musculoskeletal: positive for arthralgias   PHYSICAL EXAMINATION: General appearance: alert, cooperative, and no distress Head: Normocephalic, without obvious abnormality, atraumatic Neck: no adenopathy, no JVD, supple, symmetrical, trachea midline, and thyroid not enlarged, symmetric, no tenderness/mass/nodules Lymph nodes: Cervical, supraclavicular, and axillary nodes normal. Resp: clear to auscultation bilaterally Back: symmetric, no curvature. ROM normal. No CVA tenderness. Cardio: regular rate and rhythm, S1, S2 normal, no murmur, click, rub or gallop GI: soft, non-tender; bowel sounds normal; no masses,  no organomegaly Extremities: extremities normal, atraumatic, no cyanosis or edema  ECOG PERFORMANCE STATUS: 1 - Symptomatic but completely ambulatory  Blood pressure 126/71, pulse 68, temperature (!) 97.5 F (36.4 C), temperature source Oral, resp. rate 16, height _0  (1.626 m), weight 133 lb 14.4 oz (60.7 kg), SpO2 100 %.  LABORATORY DATA: Lab Results  Component Value Date   WBC 6.7 08/26/2021   HGB 11.4 (L) 08/26/2021   HCT 36.3 08/26/2021   MCV 87.9 08/26/2021   PLT 270 08/26/2021      Chemistry      Component Value Date/Time   NA 139 08/05/2021 1351   K 3.7 08/05/2021 1351   CL 106 08/05/2021  1351   CO2 28 08/05/2021 1351   BUN 19 08/05/2021 1351   CREATININE 0.97 08/05/2021 1351      Component Value Date/Time   CALCIUM 10.1 08/05/2021 1351   ALKPHOS 72 08/05/2021 1351   AST 11 (L) 08/05/2021 1351   ALT 16 08/05/2021 1351   BILITOT 0.3 08/05/2021 1351       RADIOGRAPHIC STUDIES: CT Chest W Contrast  Result Date: 07/28/2021 CLINICAL DATA:  Metastatic lung cancer restaging, XRT complete * Tracking Code: BO * EXAM: CT CHEST, ABDOMEN, AND PELVIS WITH CONTRAST TECHNIQUE: Multidetector CT imaging of the chest, abdomen and pelvis was performed following the standard protocol during bolus administration of intravenous contrast. RADIATION DOSE REDUCTION: This exam was performed according to the departmental dose-optimization program which includes automated exposure control, adjustment of the mA and/or kV according to patient size and/or use of iterative reconstruction technique. CONTRAST:  174m OMNIPAQUE IOHEXOL 300 MG/ML SOLN, additional oral enteric contrast COMPARISON:  05/06/2021 FINDINGS: CT CHEST FINDINGS Cardiovascular: Aortic atherosclerosis. Normal heart size. Three-vessel coronary artery calcifications. No pericardial effusion. Mediastinum/Nodes: Interval decrease in size of previously noted enlarged right axillary lymph nodes, measuring 1.3 x 1.0 cm, previously 2.1 x 1.7 cm (series 2, image 23). Unchanged retrosternal/epicardial lymph node or soft tissue nodule measuring 1.2 x 1.1 cm (series 2, image 33). Unchanged subcentimeter pericardial lymph node or soft tissue nodule, measuring 0.7 cm (series 2, image 33). Resolution of previously prominent prevascular lymph nodes, now significantly subcentimeter (series 2, image 22). No other enlarged lymph nodes in the chest. Small hiatal hernia. Thyroid gland, trachea, and esophagus demonstrate no significant findings. Lungs/Pleura: Unchanged post treatment appearance of the chest, with significant volume loss of the left hemithorax and  areas of scarring and or rounded atelectasis of the anterior left upper lobe (series 4, image 51) and dependent left lung base (series 4, image 87). Unchanged, small, loculated left pleural effusion with some associated pleural thickening. Unchanged tiny pulmonary nodules of the right upper lobe, for example a 0.3 cm nodule of the peripheral right upper lobe (series 4, image 35) and a 0.3 cm nodule of the medial right upper lobe (series 4, image 34). Musculoskeletal: No chest wall mass or suspicious osseous lesions identified. CT ABDOMEN PELVIS FINDINGS Hepatobiliary: No solid liver abnormality is seen. No gallstones, gallbladder wall thickening, or biliary dilatation. Pancreas: Unremarkable. No pancreatic ductal dilatation or surrounding inflammatory changes. Spleen: Normal in size without significant abnormality. Adrenals/Urinary Tract: Adrenal glands are unremarkable. Status post left nephrectomy. The right kidney is normal, without renal calculi, solid lesion, or hydronephrosis. Bladder is unremarkable. Stomach/Bowel: Stomach is within normal limits. Appendix appears normal. No evidence of bowel wall thickening, distention, or inflammatory changes. Vascular/Lymphatic: Aortic atherosclerosis. Unchanged prominent subcentimeter left retroperitoneal lymph nodes, measuring up to 1.4 x 0.8 cm (series 2, image 74). Reproductive: No mass or other abnormality. Other: No abdominal wall hernia or abnormality. No ascites. Musculoskeletal: No acute osseous findings. IMPRESSION: 1. Interval decrease in size of previously noted enlarged right axillary lymph nodes, with stable or slightly improved mediastinal and epicardial nodes or soft tissue nodules. 2. Unchanged post  treatment appearance of the chest, with significant volume loss of the left hemithorax and areas of scarring and or rounded atelectasis of the anterior left upper lobe and dependent left lung base. Unchanged, small, loculated left pleural effusion with some  associated pleural thickening. 3. Unchanged tiny pulmonary nodules of the right upper lobe, again likely benign and incidental. Attention on follow-up. 4. Unchanged prominent left retroperitoneal lymph nodes. 5. Status post left nephrectomy. 6. Coronary artery disease. Aortic Atherosclerosis (ICD10-I70.0). Electronically Signed   By: Delanna Ahmadi M.D.   On: 07/28/2021 09:23   CT Abdomen Pelvis W Contrast  Result Date: 07/28/2021 CLINICAL DATA:  Metastatic lung cancer restaging, XRT complete * Tracking Code: BO * EXAM: CT CHEST, ABDOMEN, AND PELVIS WITH CONTRAST TECHNIQUE: Multidetector CT imaging of the chest, abdomen and pelvis was performed following the standard protocol during bolus administration of intravenous contrast. RADIATION DOSE REDUCTION: This exam was performed according to the departmental dose-optimization program which includes automated exposure control, adjustment of the mA and/or kV according to patient size and/or use of iterative reconstruction technique. CONTRAST:  150m OMNIPAQUE IOHEXOL 300 MG/ML SOLN, additional oral enteric contrast COMPARISON:  05/06/2021 FINDINGS: CT CHEST FINDINGS Cardiovascular: Aortic atherosclerosis. Normal heart size. Three-vessel coronary artery calcifications. No pericardial effusion. Mediastinum/Nodes: Interval decrease in size of previously noted enlarged right axillary lymph nodes, measuring 1.3 x 1.0 cm, previously 2.1 x 1.7 cm (series 2, image 23). Unchanged retrosternal/epicardial lymph node or soft tissue nodule measuring 1.2 x 1.1 cm (series 2, image 33). Unchanged subcentimeter pericardial lymph node or soft tissue nodule, measuring 0.7 cm (series 2, image 33). Resolution of previously prominent prevascular lymph nodes, now significantly subcentimeter (series 2, image 22). No other enlarged lymph nodes in the chest. Small hiatal hernia. Thyroid gland, trachea, and esophagus demonstrate no significant findings. Lungs/Pleura: Unchanged post treatment  appearance of the chest, with significant volume loss of the left hemithorax and areas of scarring and or rounded atelectasis of the anterior left upper lobe (series 4, image 51) and dependent left lung base (series 4, image 87). Unchanged, small, loculated left pleural effusion with some associated pleural thickening. Unchanged tiny pulmonary nodules of the right upper lobe, for example a 0.3 cm nodule of the peripheral right upper lobe (series 4, image 35) and a 0.3 cm nodule of the medial right upper lobe (series 4, image 34). Musculoskeletal: No chest wall mass or suspicious osseous lesions identified. CT ABDOMEN PELVIS FINDINGS Hepatobiliary: No solid liver abnormality is seen. No gallstones, gallbladder wall thickening, or biliary dilatation. Pancreas: Unremarkable. No pancreatic ductal dilatation or surrounding inflammatory changes. Spleen: Normal in size without significant abnormality. Adrenals/Urinary Tract: Adrenal glands are unremarkable. Status post left nephrectomy. The right kidney is normal, without renal calculi, solid lesion, or hydronephrosis. Bladder is unremarkable. Stomach/Bowel: Stomach is within normal limits. Appendix appears normal. No evidence of bowel wall thickening, distention, or inflammatory changes. Vascular/Lymphatic: Aortic atherosclerosis. Unchanged prominent subcentimeter left retroperitoneal lymph nodes, measuring up to 1.4 x 0.8 cm (series 2, image 74). Reproductive: No mass or other abnormality. Other: No abdominal wall hernia or abnormality. No ascites. Musculoskeletal: No acute osseous findings. IMPRESSION: 1. Interval decrease in size of previously noted enlarged right axillary lymph nodes, with stable or slightly improved mediastinal and epicardial nodes or soft tissue nodules. 2. Unchanged post treatment appearance of the chest, with significant volume loss of the left hemithorax and areas of scarring and or rounded atelectasis of the anterior left upper lobe and  dependent left lung base. Unchanged,  small, loculated left pleural effusion with some associated pleural thickening. 3. Unchanged tiny pulmonary nodules of the right upper lobe, again likely benign and incidental. Attention on follow-up. 4. Unchanged prominent left retroperitoneal lymph nodes. 5. Status post left nephrectomy. 6. Coronary artery disease. Aortic Atherosclerosis (ICD10-I70.0). Electronically Signed   By: Delanna Ahmadi M.D.   On: 07/28/2021 09:23     ASSESSMENT AND PLAN: This is a very pleasant 66 years old white female recently diagnosed with a stage IV (T3, N0, M1 a) non-small cell lung cancer, adenocarcinoma presented with large left lower lobe lung mass in addition to another lingular mass and malignant left pleural effusion diagnosed in September 2021.  The molecular studies showed positive PD-L1 expression of 95%.  The patient also has K-ras G12C mutation which will be an option for the second line treatment. With the high PD-L1 expression I discussed with the patient treatment with single agent Libtayo (Cempilimab) 350 mg IV every 3 weeks for up to 2 years unless the patient has evidence for disease progression or unacceptable toxicity. The patient started the first cycle of this treatment on January 15, 2020. She is status post 28 cycles.  For the enlarging right axillary lymph node, the patient underwent palliative radiotherapy to this area. The patient continues to tolerate her treatment with Libtayo (Cempilimab) fairly well with no concerning adverse effects. I recommended for her to proceed with cycle #29 today as planned. I will see her back for follow-up visit in 3 weeks for evaluation before the next cycle of her treatment. She was advised to call immediately if she has any other concerning symptoms in the interval. The patient voices understanding of current disease status and treatment options and is in agreement with the current care plan.  All questions were answered. The  patient knows to call the clinic with any problems, questions or concerns. We can certainly see the patient much sooner if necessary.   Disclaimer: This note was dictated with voice recognition software. Similar sounding words can inadvertently be transcribed and may not be corrected upon review.

## 2021-08-26 NOTE — Patient Instructions (Signed)
Cemiplimab Injection What is this medication? CEMIPLIMAB (se MIP li mab) treats skin cancer and lung cancer. It works by blocking a protein that causes cancer cells to grow and multiply. This helps to slow or stop the spread of cancer cells. It is a monoclonal antibody. This medicine may be used for other purposes; ask your health care provider or pharmacist if you have questions. COMMON BRAND NAME(S): LIBTAYO What should I tell my care team before I take this medication? They need to know if you have any of these conditions: Allogeneic stem cell transplant (uses someone else's stem cells) Autoimmune diseases, such as Crohn's disease, ulcerative colitis, or lupus Diabetes Nervous system problems, such as myasthenia gravis or Guillain-Barre syndrome Organ transplant Recent or ongoing radiation Thyroid disease An unusual or allergic reaction to cemiplimab, other medications, foods, dyes, or preservatives Pregnant or trying to get pregnant Breast-feeding How should I use this medication? This medication is injected into a vein. It is given by your care team in a hospital or clinic setting. A special MedGuide will be given to you before each treatment. Be sure to read this information carefully each time. Talk to your care team about the use of this medication in children. Special care may be needed. Overdosage: If you think you have taken too much of this medicine contact a poison control center or emergency room at once. NOTE: This medicine is only for you. Do not share this medicine with others. What if I miss a dose? Keep appointments for follow-up doses. It is important not to miss your dose. Call your care team if you are unable to keep an appointment. What may interact with this medication? Interactions have not been studied. This list may not describe all possible interactions. Give your health care provider a list of all the medicines, herbs, non-prescription drugs, or dietary  supplements you use. Also tell them if you smoke, drink alcohol, or use illegal drugs. Some items may interact with your medicine. What should I watch for while using this medication? This medication may make you feel generally unwell. This is not uncommon as chemotherapy can affect healthy cells as well as cancer cells. Report any side effects. Continue your course of treatment even though you feel ill until your care team tells you to stop. You may need blood work done while you are taking this medication. This medication may cause serious skin reactions. They can happen in the weeks to months after starting the medication. Contact your care team right away if you notice fevers or flu-like symptoms with a rash. The rash may be red or purple and then turn into blisters or peeling of the skin. You may also notice a red rash with swelling of the face, lips, or lymph nodes in your neck or under your arms. Tell your care team right away if you have any changes in your vision. This medication may increase blood sugar. The risk may be higher in patients who already have diabetes. Ask your care team what you can do to lower your risk of diabetes while taking this medication. Talk to your care team if you wish to become pregnant or think you might be pregnant. This medication can cause serious birth defects if taken during pregnancy. A negative pregnancy test is required before starting this medication. A reliable form of contraception is recommended while taking this medication and for at least 4 months after stopping it. Do not breast-feed while taking this medication and for at least 4  months after stopping it. What side effects may I notice from receiving this medication? Side effects that you should report to your care team as soon as possible: Allergic reactions--skin rash, itching, hives, swelling of the face, lips, tongue, or throat Change in vision Dry cough, shortness of breath or trouble  breathing Fever, neck pain or stiffness, sensitivity to light, headache, nausea, vomiting, confusion Headache, double vision, blurry vision, sensitivity to light, eye pain Heart muscle inflammation--unusual weakness or fatigue, shortness of breath, chest pain, fast or irregular heartbeat, dizziness, swelling of the ankles, feet, or hands High blood sugar (hyperglycemia)--increased thirst or amount of urine, unusual weakness or fatigue, blurry vision High thyroid levels (hyperthyroidism)--fast or irregular heartbeat, weight loss, excessive sweating or sensitivity to heat, tremors or shaking, anxiety, nervousness, irregular menstrual cycle or spotting Infusion reactions--chest pain, shortness of breath or trouble breathing, feeling faint or lightheaded Kidney injury (glomerulonephritis)--decrease in the amount of urine, red or dark brown urine, foamy or bubbly urine, swelling of the ankles, hands, or feet Liver injury--right upper belly pain, loss of appetite, nausea, light-colored stool, dark yellow or brown urine, yellowing skin or eyes, unusual weakness or fatigue Low adrenal gland function--nausea, vomiting, loss of appetite, unusual weakness or fatigue, dizziness Low red blood cell level--unusual weakness or fatigue, dizziness, headache, trouble breathing Low thyroid levels (hypothyroidism)--unusual weakness or fatigue, increased sensitivity to cold, constipation, hair loss, dry skin, weight gain, feelings of depression Pain, tingling, or numbness in the hands or feet, muscle weakness, trouble walking, loss of balance or coordination Rash, fever, and swollen lymph nodes Redness, blistering, peeling, or loosening of the skin, including inside the mouth Sudden or severe stomach pain, bloody diarrhea, fever, nausea, vomiting Unusual bruising or bleeding Side effects that usually do not require medical attention (report these to your care team if they continue or are bothersome): Bone  pain Diarrhea Muscle pain This list may not describe all possible side effects. Call your doctor for medical advice about side effects. You may report side effects to FDA at 1-800-FDA-1088. Where should I keep my medication? This medication is given in a hospital or clinic and will not be stored at home. NOTE: This sheet is a summary. It may not cover all possible information. If you have questions about this medicine, talk to your doctor, pharmacist, or health care provider.  2023 Elsevier/Gold Standard (2021-02-14 00:00:00)

## 2021-09-11 ENCOUNTER — Telehealth: Payer: Self-pay | Admitting: Internal Medicine

## 2021-09-11 NOTE — Progress Notes (Signed)
[facility] OFFICE PROGRESS NOTE  [name], NP [address] [place] [address]  DIAGNOSIS: Stage IV (T3, N0, M1 a) non-small cell lung cancer, adenocarcinoma presented with large left lower lobe lung mass in addition to another lingual mass and malignant left pleural effusion diagnosed in September 2021   Biomarker Findings Tumor Mutational Burden - 13 Muts/Mb Microsatellite status - MS-Stable Genomic Findings For a complete list of the genes assayed, please refer to the Appendix. KRAS G12C PRDM1 D243f*46 TERT promoter -146C>T TP53 V157F 7 Disease relevant genes with no reportable alterations: ALK, BRAF, EGFR, ERBB2, MET, RET, ROS1   PDL1 Expression: 95%  PRIOR THERAPY: Palliative radiotherapy to the enlarging right axillary lymphadenopathy under the care of Dr. MLisbeth Renshaw Completed on 07/11/21  CURRENT THERAPY: Libtayo (Cempilimab) 350 mg IV every 3 weeks.  First dose January 15, 2020.  Status post 29 cycles.  INTERVAL HISTORY: CPERL FOLMAR89y.o. female returns to the clinic today for a follow-up visit accompanied by her husband.  The patient is currently undergoing systemic treatment with immunotherapy with Libtayo.  She completed palliative radiation to the enlarging right axillary lymph node on 07/11/2021 under the care of Dr. MLisbeth Renshaw   Otherwise the patient is feeling well today without any concerning complaints.  She denies any fever, chills, night sweats, or unexplained weight loss.  She denies any shortness of breath, significant cough, hemoptysis, or chest pain.  Denies any nausea, vomiting, diarrhea, or constipation.  Denies any rashes or skin changes at this time.  Denies any headache or visual changes. She reports the right axillary lymph node that was previously treated with radiation is stable.  She is here today for evaluation and repeat blood work before undergoing cycle #30.    MEDICAL HISTORY: Past Medical History:  Diagnosis Date    Arthritis    Bilateral   Hyperlipidemia    Hypertension    Pleural effusion    Pneumonia    Stage IV adenocarcinoma of lung, left (HCC)     ALLERGIES:  is allergic to ace inhibitors.  MEDICATIONS:  Current Outpatient Medications  Medication Sig Dispense Refill   cholecalciferol (VITAMIN D3) 25 MCG (1000 UNIT) tablet Take 2,000 Units by mouth daily.     levothyroxine (SYNTHROID) 50 MCG tablet TAKE 1 TABLET BY MOUTH EVERY DAY BEFORE BREAKFAST 90 tablet 0   metFORMIN (GLUCOPHAGE) 500 MG tablet Take 500 mg by mouth 2 (two) times daily with a meal.     pravastatin (PRAVACHOL) 40 MG tablet Take 40 mg by mouth daily.     sertraline (ZOLOFT) 50 MG tablet Take 75 mg by mouth daily.     No current facility-administered medications for this visit.    SURGICAL HISTORY:  Past Surgical History:  Procedure Laterality Date   NEPHRECTOMY Left     REVIEW OF SYSTEMS:   Review of Systems  Constitutional: Negative for appetite change, chills, fatigue, fever and unexpected weight change.  HENT:  Negative for mouth sores, nosebleeds, sore throat and trouble swallowing.   Eyes: Negative for eye problems and icterus.  Respiratory:  Negative for hemoptysis, cough, shortness of breath, and wheezing.   Cardiovascular: Negative for chest pain. Positive for bilateral lower extremity swelling.  Gastrointestinal: Negative for abdominal pain, constipation, diarrhea, nausea and vomiting.  Genitourinary: Negative for bladder incontinence, difficulty urinating, dysuria, frequency and hematuria.   Musculoskeletal: Negative for back pain, gait problem, neck pain and neck stiffness.  Skin: Negative for itching and  rash.  Neurological: Negative for dizziness, extremity weakness, gait problem, headaches, light-headedness and seizures.  Hematological: Negative for adenopathy. Does not bruise/bleed easily.  Psychiatric/Behavioral: Negative for confusion, depression and sleep disturbance. The patient is not  nervous/anxious.         PHYSICAL EXAMINATION:  There were no vitals taken for this visit.  ECOG PERFORMANCE STATUS: 1 - Symptomatic but completely ambulatory  Physical Exam  Constitutional: Oriented to person, place, and time and well-developed, well-nourished, and in no distress.  HENT:  Head: Normocephalic and atraumatic.  Mouth/Throat: Oropharynx is clear and moist. No oropharyngeal exudate. Positive for nasal congestion.  Eyes: Conjunctivae are normal. Right eye exhibits no discharge. Left eye exhibits no discharge. No scleral icterus.  Neck: Normal range of motion. Neck supple.  Cardiovascular: Bradycardic, regular rhythm, normal heart sounds and intact distal pulses.   Pulmonary/Chest: Effort normal and breath sounds normal. No respiratory distress. No wheezes. No rales.  Abdominal: Soft. Bowel sounds are normal. Exhibits no distension and no mass. There is no tenderness.  Musculoskeletal: Normal range of motion. Bilateral lower extremity swelling.  Lymphadenopathy:    No cervical adenopathy.  Neurological: Alert and oriented to person, place, and time. Exhibits normal muscle tone. Gait normal. Coordination normal.  Skin: Skin is warm and dry. No rash noted. Not diaphoretic. No erythema. No pallor.  Psychiatric: Mood, memory and judgment normal.  Vitals reviewed.  LABORATORY DATA: Lab Results  Component Value Date   WBC 6.7 08/26/2021   HGB 11.4 (L) 08/26/2021   HCT 36.3 08/26/2021   MCV 87.9 08/26/2021   PLT 270 08/26/2021      Chemistry      Component Value Date/Time   NA 139 08/26/2021 0846   K 3.7 08/26/2021 0846   CL 109 08/26/2021 0846   CO2 23 08/26/2021 0846   BUN 19 08/26/2021 0846   CREATININE 0.96 08/26/2021 0846      Component Value Date/Time   CALCIUM 9.5 08/26/2021 0846   ALKPHOS 73 08/26/2021 0846   AST 16 08/26/2021 0846   ALT 20 08/26/2021 0846   BILITOT 0.4 08/26/2021 0846       RADIOGRAPHIC STUDIES:  No results  found.   ASSESSMENT/PLAN:  This is a very pleasant 66 year old Caucasian female diagnosed with stage IV (T3, N0, M1A) non-small cell lung cancer, adenocarcinoma.  She presented with a large left lower lobe lung mass in addition to another lingular mass and a malignant left pleural effusion.  She was diagnosed in September 2021.  Her molecular studies show that she has a PD-L1 expression at 95%.  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   The patient is currently undergoing treatment with single agent Libtayo (Cempilimab) 350 mg IV every 3 weeks for up to 2 years unless the patient has evidence for disease progression or unacceptable toxicity.  She is status post 29 cycles and tolerated it well without any concerning adverse side effects.    The patient underwent radiation to the enlarging right axillary lymph node under the care of Dr. Lisbeth Renshaw in the next few days. Her last day of radiation was on 07/11/21.   Labs were reviewed. Her CMP is pending. Recommend that she proceed with cycle #30 today scheduled as long as her CMP is within parameters.  We will see her back for follow-up visit in 3 weeks for evaluation and repeat blood work before starting cycle #31.  I will arrange for  restaging CT scan the chest, abdomen, pelvis prior to starting her next cycle of treatment.  The patient was advised to call immediately if she has any concerning symptoms in the interval. The patient voices understanding of current disease status and treatment options and is in agreement with the current care plan. All questions were answered. The patient knows to call the clinic with any problems, questions or concerns. We can certainly see the patient much sooner if necessary          No orders of the defined types were placed in this encounter.    The total time spent in the appointment was 20-29 minutes.   Keviana Guida L Michele Judy, PA-C 09/11/21

## 2021-09-11 NOTE — Telephone Encounter (Signed)
Called patient regarding upcoming July and August appointments, patient has been called and notified.

## 2021-09-16 ENCOUNTER — Inpatient Hospital Stay: Payer: Medicare Other

## 2021-09-16 ENCOUNTER — Inpatient Hospital Stay: Payer: Medicare Other | Attending: Internal Medicine | Admitting: Physician Assistant

## 2021-09-16 ENCOUNTER — Other Ambulatory Visit: Payer: Self-pay

## 2021-09-16 VITALS — BP 133/60 | HR 92 | Resp 18

## 2021-09-16 VITALS — BP 122/67 | HR 54 | Temp 97.7°F | Resp 16 | Ht 64.0 in | Wt 139.2 lb

## 2021-09-16 DIAGNOSIS — J910 Malignant pleural effusion: Secondary | ICD-10-CM | POA: Diagnosis not present

## 2021-09-16 DIAGNOSIS — C3492 Malignant neoplasm of unspecified part of left bronchus or lung: Secondary | ICD-10-CM

## 2021-09-16 DIAGNOSIS — Z5112 Encounter for antineoplastic immunotherapy: Secondary | ICD-10-CM | POA: Diagnosis present

## 2021-09-16 DIAGNOSIS — E039 Hypothyroidism, unspecified: Secondary | ICD-10-CM | POA: Diagnosis not present

## 2021-09-16 DIAGNOSIS — C3432 Malignant neoplasm of lower lobe, left bronchus or lung: Secondary | ICD-10-CM | POA: Insufficient documentation

## 2021-09-16 DIAGNOSIS — Z923 Personal history of irradiation: Secondary | ICD-10-CM | POA: Diagnosis not present

## 2021-09-16 DIAGNOSIS — Z79899 Other long term (current) drug therapy: Secondary | ICD-10-CM | POA: Insufficient documentation

## 2021-09-16 DIAGNOSIS — R5383 Other fatigue: Secondary | ICD-10-CM

## 2021-09-16 LAB — CMP (CANCER CENTER ONLY)
ALT: 11 U/L (ref 0–44)
AST: 17 U/L (ref 15–41)
Albumin: 3.9 g/dL (ref 3.5–5.0)
Alkaline Phosphatase: 70 U/L (ref 38–126)
Anion gap: 7 (ref 5–15)
BUN: 20 mg/dL (ref 8–23)
CO2: 22 mmol/L (ref 22–32)
Calcium: 9.3 mg/dL (ref 8.9–10.3)
Chloride: 108 mmol/L (ref 98–111)
Creatinine: 0.87 mg/dL (ref 0.44–1.00)
GFR, Estimated: >60 mL/min (ref >60)
Glucose, Bld: 137 mg/dL — ABNORMAL HIGH (ref 70–99)
Potassium: 4.1 mmol/L (ref 3.5–5.1)
Sodium: 137 mmol/L (ref 135–145)
Total Bilirubin: 0.3 mg/dL (ref 0.3–1.2)
Total Protein: 7.1 g/dL (ref 6.5–8.1)

## 2021-09-16 LAB — CBC WITH DIFFERENTIAL (CANCER CENTER ONLY)
Abs Immature Granulocytes: 0.02 10*3/uL (ref 0.00–0.07)
Basophils Absolute: 0.1 10*3/uL (ref 0.0–0.1)
Basophils Relative: 1 %
Eosinophils Absolute: 0.3 10*3/uL (ref 0.0–0.5)
Eosinophils Relative: 6 %
HCT: 36.1 % (ref 36.0–46.0)
Hemoglobin: 11.1 g/dL — ABNORMAL LOW (ref 12.0–15.0)
Immature Granulocytes: 0 %
Lymphocytes Relative: 14 %
Lymphs Abs: 0.7 10*3/uL (ref 0.7–4.0)
MCH: 26.9 pg (ref 26.0–34.0)
MCHC: 30.7 g/dL (ref 30.0–36.0)
MCV: 87.6 fL (ref 80.0–100.0)
Monocytes Absolute: 0.3 10*3/uL (ref 0.1–1.0)
Monocytes Relative: 6 %
Neutro Abs: 4 10*3/uL (ref 1.7–7.7)
Neutrophils Relative %: 73 %
Platelet Count: 320 10*3/uL (ref 150–400)
RBC: 4.12 MIL/uL (ref 3.87–5.11)
RDW: 15.7 % — ABNORMAL HIGH (ref 11.5–15.5)
WBC Count: 5.4 10*3/uL (ref 4.0–10.5)
nRBC: 0 % (ref 0.0–0.2)

## 2021-09-16 LAB — TSH: TSH: 4.896 u[IU]/mL — ABNORMAL HIGH (ref 0.350–4.500)

## 2021-09-16 MED ORDER — SODIUM CHLORIDE 0.9 % IV SOLN
350.0000 mg | Freq: Once | INTRAVENOUS | Status: AC
  Administered 2021-09-16: 350 mg via INTRAVENOUS
  Filled 2021-09-16: qty 7

## 2021-09-16 MED ORDER — SODIUM CHLORIDE 0.9 % IV SOLN: Freq: Once | INTRAVENOUS | Status: AC

## 2021-09-16 MED ORDER — LEVOTHYROXINE SODIUM 50 MCG PO TABS: ORAL_TABLET | ORAL | 1 refills | Status: DC

## 2021-09-16 NOTE — Patient Instructions (Signed)
[facility]  Discharge Instructions: Thank you for choosing [facility] to provide your oncology and hematology care.   If you have a lab appointment with the [facility], please go directly to the [facility] and check in at the registration area.   Wear comfortable clothing and clothing appropriate for easy access to any Portacath or PICC line.   We strive to give you quality time with your provider. You may need to reschedule your appointment if you arrive late (15 or more minutes).  Arriving late affects you and other patients whose appointments are after yours.  Also, if you miss three or more appointments without notifying the office, you may be dismissed from the clinic at the provider's discretion.      For prescription refill requests, have your pharmacy contact our office and allow 72 hours for refills to be completed.    Today you received the following chemotherapy and/or immunotherapy agents: Cemiplimab.       To help prevent nausea and vomiting after your treatment, we encourage you to take your nausea medication as directed.  BELOW ARE SYMPTOMS THAT SHOULD BE REPORTED IMMEDIATELY: *FEVER GREATER THAN 100.4 F (38 C) OR HIGHER *CHILLS OR SWEATING *NAUSEA AND VOMITING THAT IS NOT CONTROLLED WITH YOUR NAUSEA MEDICATION *UNUSUAL SHORTNESS OF BREATH *UNUSUAL BRUISING OR BLEEDING *URINARY PROBLEMS (pain or burning when urinating, or frequent urination) *BOWEL PROBLEMS (unusual diarrhea, constipation, pain near the anus) TENDERNESS IN MOUTH AND THROAT WITH OR WITHOUT PRESENCE OF ULCERS (sore throat, sores in mouth, or a toothache) UNUSUAL RASH, SWELLING OR PAIN  UNUSUAL VAGINAL DISCHARGE OR ITCHING   Items with * indicate a potential emergency and should be followed up as soon as possible or go to the Emergency Department if any problems should occur.  Please show the CHEMOTHERAPY ALERT CARD or IMMUNOTHERAPY ALERT CARD at check-in  to the Emergency Department and triage nurse.  Should you have questions after your visit or need to cancel or reschedule your appointment, please contact [facility]  Dept: [phone]  and follow the prompts.  Office hours are 8:00 a.m. to 4:30 p.m. Monday - Friday. Please note that voicemails left after 4:00 p.m. may not be returned until the following business day.  We are closed weekends and major holidays. You have access to a nurse at all times for urgent questions. Please call the main number to the clinic Dept: [phone] and follow the prompts.   For any non-urgent questions, you may also contact your provider using MyChart. We now offer e-Visits for anyone 67 and older to request care online for non-urgent symptoms. For details visit [email or web address].   Also download the MyChart app! Go to the app store, search "MyChart", open the app, select [facility], and log in with your MyChart username and password.  Masks are optional in the cancer centers. If you would like for your care team to wear a mask while they are taking care of you, please let them know. For doctor visits, patients may have with them one support person who is at least 67 years old. At this time, visitors are not allowed in the infusion area.

## 2021-09-29 ENCOUNTER — Other Ambulatory Visit: Payer: Self-pay

## 2021-10-01 ENCOUNTER — Other Ambulatory Visit: Payer: Self-pay

## 2021-10-03 ENCOUNTER — Ambulatory Visit (HOSPITAL_COMMUNITY)
Admission: RE | Admit: 2021-10-03 | Discharge: 2021-10-03 | Disposition: A | Discharge status: Home / Self Care | Payer: Medicare Other | Source: Ambulatory Visit | Attending: Physician Assistant | Admitting: Physician Assistant

## 2021-10-03 DIAGNOSIS — C3492 Malignant neoplasm of unspecified part of left bronchus or lung: Secondary | ICD-10-CM | POA: Insufficient documentation

## 2021-10-03 MED ORDER — IOHEXOL 300 MG/ML  SOLN
75.0000 mL | Freq: Once | INTRAMUSCULAR | Status: AC | PRN
  Administered 2021-10-03: 75 mL via INTRAVENOUS

## 2021-10-03 MED ORDER — SODIUM CHLORIDE (PF) 0.9 % IJ SOLN
INTRAMUSCULAR | Status: AC
  Filled 2021-10-03: qty 50

## 2021-10-07 ENCOUNTER — Inpatient Hospital Stay: Payer: Medicare Other

## 2021-10-07 ENCOUNTER — Inpatient Hospital Stay: Payer: Medicare Other | Attending: Internal Medicine

## 2021-10-07 ENCOUNTER — Inpatient Hospital Stay (HOSPITAL_BASED_OUTPATIENT_CLINIC_OR_DEPARTMENT_OTHER): Payer: Medicare Other | Admitting: Internal Medicine

## 2021-10-07 ENCOUNTER — Other Ambulatory Visit: Payer: Self-pay

## 2021-10-07 ENCOUNTER — Encounter: Payer: Self-pay | Admitting: Internal Medicine

## 2021-10-07 VITALS — BP 122/67 | HR 64 | Temp 97.7°F | Resp 14 | Wt 134.5 lb

## 2021-10-07 VITALS — BP 114/52 | HR 62 | Temp 97.8°F | Resp 16

## 2021-10-07 DIAGNOSIS — C3492 Malignant neoplasm of unspecified part of left bronchus or lung: Secondary | ICD-10-CM | POA: Diagnosis not present

## 2021-10-07 DIAGNOSIS — Z905 Acquired absence of kidney: Secondary | ICD-10-CM | POA: Insufficient documentation

## 2021-10-07 DIAGNOSIS — Z5112 Encounter for antineoplastic immunotherapy: Secondary | ICD-10-CM

## 2021-10-07 DIAGNOSIS — C3432 Malignant neoplasm of lower lobe, left bronchus or lung: Secondary | ICD-10-CM | POA: Insufficient documentation

## 2021-10-07 DIAGNOSIS — Z79899 Other long term (current) drug therapy: Secondary | ICD-10-CM | POA: Diagnosis not present

## 2021-10-07 DIAGNOSIS — R0602 Shortness of breath: Secondary | ICD-10-CM | POA: Insufficient documentation

## 2021-10-07 DIAGNOSIS — R5383 Other fatigue: Secondary | ICD-10-CM

## 2021-10-07 LAB — CBC WITH DIFFERENTIAL (CANCER CENTER ONLY)
Abs Immature Granulocytes: 0.01 10*3/uL (ref 0.00–0.07)
Basophils Absolute: 0.1 10*3/uL (ref 0.0–0.1)
Basophils Relative: 1 %
Eosinophils Absolute: 0.3 10*3/uL (ref 0.0–0.5)
Eosinophils Relative: 5 %
HCT: 32.9 % — ABNORMAL LOW (ref 36.0–46.0)
Hemoglobin: 10.3 g/dL — ABNORMAL LOW (ref 12.0–15.0)
Immature Granulocytes: 0 %
Lymphocytes Relative: 14 %
Lymphs Abs: 0.7 10*3/uL (ref 0.7–4.0)
MCH: 27 pg (ref 26.0–34.0)
MCHC: 31.3 g/dL (ref 30.0–36.0)
MCV: 86.4 fL (ref 80.0–100.0)
Monocytes Absolute: 0.3 10*3/uL (ref 0.1–1.0)
Monocytes Relative: 6 %
Neutro Abs: 3.8 10*3/uL (ref 1.7–7.7)
Neutrophils Relative %: 74 %
Platelet Count: 260 10*3/uL (ref 150–400)
RBC: 3.81 MIL/uL — ABNORMAL LOW (ref 3.87–5.11)
RDW: 15.9 % — ABNORMAL HIGH (ref 11.5–15.5)
WBC Count: 5.1 10*3/uL (ref 4.0–10.5)
nRBC: 0 % (ref 0.0–0.2)

## 2021-10-07 LAB — CMP (CANCER CENTER ONLY)
ALT: 14 U/L (ref 0–44)
AST: 17 U/L (ref 15–41)
Albumin: 3.6 g/dL (ref 3.5–5.0)
Alkaline Phosphatase: 64 U/L (ref 38–126)
Anion gap: 7 (ref 5–15)
BUN: 23 mg/dL (ref 8–23)
CO2: 23 mmol/L (ref 22–32)
Calcium: 8.8 mg/dL — ABNORMAL LOW (ref 8.9–10.3)
Chloride: 109 mmol/L (ref 98–111)
Creatinine: 0.81 mg/dL (ref 0.44–1.00)
GFR, Estimated: >60 mL/min (ref >60)
Glucose, Bld: 204 mg/dL — ABNORMAL HIGH (ref 70–99)
Potassium: 3.7 mmol/L (ref 3.5–5.1)
Sodium: 139 mmol/L (ref 135–145)
Total Bilirubin: 0.3 mg/dL (ref 0.3–1.2)
Total Protein: 6.8 g/dL (ref 6.5–8.1)

## 2021-10-07 LAB — TSH: TSH: 5.518 u[IU]/mL — ABNORMAL HIGH (ref 0.350–4.500)

## 2021-10-07 MED ORDER — SODIUM CHLORIDE 0.9 % IV SOLN
350.0000 mg | Freq: Once | INTRAVENOUS | Status: AC
  Administered 2021-10-07: 350 mg via INTRAVENOUS
  Filled 2021-10-07: qty 7

## 2021-10-07 MED ORDER — SODIUM CHLORIDE 0.9 % IV SOLN: Freq: Once | INTRAVENOUS | Status: AC

## 2021-10-07 NOTE — Progress Notes (Signed)
[facility] Telephone:([phone]   Fax:([phone]  OFFICE PROGRESS NOTE  Gabrielle Dandy, NP [place] [address]  DIAGNOSIS: stage IV (T3, N0, M1 a) non-small cell lung cancer, adenocarcinoma presented with large left lower lobe lung mass in addition to another lingual mass and malignant left pleural effusion diagnosed in September 2021  Biomarker Findings Tumor Mutational Burden - 13 Muts/Mb Microsatellite status - MS-Stable Genomic Findings For a complete list of the genes assayed, please refer to the Appendix. KRAS G12C PRDM1 D235f*46 TERT promoter -146C>T TP53 V157F 7 Disease relevant genes with no reportable alterations: ALK, BRAF, EGFR, ERBB2, MET, RET, ROS1   PDL1 Expression: 95%  PRIOR THERAPY: Palliative radiotherapy to the enlarging right axillary lymphadenopathy under the care of Dr. MLisbeth Renshaw  CURRENT THERAPY: Libtayo (Cempilimab) 350 mg IV every 3 weeks.  First dose January 15, 2020.  Status post 30 cycles.  INTERVAL HISTORY: [name] 66y.o. female returns to the clinic today for follow-up visit.  The patient is feeling fine today with no concerning complaints except for occasional shortness of breath with exertion.  She denied having any chest pain, cough or hemoptysis.  She has no nausea, vomiting, diarrhea or constipation.  She has no headache or visual changes.  She denied having any fever or chills.  She has no recent weight loss or night sweats.  She continues to tolerate her treatment with Libtayo (Cempilimab) fairly well.  She is here for evaluation with repeat CT scan of the chest, abdomen and pelvis for restaging of her disease.   MEDICAL HISTORY: Past Medical History:  Diagnosis Date   Arthritis    Bilateral   Hyperlipidemia    Hypertension    Pleural effusion    Pneumonia    Stage IV adenocarcinoma of lung, left (HCC)     ALLERGIES:  is allergic to ace inhibitors.  MEDICATIONS:  Current  Outpatient Medications  Medication Sig Dispense Refill   cholecalciferol (VITAMIN D3) 25 MCG (1000 UNIT) tablet Take 2,000 Units by mouth daily.     levothyroxine (SYNTHROID) 50 MCG tablet TAKE 1 TABLET BY MOUTH EVERY DAY BEFORE BREAKFAST 90 tablet 1   metFORMIN (GLUCOPHAGE) 500 MG tablet Take 500 mg by mouth 2 (two) times daily with a meal.     pravastatin (PRAVACHOL) 40 MG tablet Take 40 mg by mouth daily.     sertraline (ZOLOFT) 50 MG tablet Take 75 mg by mouth daily.     No current facility-administered medications for this visit.    SURGICAL HISTORY:  Past Surgical History:  Procedure Laterality Date   NEPHRECTOMY Left     REVIEW OF SYSTEMS:  Constitutional: negative Eyes: negative Ears, nose, mouth, throat, and face: negative Respiratory: positive for dyspnea on exertion Cardiovascular: negative Gastrointestinal: negative Genitourinary:negative Integument/breast: negative Hematologic/lymphatic: negative Musculoskeletal:negative Neurological: negative Behavioral/Psych: negative Endocrine: negative Allergic/Immunologic: negative   PHYSICAL EXAMINATION: General appearance: alert, cooperative, and no distress Head: Normocephalic, without obvious abnormality, atraumatic Neck: no adenopathy, no JVD, supple, symmetrical, trachea midline, and thyroid not enlarged, symmetric, no tenderness/mass/nodules Lymph nodes: Cervical, supraclavicular, and axillary nodes normal. Resp: clear to auscultation bilaterally Back: symmetric, no curvature. ROM normal. No CVA tenderness. Cardio: regular rate and rhythm, S1, S2 normal, no murmur, click, rub or gallop GI: soft, non-tender; bowel sounds normal; no masses,  no organomegaly Extremities: extremities normal, atraumatic, no cyanosis or edema Neurologic: Alert and oriented X 3, normal strength and tone. Normal  symmetric reflexes. Normal coordination and gait  ECOG PERFORMANCE STATUS: 1 - Symptomatic but completely ambulatory  Blood  pressure 122/67, pulse 64, temperature 97.7 F (36.5 C), temperature source Oral, resp. rate 14, weight 134 lb 8 oz (61 kg), SpO2 100 %.  LABORATORY DATA: Lab Results  Component Value Date   WBC 5.1 10/07/2021   HGB 10.3 (L) 10/07/2021   HCT 32.9 (L) 10/07/2021   MCV 86.4 10/07/2021   PLT 260 10/07/2021      Chemistry      Component Value Date/Time   NA 137 09/16/2021 0906   K 4.1 09/16/2021 0906   CL 108 09/16/2021 0906   CO2 22 09/16/2021 0906   BUN 20 09/16/2021 0906   CREATININE 0.87 09/16/2021 0906      Component Value Date/Time   CALCIUM 9.3 09/16/2021 0906   ALKPHOS 70 09/16/2021 0906   AST 17 09/16/2021 0906   ALT 11 09/16/2021 0906   BILITOT 0.3 09/16/2021 0906       RADIOGRAPHIC STUDIES: CT CHEST ABDOMEN PELVIS W CONTRAST  Result Date: 10/03/2021 CLINICAL DATA:  A 66 year old female presents for follow-up of metastatic non-small cell lung cancer. * Tracking Code: BO * EXAM: CT CHEST, ABDOMEN, AND PELVIS WITH CONTRAST TECHNIQUE: Multidetector CT imaging of the chest, abdomen and pelvis was performed following the standard protocol during bolus administration of intravenous contrast. RADIATION DOSE REDUCTION: This exam was performed according to the departmental dose-optimization program which includes automated exposure control, adjustment of the mA and/or kV according to patient size and/or use of iterative reconstruction technique. CONTRAST:  66mL OMNIPAQUE IOHEXOL 300 MG/ML  SOLN COMPARISON:  Jul 28, 2021. FINDINGS: CT CHEST FINDINGS Cardiovascular: Calcified and noncalcified atherosclerotic plaque in the thoracic aorta is moderate without change. Normal heart size. Normal caliber central pulmonary vessels. Shift of mediastinal structures into the LEFT chest following post treatment changes and related volume loss. Mediastinum/Nodes: Pre pericardial lymph node (image 33/2) 12 mm previously 11 mm not substantially changed since February of 2023. No signs of adenopathy  elsewhere in the chest. RIGHT axillary lymph nodes seen on previous imaging measure at most 7 mm for the dominant lymph node (image 21/2) previously 10 mm in May of 2023 and frank enlargement noted in February. Small hiatal hernia. Lungs/Pleura: Pleural nodularity/thickening has an appearance more suggestive of rounded atelectasis along the LEFT chest wall pleural interface with swirling of vessels passing into the area of airspace disease. This is in the LEFT upper lobe. In the LEFT lower lobe there is likewise a nodular appearing area with bronchovascular distortion and chronic appearing pleural thickening and small chronic pleural effusion likewise without substantial change from previous imaging. The airways are patent in the LEFT chest Scattered areas of ground-glass in the periphery of the RIGHT lower lobe have developed since previous imaging, for instance on image 83 of series 4 there is a 9 x 7 mm area of ground-glass nodularity. Other areas of bronchovascular nodularity are present in this region. Musculoskeletal: See below for full musculoskeletal details. CT ABDOMEN PELVIS FINDINGS Hepatobiliary: Hepatic contours are smooth. No visible lesion. No pericholecystic stranding. Portal vein is patent. No biliary duct dilation. Pancreas: Mild to moderate atrophy of the pancreas without signs of inflammation or visible lesion. Spleen: Normal. Adrenals/Urinary Tract: Adrenal glands are normal. Post LEFT nephrectomy. RIGHT kidney with normal enhancement and without suspicious renal lesion. Urinary bladder decompressed limiting assessment. Stomach/Bowel: No stranding adjacent to the stomach. No sign of small bowel obstruction or acute small bowel  process. Stool in much of the colon. No signs of colonic inflammation. Appendix is normal. Cecum is midline likely representing mobile cecum but without signs of obstruction Vascular/Lymphatic: Aortic atherosclerosis. No sign of aneurysm. Smooth contour of the IVC. There  is no gastrohepatic or hepatoduodenal ligament lymphadenopathy. No retroperitoneal or mesenteric lymphadenopathy. No pelvic sidewall lymphadenopathy. Reproductive: Unremarkable by CT. Other: No ascites.  No pneumoperitoneum.  No mesenteric stranding. Musculoskeletal: Spinal degenerative changes. No acute or destructive bone process. IMPRESSION: 1. Stable post treatment changes in the LEFT chest with chronic effusion and suspected areas of rounded atelectasis. 2. Unenlarged RIGHT axillary lymph nodes remain mildly prominent but continue to diminish in size. 3. Stable pre pericardial lymph node in the lower chest anterior to the RIGHT heart. Attention on follow-up. 4. New areas of peribronchovascular ground-glass and tree in bud favored to represent post inflammatory or post infectious changes. Correlate with respiratory symptoms and suggest attention on follow-up. 5. Post LEFT nephrectomy. No signs of metastatic disease to the abdomen or pelvis. Aortic Atherosclerosis (ICD10-I70.0). Electronically Signed   By: Zetta Bills M.D.   On: 10/03/2021 16:54     ASSESSMENT AND PLAN: This is a very pleasant 66 years old white female recently diagnosed with a stage IV (T3, N0, M1 a) non-small cell lung cancer, adenocarcinoma presented with large left lower lobe lung mass in addition to another lingular mass and malignant left pleural effusion diagnosed in September 2021.  The molecular studies showed positive PD-L1 expression of 95%.  The patient also has K-ras G12C mutation which will be an option for the second line treatment. With the high PD-L1 expression I discussed with the patient treatment with single agent Libtayo (Cempilimab) 350 mg IV every 3 weeks for up to 2 years unless the patient has evidence for disease progression or unacceptable toxicity. The patient started the first cycle of this treatment on January 15, 2020. She is status post 30 cycles.  For the enlarging right axillary lymph node, the patient  underwent palliative radiotherapy to this area. The patient continues to tolerate her treatment with Libtayo (Cempilimab) fairly well with no concerning adverse effects. She had repeat CT scan of the chest, abdomen and pelvis performed recently.  I personally and independently reviewed the scan images and discussed the result with the patient and her husband. Her scan showed no concerning findings for disease recurrence or metastasis. I recommended for the patient to continue her current treatment with Libtayo (Cempilimab) and she will proceed with cycle #31 today. I will see her back for follow-up visit in 3 weeks for evaluation before the next cycle of her treatment. For the suspicious groundglass and tree-in-bud abnormality in the right lung, we will continue to monitor it closely. The patient was advised to call immediately if she has any other concerning symptoms in the interval. The patient voices understanding of current disease status and treatment options and is in agreement with the current care plan. The total time spent in the appointment was 30 minutes.  All questions were answered. The patient knows to call the clinic with any problems, questions or concerns. We can certainly see the patient much sooner if necessary.   Disclaimer: This note was dictated with voice recognition software. Similar sounding words can inadvertently be transcribed and may not be corrected upon review.

## 2021-10-07 NOTE — Patient Instructions (Signed)
[facility]  Discharge Instructions: Thank you for choosing [facility] to provide your oncology and hematology care.   If you have a lab appointment with the [facility], please go directly to the [facility] and check in at the registration area.   Wear comfortable clothing and clothing appropriate for easy access to any Portacath or PICC line.   We strive to give you quality time with your provider. You may need to reschedule your appointment if you arrive late (15 or more minutes).  Arriving late affects you and other patients whose appointments are after yours.  Also, if you miss three or more appointments without notifying the office, you may be dismissed from the clinic at the provider's discretion.      For prescription refill requests, have your pharmacy contact our office and allow 72 hours for refills to be completed.    Today you received the following chemotherapy and/or immunotherapy agents: Cemiplimab.       To help prevent nausea and vomiting after your treatment, we encourage you to take your nausea medication as directed.  BELOW ARE SYMPTOMS THAT SHOULD BE REPORTED IMMEDIATELY: *FEVER GREATER THAN 100.4 F (38 C) OR HIGHER *CHILLS OR SWEATING *NAUSEA AND VOMITING THAT IS NOT CONTROLLED WITH YOUR NAUSEA MEDICATION *UNUSUAL SHORTNESS OF BREATH *UNUSUAL BRUISING OR BLEEDING *URINARY PROBLEMS (pain or burning when urinating, or frequent urination) *BOWEL PROBLEMS (unusual diarrhea, constipation, pain near the anus) TENDERNESS IN MOUTH AND THROAT WITH OR WITHOUT PRESENCE OF ULCERS (sore throat, sores in mouth, or a toothache) UNUSUAL RASH, SWELLING OR PAIN  UNUSUAL VAGINAL DISCHARGE OR ITCHING   Items with * indicate a potential emergency and should be followed up as soon as possible or go to the Emergency Department if any problems should occur.  Please show the CHEMOTHERAPY ALERT CARD or IMMUNOTHERAPY ALERT CARD at check-in  to the Emergency Department and triage nurse.  Should you have questions after your visit or need to cancel or reschedule your appointment, please contact [facility]  Dept: [phone]  and follow the prompts.  Office hours are 8:00 a.m. to 4:30 p.m. Monday - Friday. Please note that voicemails left after 4:00 p.m. may not be returned until the following business day.  We are closed weekends and major holidays. You have access to a nurse at all times for urgent questions. Please call the main number to the clinic Dept: [phone] and follow the prompts.   For any non-urgent questions, you may also contact your provider using MyChart. We now offer e-Visits for anyone 79 and older to request care online for non-urgent symptoms. For details visit [email or web address].   Also download the MyChart app! Go to the app store, search "MyChart", open the app, select [facility], and log in with your MyChart username and password.  Masks are optional in the cancer centers. If you would like for your care team to wear a mask while they are taking care of you, please let them know. For doctor visits, patients may have with them one support person who is at least 67 years old. At this time, visitors are not allowed in the infusion area. Cemiplimab Injection What is this medication? CEMIPLIMAB (se MIP li mab) treats skin cancer and lung cancer. It works by blocking a protein that causes cancer cells to grow and multiply. This helps to slow or stop the spread of cancer cells. It is a monoclonal antibody. This medicine may  be used for other purposes; ask your health care provider or pharmacist if you have questions. COMMON BRAND NAME(S): LIBTAYO What should I tell my care team before I take this medication? They need to know if you have any of these conditions: Allogeneic stem cell transplant (uses someone else's stem cells) Autoimmune diseases, such as Crohn's disease,  ulcerative colitis, or lupus Diabetes Nervous system problems, such as myasthenia gravis or Guillain-Barre syndrome Organ transplant Recent or ongoing radiation Thyroid disease An unusual or allergic reaction to cemiplimab, other medications, foods, dyes, or preservatives Pregnant or trying to get pregnant Breast-feeding How should I use this medication? This medication is injected into a vein. It is given by your care team in a hospital or clinic setting. A special MedGuide will be given to you before each treatment. Be sure to read this information carefully each time. Talk to your care team about the use of this medication in children. Special care may be needed. Overdosage: If you think you have taken too much of this medicine contact a poison control center or emergency room at once. NOTE: This medicine is only for you. Do not share this medicine with others. What if I miss a dose? Keep appointments for follow-up doses. It is important not to miss your dose. Call your care team if you are unable to keep an appointment. What may interact with this medication? Interactions have not been studied. This list may not describe all possible interactions. Give your health care provider a list of all the medicines, herbs, non-prescription drugs, or dietary supplements you use. Also tell them if you smoke, drink alcohol, or use illegal drugs. Some items may interact with your medicine. What should I watch for while using this medication? This medication may make you feel generally unwell. This is not uncommon as chemotherapy can affect healthy cells as well as cancer cells. Report any side effects. Continue your course of treatment even though you feel ill until your care team tells you to stop. You may need blood work done while you are taking this medication. This medication may cause serious skin reactions. They can happen in the weeks to months after starting the medication. Contact your care  team right away if you notice fevers or flu-like symptoms with a rash. The rash may be red or purple and then turn into blisters or peeling of the skin. You may also notice a red rash with swelling of the face, lips, or lymph nodes in your neck or under your arms. Tell your care team right away if you have any changes in your vision. This medication may increase blood sugar. The risk may be higher in patients who already have diabetes. Ask your care team what you can do to lower your risk of diabetes while taking this medication. Talk to your care team if you wish to become pregnant or think you might be pregnant. This medication can cause serious birth defects if taken during pregnancy. A negative pregnancy test is required before starting this medication. A reliable form of contraception is recommended while taking this medication and for at least 4 months after stopping it. Do not breast-feed while taking this medication and for at least 4 months after stopping it. What side effects may I notice from receiving this medication? Side effects that you should report to your care team as soon as possible: Allergic reactions--skin rash, itching, hives, swelling of the face, lips, tongue, or throat Change in vision Dry cough, shortness of breath  or trouble breathing Fever, neck pain or stiffness, sensitivity to light, headache, nausea, vomiting, confusion Headache, double vision, blurry vision, sensitivity to light, eye pain Heart muscle inflammation--unusual weakness or fatigue, shortness of breath, chest pain, fast or irregular heartbeat, dizziness, swelling of the ankles, feet, or hands High blood sugar (hyperglycemia)--increased thirst or amount of urine, unusual weakness or fatigue, blurry vision High thyroid levels (hyperthyroidism)--fast or irregular heartbeat, weight loss, excessive sweating or sensitivity to heat, tremors or shaking, anxiety, nervousness, irregular menstrual cycle or  spotting Infusion reactions--chest pain, shortness of breath or trouble breathing, feeling faint or lightheaded Kidney injury (glomerulonephritis)--decrease in the amount of urine, red or dark brown urine, foamy or bubbly urine, swelling of the ankles, hands, or feet Liver injury--right upper belly pain, loss of appetite, nausea, light-colored stool, dark yellow or brown urine, yellowing skin or eyes, unusual weakness or fatigue Low adrenal gland function--nausea, vomiting, loss of appetite, unusual weakness or fatigue, dizziness Low red blood cell level--unusual weakness or fatigue, dizziness, headache, trouble breathing Low thyroid levels (hypothyroidism)--unusual weakness or fatigue, increased sensitivity to cold, constipation, hair loss, dry skin, weight gain, feelings of depression Pain, tingling, or numbness in the hands or feet, muscle weakness, trouble walking, loss of balance or coordination Rash, fever, and swollen lymph nodes Redness, blistering, peeling, or loosening of the skin, including inside the mouth Sudden or severe stomach pain, bloody diarrhea, fever, nausea, vomiting Unusual bruising or bleeding Side effects that usually do not require medical attention (report these to your care team if they continue or are bothersome): Bone pain Diarrhea Muscle pain This list may not describe all possible side effects. Call your doctor for medical advice about side effects. You may report side effects to FDA at 1-800-FDA-1088. Where should I keep my medication? This medication is given in a hospital or clinic and will not be stored at home. NOTE: This sheet is a summary. It may not cover all possible information. If you have questions about this medicine, talk to your doctor, pharmacist, or health care provider.  2023 Elsevier/Gold Standard (2021-02-14 00:00:00)

## 2021-10-21 NOTE — Progress Notes (Signed)
[facility] OFFICE PROGRESS NOTE  [name], NP [address] [place] [address]  DIAGNOSIS:  Stage IV (T3, N0, M1 a) non-small cell lung cancer, adenocarcinoma presented with large left lower lobe lung mass in addition to another lingual mass and malignant left pleural effusion diagnosed in September 2021   Biomarker Findings Tumor Mutational Burden - 13 Muts/Mb Microsatellite status - MS-Stable Genomic Findings For a complete list of the genes assayed, please refer to the Appendix. KRAS G12C PRDM1 D224f*46 TERT promoter -146C>T TP53 V157F 7 Disease relevant genes with no reportable alterations: ALK, BRAF, EGFR, ERBB2, MET, RET, ROS1  PDL1 Expression: 95%  PRIOR THERAPY: Palliative radiotherapy to the enlarging right axillary lymphadenopathy under the care of Dr. MLisbeth Renshaw Completed on 07/11/21  CURRENT THERAPY:  Libtayo (Cempilimab) 350 mg IV every 3 weeks.  First dose January 15, 2020.  Status post 31 cycles.  INTERVAL HISTORY: Gabrielle [id number].o. female returns to the clinic today for a follow-up visit accompanied by her husband.  The patient is currently undergoing systemic treatment with immunotherapy with Libtayo.  She completed palliative radiation to the enlarging right axillary lymph node on 07/11/2021 under the care of Dr. MLisbeth Renshaw Otherwise, the patient is feeling well today without any concerning complaints.  She denies any fever, chills, night sweats, or unexplained weight loss.  She denies any shortness of breath, significant cough, hemoptysis, or chest pain.  Denies any nausea, vomiting, recent diarrhea, or constipation.  Denies any rashes or skin changes at this time. She previously had some rashes associated with immunotherapy. Denies any headache or visual changes. She reports the right axillary lymph node that was previously treated with radiation is stable.  She is here today for evaluation and repeat blood work before undergoing cycle  #32     MEDICAL HISTORY: Past Medical History:  Diagnosis Date   Arthritis    Bilateral   Hyperlipidemia    Hypertension    Pleural effusion    Pneumonia    Stage IV adenocarcinoma of lung, left (HCC)     ALLERGIES:  is allergic to ace inhibitors.  MEDICATIONS:  Current Outpatient Medications  Medication Sig Dispense Refill   cholecalciferol (VITAMIN D3) 25 MCG (1000 UNIT) tablet Take 2,000 Units by mouth daily.     levothyroxine (SYNTHROID) 50 MCG tablet TAKE 1 TABLET BY MOUTH EVERY DAY BEFORE BREAKFAST 90 tablet 1   metFORMIN (GLUCOPHAGE) 500 MG tablet Take 500 mg by mouth 2 (two) times daily with a meal.     pravastatin (PRAVACHOL) 40 MG tablet Take 40 mg by mouth daily.     sertraline (ZOLOFT) 50 MG tablet Take 75 mg by mouth daily.     No current facility-administered medications for this visit.    SURGICAL HISTORY:  Past Surgical History:  Procedure Laterality Date   NEPHRECTOMY Left     REVIEW OF SYSTEMS:   Constitutional: Negative for appetite change, chills, fatigue, fever and unexpected weight change.  HENT:  Negative for mouth sores, nosebleeds, sore throat and trouble swallowing.   Eyes: Negative for eye problems and icterus.  Respiratory:  Negative for hemoptysis, cough, shortness of breath, and wheezing.   Cardiovascular: Negative for chest pain. Positive for bilateral lower extremity swelling.  Gastrointestinal: Negative for abdominal pain, constipation, diarrhea, nausea and vomiting.  Genitourinary: Negative for bladder incontinence, difficulty urinating, dysuria, frequency and hematuria.   Musculoskeletal: Negative for back pain, gait problem, neck pain and neck stiffness.  Skin:  Negative for itching and rash.  Neurological: Negative for dizziness, extremity weakness, gait problem, headaches, light-headedness and seizures.  Hematological: Negative for adenopathy. Does not bruise/bleed easily.  Psychiatric/Behavioral: Negative for confusion, depression  and sleep disturbance. The patient is not nervous/anxious.    PHYSICAL EXAMINATION:  Blood pressure 123/73, pulse (!) 55, temperature (!) 97.5 F (36.4 C), temperature source Temporal, resp. rate 14, height '5\' 4"'  (1.626 m), weight 134 lb 3.2 oz (60.9 kg), SpO2 100 %.  ECOG PERFORMANCE STATUS: 1  Physical Exam  Constitutional: Oriented to person, place, and time and well-developed, well-nourished, and in no distress.  HENT:  Head: Normocephalic and atraumatic.  Mouth/Throat: Oropharynx is clear and moist. No oropharyngeal exudate. Positive for nasal congestion.  Eyes: Conjunctivae are normal. Right eye exhibits no discharge. Left eye exhibits no discharge. No scleral icterus.  Neck: Normal range of motion. Neck supple.  Cardiovascular: Bradycardic, regular rhythm, normal heart sounds and intact distal pulses.   Pulmonary/Chest: Effort normal and breath sounds normal. No respiratory distress. No wheezes. No rales.  Abdominal: Soft. Bowel sounds are normal. Exhibits no distension and no mass. There is no tenderness.  Musculoskeletal: Normal range of motion. Bilateral lower extremity swelling.  Lymphadenopathy:    No cervical adenopathy.  Neurological: Alert and oriented to person, place, and time. Exhibits normal muscle tone. Gait normal. Coordination normal.  Skin: fungal infection in her thumb of her right hand. Skin is warm and dry. No rash noted. Not diaphoretic. No erythema. No pallor.  Psychiatric: Mood, memory and judgment normal.  Vitals reviewed.  LABORATORY DATA: Lab Results  Component Value Date   WBC 5.5 10/28/2021   HGB 10.0 (L) 10/28/2021   HCT 32.2 (L) 10/28/2021   MCV 84.7 10/28/2021   PLT 306 10/28/2021      Chemistry      Component Value Date/Time   NA 139 10/07/2021 0958   K 3.7 10/07/2021 0958   CL 109 10/07/2021 0958   CO2 23 10/07/2021 0958   BUN 23 10/07/2021 0958   CREATININE 0.81 10/07/2021 0958      Component Value Date/Time   CALCIUM 8.8 (L)  10/07/2021 0958   ALKPHOS 64 10/07/2021 0958   AST 17 10/07/2021 0958   ALT 14 10/07/2021 0958   BILITOT 0.3 10/07/2021 0958       RADIOGRAPHIC STUDIES:  CT CHEST ABDOMEN PELVIS W CONTRAST  Result Date: 10/03/2021 CLINICAL DATA:  A 66 year old female presents for follow-up of metastatic non-small cell lung cancer. * Tracking Code: BO * EXAM: CT CHEST, ABDOMEN, AND PELVIS WITH CONTRAST TECHNIQUE: Multidetector CT imaging of the chest, abdomen and pelvis was performed following the standard protocol during bolus administration of intravenous contrast. RADIATION DOSE REDUCTION: This exam was performed according to the departmental dose-optimization program which includes automated exposure control, adjustment of the mA and/or kV according to patient size and/or use of iterative reconstruction technique. CONTRAST:  57m OMNIPAQUE IOHEXOL 300 MG/ML  SOLN COMPARISON:  Jul 28, 2021. FINDINGS: CT CHEST FINDINGS Cardiovascular: Calcified and noncalcified atherosclerotic plaque in the thoracic aorta is moderate without change. Normal heart size. Normal caliber central pulmonary vessels. Shift of mediastinal structures into the LEFT chest following post treatment changes and related volume loss. Mediastinum/Nodes: Pre pericardial lymph node (image 33/2) 12 mm previously 11 mm not substantially changed since February of 2023. No signs of adenopathy elsewhere in the chest. RIGHT axillary lymph nodes seen on previous imaging measure at most 7 mm for the dominant lymph node (image 21/2) previously  10 mm in May of 2023 and frank enlargement noted in February. Small hiatal hernia. Lungs/Pleura: Pleural nodularity/thickening has an appearance more suggestive of rounded atelectasis along the LEFT chest wall pleural interface with swirling of vessels passing into the area of airspace disease. This is in the LEFT upper lobe. In the LEFT lower lobe there is likewise a nodular appearing area with bronchovascular distortion  and chronic appearing pleural thickening and small chronic pleural effusion likewise without substantial change from previous imaging. The airways are patent in the LEFT chest Scattered areas of ground-glass in the periphery of the RIGHT lower lobe have developed since previous imaging, for instance on image 83 of series 4 there is a 9 x 7 mm area of ground-glass nodularity. Other areas of bronchovascular nodularity are present in this region. Musculoskeletal: See below for full musculoskeletal details. CT ABDOMEN PELVIS FINDINGS Hepatobiliary: Hepatic contours are smooth. No visible lesion. No pericholecystic stranding. Portal vein is patent. No biliary duct dilation. Pancreas: Mild to moderate atrophy of the pancreas without signs of inflammation or visible lesion. Spleen: Normal. Adrenals/Urinary Tract: Adrenal glands are normal. Post LEFT nephrectomy. RIGHT kidney with normal enhancement and without suspicious renal lesion. Urinary bladder decompressed limiting assessment. Stomach/Bowel: No stranding adjacent to the stomach. No sign of small bowel obstruction or acute small bowel process. Stool in much of the colon. No signs of colonic inflammation. Appendix is normal. Cecum is midline likely representing mobile cecum but without signs of obstruction Vascular/Lymphatic: Aortic atherosclerosis. No sign of aneurysm. Smooth contour of the IVC. There is no gastrohepatic or hepatoduodenal ligament lymphadenopathy. No retroperitoneal or mesenteric lymphadenopathy. No pelvic sidewall lymphadenopathy. Reproductive: Unremarkable by CT. Other: No ascites.  No pneumoperitoneum.  No mesenteric stranding. Musculoskeletal: Spinal degenerative changes. No acute or destructive bone process. IMPRESSION: 1. Stable post treatment changes in the LEFT chest with chronic effusion and suspected areas of rounded atelectasis. 2. Unenlarged RIGHT axillary lymph nodes remain mildly prominent but continue to diminish in size. 3. Stable pre  pericardial lymph node in the lower chest anterior to the RIGHT heart. Attention on follow-up. 4. New areas of peribronchovascular ground-glass and tree in bud favored to represent post inflammatory or post infectious changes. Correlate with respiratory symptoms and suggest attention on follow-up. 5. Post LEFT nephrectomy. No signs of metastatic disease to the abdomen or pelvis. Aortic Atherosclerosis (ICD10-I70.0). Electronically Signed   By: Zetta Bills M.D.   On: 10/03/2021 16:54     ASSESSMENT/PLAN:  This is a very pleasant 67 year old Caucasian female diagnosed with stage IV (T3, N0, M1A) non-small cell lung cancer, adenocarcinoma.  She presented with a large left lower lobe lung mass in addition to another lingular mass and a malignant left pleural effusion.  She was diagnosed in September 2021.  Her molecular studies show that she has a PD-L1 expression at 95%.  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   The patient is currently undergoing treatment with single agent Libtayo (Cempilimab) 350 mg IV every 3 weeks for up to 2 years unless the patient has evidence for disease progression or unacceptable toxicity.  She is status post 31 cycles and tolerated it well without any concerning adverse side effects.    The patient underwent radiation to the enlarging right axillary lymph node under the care of Dr. Lisbeth Renshaw in the next few days. Her last day of radiation was on 07/11/21.    Labs were reviewed. Recommend that  she proceed with cycle #32 today scheduled as long as her CMP is within parameters. Her CMP is pending at this time  We will see her back for follow-up visit in 3 weeks for evaluation and repeat blood work before starting cycle #33.  The patient was advised to call immediately if she has any concerning symptoms in the interval. The patient voices understanding of current disease status and treatment options and is in agreement with the  current care plan. All questions were answered. The patient knows to call the clinic with any problems, questions or concerns. We can certainly see the patient much sooner if necessary    No orders of the defined types were placed in this encounter.    The total time spent in the appointment was 20-29 minutes.   Zaiya Annunziato L Eulanda Dorion, PA-C 10/28/21

## 2021-10-28 ENCOUNTER — Other Ambulatory Visit: Payer: Medicare Other

## 2021-10-28 ENCOUNTER — Inpatient Hospital Stay (HOSPITAL_BASED_OUTPATIENT_CLINIC_OR_DEPARTMENT_OTHER): Payer: Medicare Other | Admitting: Physician Assistant

## 2021-10-28 ENCOUNTER — Ambulatory Visit: Payer: Medicare Other

## 2021-10-28 ENCOUNTER — Other Ambulatory Visit: Payer: Self-pay

## 2021-10-28 ENCOUNTER — Ambulatory Visit: Payer: Medicare Other | Admitting: Physician Assistant

## 2021-10-28 ENCOUNTER — Inpatient Hospital Stay: Payer: Medicare Other

## 2021-10-28 VITALS — BP 123/73 | HR 55 | Temp 97.5°F | Resp 14 | Ht 64.0 in | Wt 134.2 lb

## 2021-10-28 DIAGNOSIS — Z5112 Encounter for antineoplastic immunotherapy: Secondary | ICD-10-CM

## 2021-10-28 DIAGNOSIS — C3492 Malignant neoplasm of unspecified part of left bronchus or lung: Secondary | ICD-10-CM | POA: Diagnosis not present

## 2021-10-28 DIAGNOSIS — R5383 Other fatigue: Secondary | ICD-10-CM

## 2021-10-28 LAB — CBC WITH DIFFERENTIAL (CANCER CENTER ONLY)
Abs Immature Granulocytes: 0.03 10*3/uL (ref 0.00–0.07)
Basophils Absolute: 0.1 10*3/uL (ref 0.0–0.1)
Basophils Relative: 2 %
Eosinophils Absolute: 0.2 10*3/uL (ref 0.0–0.5)
Eosinophils Relative: 4 %
HCT: 32.2 % — ABNORMAL LOW (ref 36.0–46.0)
Hemoglobin: 10 g/dL — ABNORMAL LOW (ref 12.0–15.0)
Immature Granulocytes: 1 %
Lymphocytes Relative: 14 %
Lymphs Abs: 0.8 10*3/uL (ref 0.7–4.0)
MCH: 26.3 pg (ref 26.0–34.0)
MCHC: 31.1 g/dL (ref 30.0–36.0)
MCV: 84.7 fL (ref 80.0–100.0)
Monocytes Absolute: 0.3 10*3/uL (ref 0.1–1.0)
Monocytes Relative: 6 %
Neutro Abs: 4.1 10*3/uL (ref 1.7–7.7)
Neutrophils Relative %: 73 %
Platelet Count: 306 10*3/uL (ref 150–400)
RBC: 3.8 MIL/uL — ABNORMAL LOW (ref 3.87–5.11)
RDW: 15.9 % — ABNORMAL HIGH (ref 11.5–15.5)
WBC Count: 5.5 10*3/uL (ref 4.0–10.5)
nRBC: 0 % (ref 0.0–0.2)

## 2021-10-28 LAB — TSH: TSH: 2.264 u[IU]/mL (ref 0.350–4.500)

## 2021-10-28 LAB — CMP (CANCER CENTER ONLY)
ALT: 17 U/L (ref 0–44)
AST: 15 U/L (ref 15–41)
Albumin: 3.5 g/dL (ref 3.5–5.0)
Alkaline Phosphatase: 68 U/L (ref 38–126)
Anion gap: 5 (ref 5–15)
BUN: 22 mg/dL (ref 8–23)
CO2: 26 mmol/L (ref 22–32)
Calcium: 9.1 mg/dL (ref 8.9–10.3)
Chloride: 107 mmol/L (ref 98–111)
Creatinine: 0.84 mg/dL (ref 0.44–1.00)
GFR, Estimated: >60 mL/min (ref >60)
Glucose, Bld: 167 mg/dL — ABNORMAL HIGH (ref 70–99)
Potassium: 3.8 mmol/L (ref 3.5–5.1)
Sodium: 138 mmol/L (ref 135–145)
Total Bilirubin: 0.4 mg/dL (ref 0.3–1.2)
Total Protein: 6.6 g/dL (ref 6.5–8.1)

## 2021-10-28 MED ORDER — SODIUM CHLORIDE 0.9 % IV SOLN
350.0000 mg | Freq: Once | INTRAVENOUS | Status: AC
  Administered 2021-10-28: 350 mg via INTRAVENOUS
  Filled 2021-10-28: qty 7

## 2021-10-28 MED ORDER — SODIUM CHLORIDE 0.9 % IV SOLN: Freq: Once | INTRAVENOUS | Status: AC

## 2021-10-28 NOTE — Patient Instructions (Signed)
[facility]  Discharge Instructions: Thank you for choosing [facility] to provide your oncology and hematology care.   If you have a lab appointment with the [facility], please go directly to the [facility] and check in at the registration area.   Wear comfortable clothing and clothing appropriate for easy access to any Portacath or PICC line.   We strive to give you quality time with your provider. You may need to reschedule your appointment if you arrive late (15 or more minutes).  Arriving late affects you and other patients whose appointments are after yours.  Also, if you miss three or more appointments without notifying the office, you may be dismissed from the clinic at the provider's discretion.      For prescription refill requests, have your pharmacy contact our office and allow 72 hours for refills to be completed.    Today you received the following chemotherapy and/or immunotherapy agents: Cemiplimab.       To help prevent nausea and vomiting after your treatment, we encourage you to take your nausea medication as directed.  BELOW ARE SYMPTOMS THAT SHOULD BE REPORTED IMMEDIATELY: *FEVER GREATER THAN 100.4 F (38 C) OR HIGHER *CHILLS OR SWEATING *NAUSEA AND VOMITING THAT IS NOT CONTROLLED WITH YOUR NAUSEA MEDICATION *UNUSUAL SHORTNESS OF BREATH *UNUSUAL BRUISING OR BLEEDING *URINARY PROBLEMS (pain or burning when urinating, or frequent urination) *BOWEL PROBLEMS (unusual diarrhea, constipation, pain near the anus) TENDERNESS IN MOUTH AND THROAT WITH OR WITHOUT PRESENCE OF ULCERS (sore throat, sores in mouth, or a toothache) UNUSUAL RASH, SWELLING OR PAIN  UNUSUAL VAGINAL DISCHARGE OR ITCHING   Items with * indicate a potential emergency and should be followed up as soon as possible or go to the Emergency Department if any problems should occur.  Please show the CHEMOTHERAPY ALERT CARD or IMMUNOTHERAPY ALERT CARD at check-in  to the Emergency Department and triage nurse.  Should you have questions after your visit or need to cancel or reschedule your appointment, please contact [facility]  Dept: [phone]  and follow the prompts.  Office hours are 8:00 a.m. to 4:30 p.m. Monday - Friday. Please note that voicemails left after 4:00 p.m. may not be returned until the following business day.  We are closed weekends and major holidays. You have access to a nurse at all times for urgent questions. Please call the main number to the clinic Dept: [phone] and follow the prompts.   For any non-urgent questions, you may also contact your provider using MyChart. We now offer e-Visits for anyone 48 and older to request care online for non-urgent symptoms. For details visit [email or web address].   Also download the MyChart app! Go to the app store, search "MyChart", open the app, select [facility], and log in with your MyChart username and password.  Masks are optional in the cancer centers. If you would like for your care team to wear a mask while they are taking care of you, please let them know. For doctor visits, patients may have with them one support Cayne Yom who is at least 67 years old. At this time, visitors are not allowed in the infusion area. Cemiplimab Injection What is this medication? CEMIPLIMAB (se MIP li mab) treats skin cancer and lung cancer. It works by blocking a protein that causes cancer cells to grow and multiply. This helps to slow or stop the spread of cancer cells. It is a monoclonal antibody. This medicine may  be used for other purposes; ask your health care provider or pharmacist if you have questions. COMMON BRAND NAME(S): LIBTAYO What should I tell my care team before I take this medication? They need to know if you have any of these conditions: Allogeneic stem cell transplant (uses someone else's stem cells) Autoimmune diseases, such as Crohn's disease,  ulcerative colitis, or lupus Diabetes Nervous system problems, such as myasthenia gravis or Guillain-Barre syndrome Organ transplant Recent or ongoing radiation Thyroid disease An unusual or allergic reaction to cemiplimab, other medications, foods, dyes, or preservatives Pregnant or trying to get pregnant Breast-feeding How should I use this medication? This medication is injected into a vein. It is given by your care team in a hospital or clinic setting. A special MedGuide will be given to you before each treatment. Be sure to read this information carefully each time. Talk to your care team about the use of this medication in children. Special care may be needed. Overdosage: If you think you have taken too much of this medicine contact a poison control center or emergency room at once. NOTE: This medicine is only for you. Do not share this medicine with others. What if I miss a dose? Keep appointments for follow-up doses. It is important not to miss your dose. Call your care team if you are unable to keep an appointment. What may interact with this medication? Interactions have not been studied. This list may not describe all possible interactions. Give your health care provider a list of all the medicines, herbs, non-prescription drugs, or dietary supplements you use. Also tell them if you smoke, drink alcohol, or use illegal drugs. Some items may interact with your medicine. What should I watch for while using this medication? This medication may make you feel generally unwell. This is not uncommon as chemotherapy can affect healthy cells as well as cancer cells. Report any side effects. Continue your course of treatment even though you feel ill until your care team tells you to stop. You may need blood work done while you are taking this medication. This medication may cause serious skin reactions. They can happen in the weeks to months after starting the medication. Contact your care  team right away if you notice fevers or flu-like symptoms with a rash. The rash may be red or purple and then turn into blisters or peeling of the skin. You may also notice a red rash with swelling of the face, lips, or lymph nodes in your neck or under your arms. Tell your care team right away if you have any changes in your vision. This medication may increase blood sugar. The risk may be higher in patients who already have diabetes. Ask your care team what you can do to lower your risk of diabetes while taking this medication. Talk to your care team if you wish to become pregnant or think you might be pregnant. This medication can cause serious birth defects if taken during pregnancy. A negative pregnancy test is required before starting this medication. A reliable form of contraception is recommended while taking this medication and for at least 4 months after stopping it. Do not breast-feed while taking this medication and for at least 4 months after stopping it. What side effects may I notice from receiving this medication? Side effects that you should report to your care team as soon as possible: Allergic reactions--skin rash, itching, hives, swelling of the face, lips, tongue, or throat Change in vision Dry cough, shortness of breath  or trouble breathing Fever, neck pain or stiffness, sensitivity to light, headache, nausea, vomiting, confusion Headache, double vision, blurry vision, sensitivity to light, eye pain Heart muscle inflammation--unusual weakness or fatigue, shortness of breath, chest pain, fast or irregular heartbeat, dizziness, swelling of the ankles, feet, or hands High blood sugar (hyperglycemia)--increased thirst or amount of urine, unusual weakness or fatigue, blurry vision High thyroid levels (hyperthyroidism)--fast or irregular heartbeat, weight loss, excessive sweating or sensitivity to heat, tremors or shaking, anxiety, nervousness, irregular menstrual cycle or  spotting Infusion reactions--chest pain, shortness of breath or trouble breathing, feeling faint or lightheaded Kidney injury (glomerulonephritis)--decrease in the amount of urine, red or dark brown urine, foamy or bubbly urine, swelling of the ankles, hands, or feet Liver injury--right upper belly pain, loss of appetite, nausea, light-colored stool, dark yellow or brown urine, yellowing skin or eyes, unusual weakness or fatigue Low adrenal gland function--nausea, vomiting, loss of appetite, unusual weakness or fatigue, dizziness Low red blood cell level--unusual weakness or fatigue, dizziness, headache, trouble breathing Low thyroid levels (hypothyroidism)--unusual weakness or fatigue, increased sensitivity to cold, constipation, hair loss, dry skin, weight gain, feelings of depression Pain, tingling, or numbness in the hands or feet, muscle weakness, trouble walking, loss of balance or coordination Rash, fever, and swollen lymph nodes Redness, blistering, peeling, or loosening of the skin, including inside the mouth Sudden or severe stomach pain, bloody diarrhea, fever, nausea, vomiting Unusual bruising or bleeding Side effects that usually do not require medical attention (report these to your care team if they continue or are bothersome): Bone pain Diarrhea Muscle pain This list may not describe all possible side effects. Call your doctor for medical advice about side effects. You may report side effects to FDA at 1-800-FDA-1088. Where should I keep my medication? This medication is given in a hospital or clinic and will not be stored at home. NOTE: This sheet is a summary. It may not cover all possible information. If you have questions about this medicine, talk to your doctor, pharmacist, or health care provider.  2023 Elsevier/Gold Standard (2021-02-14 00:00:00)

## 2021-10-29 ENCOUNTER — Other Ambulatory Visit: Payer: Self-pay

## 2021-11-06 IMAGING — CT NM PET TUM IMG INITIAL (PI) SKULL BASE T - THIGH
1 of 7 series · 1 of 25 positions shown · non-contrast
Comparison: None.

CLINICAL DATA: Initial treatment strategy for non-small cell left
lung cancer.

EXAM:
NUCLEAR MEDICINE PET SKULL BASE TO THIGH
TECHNIQUE: 8.2 mCi F-18 FDG was injected intravenously. Full-ring PET imaging
was performed from the skull base to thigh after the radiotracer. CT
data was obtained and used for attenuation correction and anatomic
localization.
Fasting blood glucose: 118 mg/dl

[Series 4: ct sk_thigh 5.0 bf37 · axial · 5.0mm · 0.91mm/px · 1 of 228 slices shown]
[im 228/228  brain]
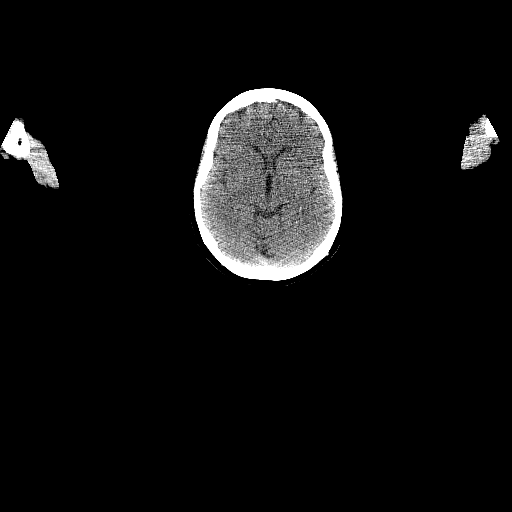

[1 of 25 positions shown; findings below may reference images not displayed]

FINDINGS: Mediastinal blood pool activity: SUV max

Liver activity: SUV max NA

NECK: There are small hypermetabolic soft tissue nodules in the
parotid glands bilaterally, largest 1.4 x 0.9 cm on the right with
max SUV 8.5 (series 4/image 21) and 0.9 x 0.6 cm on the left with
max SUV 4.0 (series 4/image 23).

No enlarged or hypermetabolic lymph nodes in the neck.

Incidental CT findings: none

CHEST:

Large left pleural effusion with diffuse left pleural thickening and
hypermetabolism. Representative focus of hypermetabolism in the
medial basilar left pleural space with max SUV 11.1.

Suggestion of a hypermetabolic 5.3 x 5.1 cm centrally necrotic left
lower lobe lung mass with max SUV 12.3 (series 4/image 86). Complete
left lower lobe atelectasis. Suggestion of a separate 2.7 cm
hypermetabolic pulmonary nodule in the lingula with max SUV
(series 4/image 76). Complete lingular atelectasis.

No enlarged or hypermetabolic axillary, mediastinal or hilar lymph
nodes. No hypermetabolic right pulmonary findings.

Incidental CT findings: Coronary atherosclerosis. Atherosclerotic
nonaneurysmal thoracic aorta. No significant right pulmonary
nodules.

ABDOMEN/PELVIS: No abnormal hypermetabolic activity within the
liver, pancreas, adrenal glands, or spleen. No hypermetabolic lymph
nodes in the abdomen or pelvis.

Incidental CT findings: Left nephrectomy. Atherosclerotic
nonaneurysmal abdominal aorta. Mild sigmoid diverticulosis.

SKELETON: No focal hypermetabolic activity to suggest skeletal
metastasis.

Incidental CT findings: none
IMPRESSION: 1. Suggestion of a hypermetabolic 5.3 cm centrally necrotic left
lower lobe lung mass, compatible with primary bronchogenic
carcinoma. Suggestion of a hypermetabolic 2.7 cm pulmonary nodule in
the lingula, suspicious for metastasis. Complete left lower lobe and
lingular atelectasis.
2. Large malignant left pleural effusion with diffuse left pleural
thickening and hypermetabolism.
3. No hypermetabolic thoracic adenopathy or extrathoracic metastatic
disease.
4. Hypermetabolic small bilateral parotid gland nodules, largest
cm on the right, most suggestive of Warthin's tumors.
5. Chronic findings include: Aortic Atherosclerosis (XY3P3-[id number].R).
Coronary atherosclerosis. Mild sigmoid diverticulosis.

## 2021-11-07 ENCOUNTER — Other Ambulatory Visit: Payer: Self-pay

## 2021-11-12 ENCOUNTER — Telehealth: Payer: Self-pay | Admitting: Internal Medicine

## 2021-11-12 NOTE — Telephone Encounter (Signed)
Called patient regarding 10/24 rescheduled appointment, patient is notified.

## 2021-11-13 ENCOUNTER — Other Ambulatory Visit: Payer: Self-pay

## 2021-11-14 ENCOUNTER — Other Ambulatory Visit: Payer: Self-pay

## 2021-11-18 ENCOUNTER — Inpatient Hospital Stay (HOSPITAL_BASED_OUTPATIENT_CLINIC_OR_DEPARTMENT_OTHER): Payer: Medicare Other | Admitting: Internal Medicine

## 2021-11-18 ENCOUNTER — Other Ambulatory Visit: Payer: Self-pay

## 2021-11-18 ENCOUNTER — Inpatient Hospital Stay: Payer: Medicare Other

## 2021-11-18 ENCOUNTER — Inpatient Hospital Stay: Payer: Medicare Other | Attending: Internal Medicine

## 2021-11-18 VITALS — BP 130/64 | HR 58 | Temp 97.6°F | Resp 15 | Ht 64.0 in | Wt 132.0 lb

## 2021-11-18 DIAGNOSIS — C3432 Malignant neoplasm of lower lobe, left bronchus or lung: Secondary | ICD-10-CM | POA: Insufficient documentation

## 2021-11-18 DIAGNOSIS — Z905 Acquired absence of kidney: Secondary | ICD-10-CM | POA: Insufficient documentation

## 2021-11-18 DIAGNOSIS — Z5112 Encounter for antineoplastic immunotherapy: Secondary | ICD-10-CM | POA: Insufficient documentation

## 2021-11-18 DIAGNOSIS — Z79899 Other long term (current) drug therapy: Secondary | ICD-10-CM | POA: Insufficient documentation

## 2021-11-18 DIAGNOSIS — J910 Malignant pleural effusion: Secondary | ICD-10-CM | POA: Diagnosis not present

## 2021-11-18 DIAGNOSIS — C3492 Malignant neoplasm of unspecified part of left bronchus or lung: Secondary | ICD-10-CM

## 2021-11-18 DIAGNOSIS — R5383 Other fatigue: Secondary | ICD-10-CM

## 2021-11-18 LAB — CMP (CANCER CENTER ONLY)
ALT: 14 U/L (ref 0–44)
AST: 16 U/L (ref 15–41)
Albumin: 3.7 g/dL (ref 3.5–5.0)
Alkaline Phosphatase: 68 U/L (ref 38–126)
Anion gap: 8 (ref 5–15)
BUN: 28 mg/dL — ABNORMAL HIGH (ref 8–23)
CO2: 25 mmol/L (ref 22–32)
Calcium: 9.6 mg/dL (ref 8.9–10.3)
Chloride: 108 mmol/L (ref 98–111)
Creatinine: 1.05 mg/dL — ABNORMAL HIGH (ref 0.44–1.00)
GFR, Estimated: 59 mL/min — ABNORMAL LOW (ref >60)
Glucose, Bld: 209 mg/dL — ABNORMAL HIGH (ref 70–99)
Potassium: 3.8 mmol/L (ref 3.5–5.1)
Sodium: 141 mmol/L (ref 135–145)
Total Bilirubin: 0.3 mg/dL (ref 0.3–1.2)
Total Protein: 7.3 g/dL (ref 6.5–8.1)

## 2021-11-18 LAB — CBC WITH DIFFERENTIAL (CANCER CENTER ONLY)
Abs Immature Granulocytes: 0.01 10*3/uL (ref 0.00–0.07)
Basophils Absolute: 0.1 10*3/uL (ref 0.0–0.1)
Basophils Relative: 1 %
Eosinophils Absolute: 0.3 10*3/uL (ref 0.0–0.5)
Eosinophils Relative: 6 %
HCT: 34 % — ABNORMAL LOW (ref 36.0–46.0)
Hemoglobin: 10.5 g/dL — ABNORMAL LOW (ref 12.0–15.0)
Immature Granulocytes: 0 %
Lymphocytes Relative: 15 %
Lymphs Abs: 0.8 10*3/uL (ref 0.7–4.0)
MCH: 26.6 pg (ref 26.0–34.0)
MCHC: 30.9 g/dL (ref 30.0–36.0)
MCV: 86.3 fL (ref 80.0–100.0)
Monocytes Absolute: 0.4 10*3/uL (ref 0.1–1.0)
Monocytes Relative: 8 %
Neutro Abs: 3.7 10*3/uL (ref 1.7–7.7)
Neutrophils Relative %: 70 %
Platelet Count: 305 10*3/uL (ref 150–400)
RBC: 3.94 MIL/uL (ref 3.87–5.11)
RDW: 15.4 % (ref 11.5–15.5)
WBC Count: 5.4 10*3/uL (ref 4.0–10.5)
nRBC: 0 % (ref 0.0–0.2)

## 2021-11-18 LAB — TSH: TSH: 5.062 u[IU]/mL — ABNORMAL HIGH (ref 0.350–4.500)

## 2021-11-18 MED ORDER — SODIUM CHLORIDE 0.9 % IV SOLN
350.0000 mg | Freq: Once | INTRAVENOUS | Status: AC
  Administered 2021-11-18: 350 mg via INTRAVENOUS
  Filled 2021-11-18: qty 7

## 2021-11-18 MED ORDER — SODIUM CHLORIDE 0.9 % IV SOLN: Freq: Once | INTRAVENOUS | Status: AC

## 2021-11-18 NOTE — Progress Notes (Signed)
    [facility] [phone]   [phone]  OFFICE PROGRESS NOTE  Moon, Amy A, NP [address] [place] [address]  DIAGNOSIS: stage IV (T3, N0, M1 a) non-small cell lung cancer, adenocarcinoma presented with large left lower lobe lung mass in addition to another lingual mass and malignant left pleural effusion diagnosed in September 2021  Biomarker Findings Tumor Mutational Burden - 13 Muts/Mb Microsatellite status - MS-Stable Genomic Findings For a complete list of the genes assayed, please refer to the Appendix. KRAS G12C PRDM1 D265fs*46 TERT promoter -146C>T TP53 V157F 7 Disease relevant genes with no reportable alterations: ALK, BRAF, EGFR, ERBB2, MET, RET, ROS1   PDL1 Expression: 95%  PRIOR THERAPY: Palliative radiotherapy to the enlarging right axillary lymphadenopathy under the care of Dr. Moody.  CURRENT THERAPY: Libtayo (Cempilimab) 350 mg IV every 3 weeks.  First dose January 15, 2020.  Status post 32 cycles.  [name] 66 y.o. female returns to the clinic today for follow-up visit accompanied by her husband.  The patient is feeling fine today with no concerning complaints except for the swelling of the knees and she is scheduled to see orthopedic surgery for evaluation and consideration of drainage of the knee fluid.  She denied having any current chest pain, shortness of breath, cough or hemoptysis.  She has no nausea, vomiting, diarrhea or constipation.  She has no headache or visual changes.  She denied having any recent weight loss or night sweats.  She is here today for evaluation before starting cycle #33 of her treatment.   MEDICAL HISTORY: Past Medical History:  Diagnosis Date   Arthritis    Bilateral   Hyperlipidemia    Hypertension    Pleural effusion    Pneumonia    Stage IV adenocarcinoma of lung, left (HCC)     ALLERGIES:  is allergic to ace inhibitors.  MEDICATIONS:   Current Outpatient Medications  Medication Sig Dispense Refill   cholecalciferol (VITAMIN D3) 25 MCG (1000 UNIT) tablet Take 2,000 Units by mouth daily.     levothyroxine (SYNTHROID) 50 MCG tablet TAKE 1 TABLET BY MOUTH EVERY DAY BEFORE BREAKFAST 90 tablet 1   metFORMIN (GLUCOPHAGE) 500 MG tablet Take 500 mg by mouth 2 (two) times daily with a meal.     pravastatin (PRAVACHOL) 40 MG tablet Take 40 mg by mouth daily.     sertraline (ZOLOFT) 50 MG tablet Take 75 mg by mouth daily.     No current facility-administered medications for this visit.    SURGICAL HISTORY:  Past Surgical History:  Procedure Laterality Date   NEPHRECTOMY Left     REVIEW OF SYSTEMS:  A comprehensive review of systems was negative except for: Musculoskeletal: positive for arthralgias   PHYSICAL EXAMINATION: General appearance: alert, cooperative, and no distress Head: Normocephalic, without obvious abnormality, atraumatic Neck: no adenopathy, no JVD, supple, symmetrical, trachea midline, and thyroid not enlarged, symmetric, no tenderness/mass/nodules Lymph nodes: Cervical, supraclavicular, and axillary nodes normal. Resp: clear to auscultation bilaterally Back: symmetric, no curvature. ROM normal. No CVA tenderness. Cardio: regular rate and rhythm, S1, S2 normal, no murmur, click, rub or gallop GI: soft, non-tender; bowel sounds normal; no masses,  no organomegaly Extremities: extremities normal, atraumatic, no cyanosis or edema  ECOG PERFORMANCE STATUS: 1 - Symptomatic but completely ambulatory  Blood pressure 130/64, pulse (!) 58, temperature 97.6 F (36.4 C), temperature source Tympanic, resp. rate 15, height 5' 4" (1.626 m), weight   132 lb (59.9 kg), SpO2 100 %.  LABORATORY DATA: Lab Results  Component Value Date   WBC 5.4 11/18/2021   HGB 10.5 (L) 11/18/2021   HCT 34.0 (L) 11/18/2021   MCV 86.3 11/18/2021   PLT 305 11/18/2021      Chemistry      Component Value Date/Time   NA 141 11/18/2021  0957   K 3.8 11/18/2021 0957   CL 108 11/18/2021 0957   CO2 25 11/18/2021 0957   BUN 28 (H) 11/18/2021 0957   CREATININE 1.05 (H) 11/18/2021 0957      Component Value Date/Time   CALCIUM 9.6 11/18/2021 0957   ALKPHOS 68 11/18/2021 0957   AST 16 11/18/2021 0957   ALT 14 11/18/2021 0957   BILITOT 0.3 11/18/2021 0957       RADIOGRAPHIC STUDIES: No results found.   ASSESSMENT AND PLAN: This is a very pleasant 66 years old white female recently diagnosed with a stage IV (T3, N0, M1 a) non-small cell lung cancer, adenocarcinoma presented with large left lower lobe lung mass in addition to another lingular mass and malignant left pleural effusion diagnosed in September 2021.  The molecular studies showed positive PD-L1 expression of 95%.  The patient also has K-ras G12C mutation which will be an option for the second line treatment. With the high PD-L1 expression I discussed with the patient treatment with single agent Libtayo (Cempilimab) 350 mg IV every 3 weeks for up to 2 years unless the patient has evidence for disease progression or unacceptable toxicity. The patient started the first cycle of this treatment on January 15, 2020. She is status post 32 cycles.  For the enlarging right axillary lymph node, the patient underwent palliative radiotherapy to this area. The patient continues to tolerate her treatment with Libtayo (Cempilimab) fairly well with no concerning adverse effects. The patient has been tolerating her treatment with Libtayo (Cempilimab) fairly well. I recommended for her to proceed with cycle #33 today as planned. I will see her back for follow-up visit in 3 weeks for evaluation before the next cycle of her treatment. The patient was advised to call immediately if she has any other concerning symptoms in the interval.  All questions were answered. The patient knows to call the clinic with any problems, questions or concerns. We can certainly see the patient much sooner  if necessary.   Disclaimer: This note was dictated with voice recognition software. Similar sounding words can inadvertently be transcribed and may not be corrected upon review.       

## 2021-11-19 DIAGNOSIS — M25462 Effusion, left knee: Secondary | ICD-10-CM | POA: Insufficient documentation

## 2021-12-04 NOTE — Progress Notes (Signed)
[name] OFFICE PROGRESS NOTE  Gabrielle Dandy, NP [address] [place] [address]  DIAGNOSIS: Stage IV (T3, N0, M1 a) non-small cell lung cancer, adenocarcinoma presented with large left lower lobe lung mass in addition to another lingual mass and malignant left pleural effusion diagnosed in September 2021   Biomarker Findings Tumor Mutational Burden - 13 Muts/Mb Microsatellite status - MS-Stable Genomic Findings For a complete list of the genes assayed, please refer to the Appendix. KRAS G12C PRDM1 D264f*46 TERT promoter -146C>T TP53 V157F 7 Disease relevant genes with no reportable alterations: ALK, BRAF, EGFR, ERBB2, MET, RET, ROS1   PDL1 Expression: 95%  PRIOR THERAPY: Palliative radiotherapy to the enlarging right axillary lymphadenopathy under the care of Dr. MLisbeth Renshaw Completed on 07/11/21  CURRENT THERAPY:  Libtayo (Cempilimab) 350 mg IV every 3 weeks.  First dose January 15, 2020.  Status post 33 cycles.    INTERVAL HISTORY: [name]66y.o. female returns to the clinic today for a follow-up visit accompanied by her husband.  The patient is currently undergoing systemic treatment with immunotherapy with Libtayo. She tolerated her last cycle of treatment fairly well. Her blood sugar is a little more elevated than normal today. She ate a little debbie's cake this morning. She is supposed to be taking metformin but stopped taking this several months ago when she had an episode of hypoglycemia when she was going to undergo a right axillary biopsy. She actually saw her PCP yesterday and they repeated her A1C. She did not share with her PCP that she stopped taking her metformin.   Otherwise, the patient is feeling well today without any concerning complaints.  She denies any fever, chills, night sweats, or unexplained weight loss.  She denies any shortness of breath, significant cough, hemoptysis, or chest pain.  Denies any nausea, vomiting, recent  diarrhea, or constipation.  Denies any rashes or skin changes at this time. She previously had some rashes associated with immunotherapy. Denies any headache or visual changes. She reports the right axillary lymph node that was previously treated with radiation is stable/decreasing in size.  She is here today for evaluation and repeat blood work before undergoing cycle #34   MEDICAL HISTORY: Past Medical History:  Diagnosis Date   Arthritis    Bilateral   Hyperlipidemia    Hypertension    Pleural effusion    Pneumonia    Stage IV adenocarcinoma of lung, left (HCC)     ALLERGIES:  is allergic to ace inhibitors.  MEDICATIONS:  Current Outpatient Medications  Medication Sig Dispense Refill   cholecalciferol (VITAMIN D3) 25 MCG (1000 UNIT) tablet Take 2,000 Units by mouth daily.     levothyroxine (SYNTHROID) 50 MCG tablet TAKE 1 TABLET BY MOUTH EVERY DAY BEFORE BREAKFAST 90 tablet 1   metFORMIN (GLUCOPHAGE) 500 MG tablet Take 500 mg by mouth 2 (two) times daily with a meal.     pravastatin (PRAVACHOL) 40 MG tablet Take 40 mg by mouth daily.     sertraline (ZOLOFT) 50 MG tablet Take 75 mg by mouth daily.     No current facility-administered medications for this visit.    SURGICAL HISTORY:  Past Surgical History:  Procedure Laterality Date   NEPHRECTOMY Left     REVIEW OF SYSTEMS:   Constitutional: Negative for appetite change, chills, fatigue, fever and unexpected weight change.  HENT: Negative for mouth sores, nosebleeds, sore throat and trouble swallowing.   Eyes: Negative for eye problems  and icterus.  Respiratory:  Negative for hemoptysis, cough, shortness of breath, and wheezing.   Cardiovascular: Negative for chest pain. Positive for bilateral lower extremity swelling.  Gastrointestinal: Negative for abdominal pain, constipation, diarrhea, nausea and vomiting.  Genitourinary: Negative for bladder incontinence, difficulty urinating, dysuria, frequency and hematuria.    Musculoskeletal: Negative for back pain, gait problem, neck pain and neck stiffness.  Skin: Negative for itching and rash.  Neurological: Negative for dizziness, extremity weakness, gait problem, headaches, light-headedness and seizures.  Hematological: Negative for adenopathy. Does not bruise/bleed easily.  Psychiatric/Behavioral: Negative for confusion, depression and sleep disturbance. The patient is not nervous/anxious.    PHYSICAL EXAMINATION:  Blood pressure (!) 120/56, pulse (!) 55, temperature 97.6 F (36.4 C), temperature source Oral, resp. rate 18, weight 132 lb 5 oz (60 kg), SpO2 100 %.  ECOG PERFORMANCE STATUS: 1  Physical Exam  Constitutional: Oriented to person, place, and time and well-developed, well-nourished, and in no distress.  HENT:  Head: Normocephalic and atraumatic.  Mouth/Throat: Oropharynx is clear and moist. No oropharyngeal exudate. Positive for nasal congestion.  Eyes: Conjunctivae are normal. Right eye exhibits no discharge. Left eye exhibits no discharge. No scleral icterus.  Neck: Normal range of motion. Neck supple.  Cardiovascular: Bradycardic, regular rhythm, normal heart sounds and intact distal pulses.   Pulmonary/Chest: Effort normal and breath sounds normal. No respiratory distress. No wheezes. No rales.  Abdominal: Soft. Bowel sounds are normal. Exhibits no distension and no mass. There is no tenderness.  Musculoskeletal: Normal range of motion. Mild bilateral lower extremity swelling.  Lymphadenopathy:    No cervical adenopathy.  Neurological: Alert and oriented to person, place, and time. Exhibits normal muscle tone. Gait normal. Coordination normal.  Skin: fungal infection in her thumb of her right hand. Skin is warm and dry. No rash noted. Not diaphoretic. No erythema. No pallor.  Psychiatric: Mood, memory and judgment normal.  Vitals reviewed.  LABORATORY DATA: Lab Results  Component Value Date   WBC 3.7 (L) 12/09/2021   HGB 9.8 (L)  12/09/2021   HCT 31.7 (L) 12/09/2021   MCV 85.7 12/09/2021   PLT 204 12/09/2021      Chemistry      Component Value Date/Time   NA 138 12/09/2021 0855   K 3.6 12/09/2021 0855   CL 109 12/09/2021 0855   CO2 22 12/09/2021 0855   BUN 18 12/09/2021 0855   CREATININE 0.97 12/09/2021 0855      Component Value Date/Time   CALCIUM 8.6 (L) 12/09/2021 0855   ALKPHOS 60 12/09/2021 0855   AST 15 12/09/2021 0855   ALT 12 12/09/2021 0855   BILITOT 0.3 12/09/2021 0855       RADIOGRAPHIC STUDIES:  No results found.   ASSESSMENT/PLAN:  This is a very pleasant 66 year old Caucasian female diagnosed with stage IV (T3, N0, M1A) non-small cell lung cancer, adenocarcinoma.  She presented with a large left lower lobe lung mass in addition to another lingular mass and a malignant left pleural effusion.  She was diagnosed in September 2021.  Her molecular studies show that she has a PD-L1 expression at 95%.  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   The patient is currently undergoing treatment with single agent Libtayo (Cempilimab) 350 mg IV every 3 weeks for up to 2 years unless the patient has evidence for disease progression or unacceptable toxicity.  She is status post 33 cycles and tolerated it  well without any concerning adverse side effects.    The patient underwent radiation to the enlarging right axillary lymph node under the care of Dr. Lisbeth Renshaw. Her last day of radiation was on 07/11/21.    Labs were reviewed. Recommend that she proceed with cycle #34 today scheduled.   We will see her back for follow-up visit in 3 weeks for evaluation and repeat blood work before starting cycle #35.  We will arrange for a restaging CT scan after cycle #35.   Her blood sugar is 244 today. She is asymptomatic. She is supposed to be taking Metformin. She ate a [employer] Cake this morning. Advised her to take her metformin upon returning home and to  avoid foods with a lot of sugar in it and carbs today. She saw her PCP yesterday and had an A1C repeated. I asked the patient to please let her PCP know when she gets her results that she stopped her metformin and if she should resume this based on her A1C.   The patient was advised to call immediately if she has any concerning symptoms in the interval. The patient voices understanding of current disease status and treatment options and is in agreement with the current care plan. All questions were answered. The patient knows to call the clinic with any problems, questions or concerns. We can certainly see the patient much sooner if necessary    No orders of the defined types were placed in this encounter.    The total time spent in the appointment was 20-29 minutes.   Mackie Holness L Madisan Bice, PA-C 12/09/21

## 2021-12-09 ENCOUNTER — Inpatient Hospital Stay: Payer: Medicare Other | Attending: Internal Medicine | Admitting: Physician Assistant

## 2021-12-09 ENCOUNTER — Inpatient Hospital Stay: Payer: Medicare Other

## 2021-12-09 ENCOUNTER — Other Ambulatory Visit: Payer: Self-pay

## 2021-12-09 VITALS — BP 120/56 | HR 55 | Temp 97.6°F | Resp 18 | Wt 132.3 lb

## 2021-12-09 VITALS — BP 114/62 | HR 53 | Temp 98.5°F | Resp 16

## 2021-12-09 DIAGNOSIS — Z923 Personal history of irradiation: Secondary | ICD-10-CM | POA: Insufficient documentation

## 2021-12-09 DIAGNOSIS — C3432 Malignant neoplasm of lower lobe, left bronchus or lung: Secondary | ICD-10-CM | POA: Insufficient documentation

## 2021-12-09 DIAGNOSIS — E785 Hyperlipidemia, unspecified: Secondary | ICD-10-CM | POA: Diagnosis not present

## 2021-12-09 DIAGNOSIS — Z905 Acquired absence of kidney: Secondary | ICD-10-CM | POA: Insufficient documentation

## 2021-12-09 DIAGNOSIS — Z7984 Long term (current) use of oral hypoglycemic drugs: Secondary | ICD-10-CM | POA: Diagnosis not present

## 2021-12-09 DIAGNOSIS — C3492 Malignant neoplasm of unspecified part of left bronchus or lung: Secondary | ICD-10-CM

## 2021-12-09 DIAGNOSIS — I10 Essential (primary) hypertension: Secondary | ICD-10-CM | POA: Insufficient documentation

## 2021-12-09 DIAGNOSIS — J910 Malignant pleural effusion: Secondary | ICD-10-CM | POA: Insufficient documentation

## 2021-12-09 DIAGNOSIS — Z5112 Encounter for antineoplastic immunotherapy: Secondary | ICD-10-CM | POA: Insufficient documentation

## 2021-12-09 DIAGNOSIS — Z79899 Other long term (current) drug therapy: Secondary | ICD-10-CM | POA: Diagnosis not present

## 2021-12-09 DIAGNOSIS — E119 Type 2 diabetes mellitus without complications: Secondary | ICD-10-CM | POA: Diagnosis not present

## 2021-12-09 LAB — CBC WITH DIFFERENTIAL (CANCER CENTER ONLY)
Abs Immature Granulocytes: 0.01 10*3/uL (ref 0.00–0.07)
Basophils Absolute: 0 10*3/uL (ref 0.0–0.1)
Basophils Relative: 1 %
Eosinophils Absolute: 0.1 10*3/uL (ref 0.0–0.5)
Eosinophils Relative: 4 %
HCT: 31.7 % — ABNORMAL LOW (ref 36.0–46.0)
Hemoglobin: 9.8 g/dL — ABNORMAL LOW (ref 12.0–15.0)
Immature Granulocytes: 0 %
Lymphocytes Relative: 15 %
Lymphs Abs: 0.6 10*3/uL — ABNORMAL LOW (ref 0.7–4.0)
MCH: 26.5 pg (ref 26.0–34.0)
MCHC: 30.9 g/dL (ref 30.0–36.0)
MCV: 85.7 fL (ref 80.0–100.0)
Monocytes Absolute: 0.3 10*3/uL (ref 0.1–1.0)
Monocytes Relative: 9 %
Neutro Abs: 2.7 10*3/uL (ref 1.7–7.7)
Neutrophils Relative %: 71 %
Platelet Count: 204 10*3/uL (ref 150–400)
RBC: 3.7 MIL/uL — ABNORMAL LOW (ref 3.87–5.11)
RDW: 16.3 % — ABNORMAL HIGH (ref 11.5–15.5)
WBC Count: 3.7 10*3/uL — ABNORMAL LOW (ref 4.0–10.5)
nRBC: 0 % (ref 0.0–0.2)

## 2021-12-09 LAB — CMP (CANCER CENTER ONLY)
ALT: 12 U/L (ref 0–44)
AST: 15 U/L (ref 15–41)
Albumin: 3.5 g/dL (ref 3.5–5.0)
Alkaline Phosphatase: 60 U/L (ref 38–126)
Anion gap: 7 (ref 5–15)
BUN: 18 mg/dL (ref 8–23)
CO2: 22 mmol/L (ref 22–32)
Calcium: 8.6 mg/dL — ABNORMAL LOW (ref 8.9–10.3)
Chloride: 109 mmol/L (ref 98–111)
Creatinine: 0.97 mg/dL (ref 0.44–1.00)
GFR, Estimated: >60 mL/min (ref >60)
Glucose, Bld: 244 mg/dL — ABNORMAL HIGH (ref 70–99)
Potassium: 3.6 mmol/L (ref 3.5–5.1)
Sodium: 138 mmol/L (ref 135–145)
Total Bilirubin: 0.3 mg/dL (ref 0.3–1.2)
Total Protein: 6 g/dL — ABNORMAL LOW (ref 6.5–8.1)

## 2021-12-09 LAB — TSH: TSH: 3.45 u[IU]/mL (ref 0.350–4.500)

## 2021-12-09 MED ORDER — SODIUM CHLORIDE 0.9 % IV SOLN: Freq: Once | INTRAVENOUS | Status: AC

## 2021-12-09 MED ORDER — SODIUM CHLORIDE 0.9 % IV SOLN
350.0000 mg | Freq: Once | INTRAVENOUS | Status: AC
  Administered 2021-12-09: 350 mg via INTRAVENOUS
  Filled 2021-12-09: qty 7

## 2021-12-09 NOTE — Patient Instructions (Signed)
[facility]  Discharge Instructions: Thank you for choosing [facility] to provide your oncology and hematology care.   If you have a lab appointment with the [facility], please go directly to the [facility] and check in at the registration area.   Wear comfortable clothing and clothing appropriate for easy access to any Portacath or PICC line.   We strive to give you quality time with your provider. You may need to reschedule your appointment if you arrive late (15 or more minutes).  Arriving late affects you and other patients whose appointments are after yours.  Also, if you miss three or more appointments without notifying the office, you may be dismissed from the clinic at the provider's discretion.      For prescription refill requests, have your pharmacy contact our office and allow 72 hours for refills to be completed.    Today you received the following chemotherapy and/or immunotherapy agents: Cemiplimab.       To help prevent nausea and vomiting after your treatment, we encourage you to take your nausea medication as directed.  BELOW ARE SYMPTOMS THAT SHOULD BE REPORTED IMMEDIATELY: *FEVER GREATER THAN 100.4 F (38 C) OR HIGHER *CHILLS OR SWEATING *NAUSEA AND VOMITING THAT IS NOT CONTROLLED WITH YOUR NAUSEA MEDICATION *UNUSUAL SHORTNESS OF BREATH *UNUSUAL BRUISING OR BLEEDING *URINARY PROBLEMS (pain or burning when urinating, or frequent urination) *BOWEL PROBLEMS (unusual diarrhea, constipation, pain near the anus) TENDERNESS IN MOUTH AND THROAT WITH OR WITHOUT PRESENCE OF ULCERS (sore throat, sores in mouth, or a toothache) UNUSUAL RASH, SWELLING OR PAIN  UNUSUAL VAGINAL DISCHARGE OR ITCHING   Items with * indicate a potential emergency and should be followed up as soon as possible or go to the Emergency Department if any problems should occur.  Please show the CHEMOTHERAPY ALERT CARD or IMMUNOTHERAPY ALERT CARD at check-in  to the Emergency Department and triage nurse.  Should you have questions after your visit or need to cancel or reschedule your appointment, please contact [facility]  Dept: [phone]  and follow the prompts.  Office hours are 8:00 a.m. to 4:30 p.m. Monday - Friday. Please note that voicemails left after 4:00 p.m. may not be returned until the following business day.  We are closed weekends and major holidays. You have access to a nurse at all times for urgent questions. Please call the main number to the clinic Dept: [phone] and follow the prompts.   For any non-urgent questions, you may also contact your provider using MyChart. We now offer e-Visits for anyone 49 and older to request care online for non-urgent symptoms. For details visit [email or web address].   Also download the MyChart app! Go to the app store, search "MyChart", open the app, select [facility], and log in with your MyChart username and password.  Masks are optional in the cancer centers. If you would like for your care team to wear a mask while they are taking care of you, please let them know. For doctor visits, patients may have with them one support person who is at least 67 years old. At this time, visitors are not allowed in the infusion area. Cemiplimab Injection What is this medication? CEMIPLIMAB (se MIP li mab) treats skin cancer and lung cancer. It works by blocking a protein that causes cancer cells to grow and multiply. This helps to slow or stop the spread of cancer cells. It is a monoclonal antibody. This medicine may  be used for other purposes; ask your health care provider or pharmacist if you have questions. COMMON BRAND NAME(S): LIBTAYO What should I tell my care team before I take this medication? They need to know if you have any of these conditions: Allogeneic stem cell transplant (uses someone else's stem cells) Autoimmune diseases, such as Crohn's disease,  ulcerative colitis, or lupus Diabetes Nervous system problems, such as myasthenia gravis or Guillain-Barre syndrome Organ transplant Recent or ongoing radiation Thyroid disease An unusual or allergic reaction to cemiplimab, other medications, foods, dyes, or preservatives Pregnant or trying to get pregnant Breast-feeding How should I use this medication? This medication is injected into a vein. It is given by your care team in a hospital or clinic setting. A special MedGuide will be given to you before each treatment. Be sure to read this information carefully each time. Talk to your care team about the use of this medication in children. Special care may be needed. Overdosage: If you think you have taken too much of this medicine contact a poison control center or emergency room at once. NOTE: This medicine is only for you. Do not share this medicine with others. What if I miss a dose? Keep appointments for follow-up doses. It is important not to miss your dose. Call your care team if you are unable to keep an appointment. What may interact with this medication? Interactions have not been studied. This list may not describe all possible interactions. Give your health care provider a list of all the medicines, herbs, non-prescription drugs, or dietary supplements you use. Also tell them if you smoke, drink alcohol, or use illegal drugs. Some items may interact with your medicine. What should I watch for while using this medication? This medication may make you feel generally unwell. This is not uncommon as chemotherapy can affect healthy cells as well as cancer cells. Report any side effects. Continue your course of treatment even though you feel ill until your care team tells you to stop. You may need blood work done while you are taking this medication. This medication may cause serious skin reactions. They can happen in the weeks to months after starting the medication. Contact your care  team right away if you notice fevers or flu-like symptoms with a rash. The rash may be red or purple and then turn into blisters or peeling of the skin. You may also notice a red rash with swelling of the face, lips, or lymph nodes in your neck or under your arms. Tell your care team right away if you have any changes in your vision. This medication may increase blood sugar. The risk may be higher in patients who already have diabetes. Ask your care team what you can do to lower your risk of diabetes while taking this medication. Talk to your care team if you wish to become pregnant or think you might be pregnant. This medication can cause serious birth defects if taken during pregnancy. A negative pregnancy test is required before starting this medication. A reliable form of contraception is recommended while taking this medication and for at least 4 months after stopping it. Do not breast-feed while taking this medication and for at least 4 months after stopping it. What side effects may I notice from receiving this medication? Side effects that you should report to your care team as soon as possible: Allergic reactions--skin rash, itching, hives, swelling of the face, lips, tongue, or throat Change in vision Dry cough, shortness of breath  or trouble breathing Fever, neck pain or stiffness, sensitivity to light, headache, nausea, vomiting, confusion Headache, double vision, blurry vision, sensitivity to light, eye pain Heart muscle inflammation--unusual weakness or fatigue, shortness of breath, chest pain, fast or irregular heartbeat, dizziness, swelling of the ankles, feet, or hands High blood sugar (hyperglycemia)--increased thirst or amount of urine, unusual weakness or fatigue, blurry vision High thyroid levels (hyperthyroidism)--fast or irregular heartbeat, weight loss, excessive sweating or sensitivity to heat, tremors or shaking, anxiety, nervousness, irregular menstrual cycle or  spotting Infusion reactions--chest pain, shortness of breath or trouble breathing, feeling faint or lightheaded Kidney injury (glomerulonephritis)--decrease in the amount of urine, red or dark brown urine, foamy or bubbly urine, swelling of the ankles, hands, or feet Liver injury--right upper belly pain, loss of appetite, nausea, light-colored stool, dark yellow or brown urine, yellowing skin or eyes, unusual weakness or fatigue Low adrenal gland function--nausea, vomiting, loss of appetite, unusual weakness or fatigue, dizziness Low red blood cell level--unusual weakness or fatigue, dizziness, headache, trouble breathing Low thyroid levels (hypothyroidism)--unusual weakness or fatigue, increased sensitivity to cold, constipation, hair loss, dry skin, weight gain, feelings of depression Pain, tingling, or numbness in the hands or feet, muscle weakness, trouble walking, loss of balance or coordination Rash, fever, and swollen lymph nodes Redness, blistering, peeling, or loosening of the skin, including inside the mouth Sudden or severe stomach pain, bloody diarrhea, fever, nausea, vomiting Unusual bruising or bleeding Side effects that usually do not require medical attention (report these to your care team if they continue or are bothersome): Bone pain Diarrhea Muscle pain This list may not describe all possible side effects. Call your doctor for medical advice about side effects. You may report side effects to FDA at 1-800-FDA-1088. Where should I keep my medication? This medication is given in a hospital or clinic and will not be stored at home. NOTE: This sheet is a summary. It may not cover all possible information. If you have questions about this medicine, talk to your doctor, pharmacist, or health care provider.  2023 Elsevier/Gold Standard (2021-02-14 00:00:00)

## 2021-12-11 ENCOUNTER — Other Ambulatory Visit: Payer: Self-pay

## 2021-12-11 LAB — T4: T4, Total: 5.6 ug/dL (ref 4.5–12.0)

## 2021-12-12 ENCOUNTER — Other Ambulatory Visit: Payer: Self-pay

## 2021-12-19 ENCOUNTER — Telehealth: Payer: Self-pay | Admitting: Internal Medicine

## 2021-12-19 NOTE — Telephone Encounter (Signed)
Scheduled per 10/03 los, I called patient multiple times. I was unable to leave a voicemail, calender will be mailed.

## 2021-12-21 IMAGING — DX DG CHEST 2V
2 series · 2 of 2 positions shown · non-contrast
Comparison: 01/04/2020

CLINICAL DATA: History of lung cancer with worsening shortness of
breath. History of malignant pleural effusion.

EXAM:
CHEST - 2 VIEW

[chest pa]
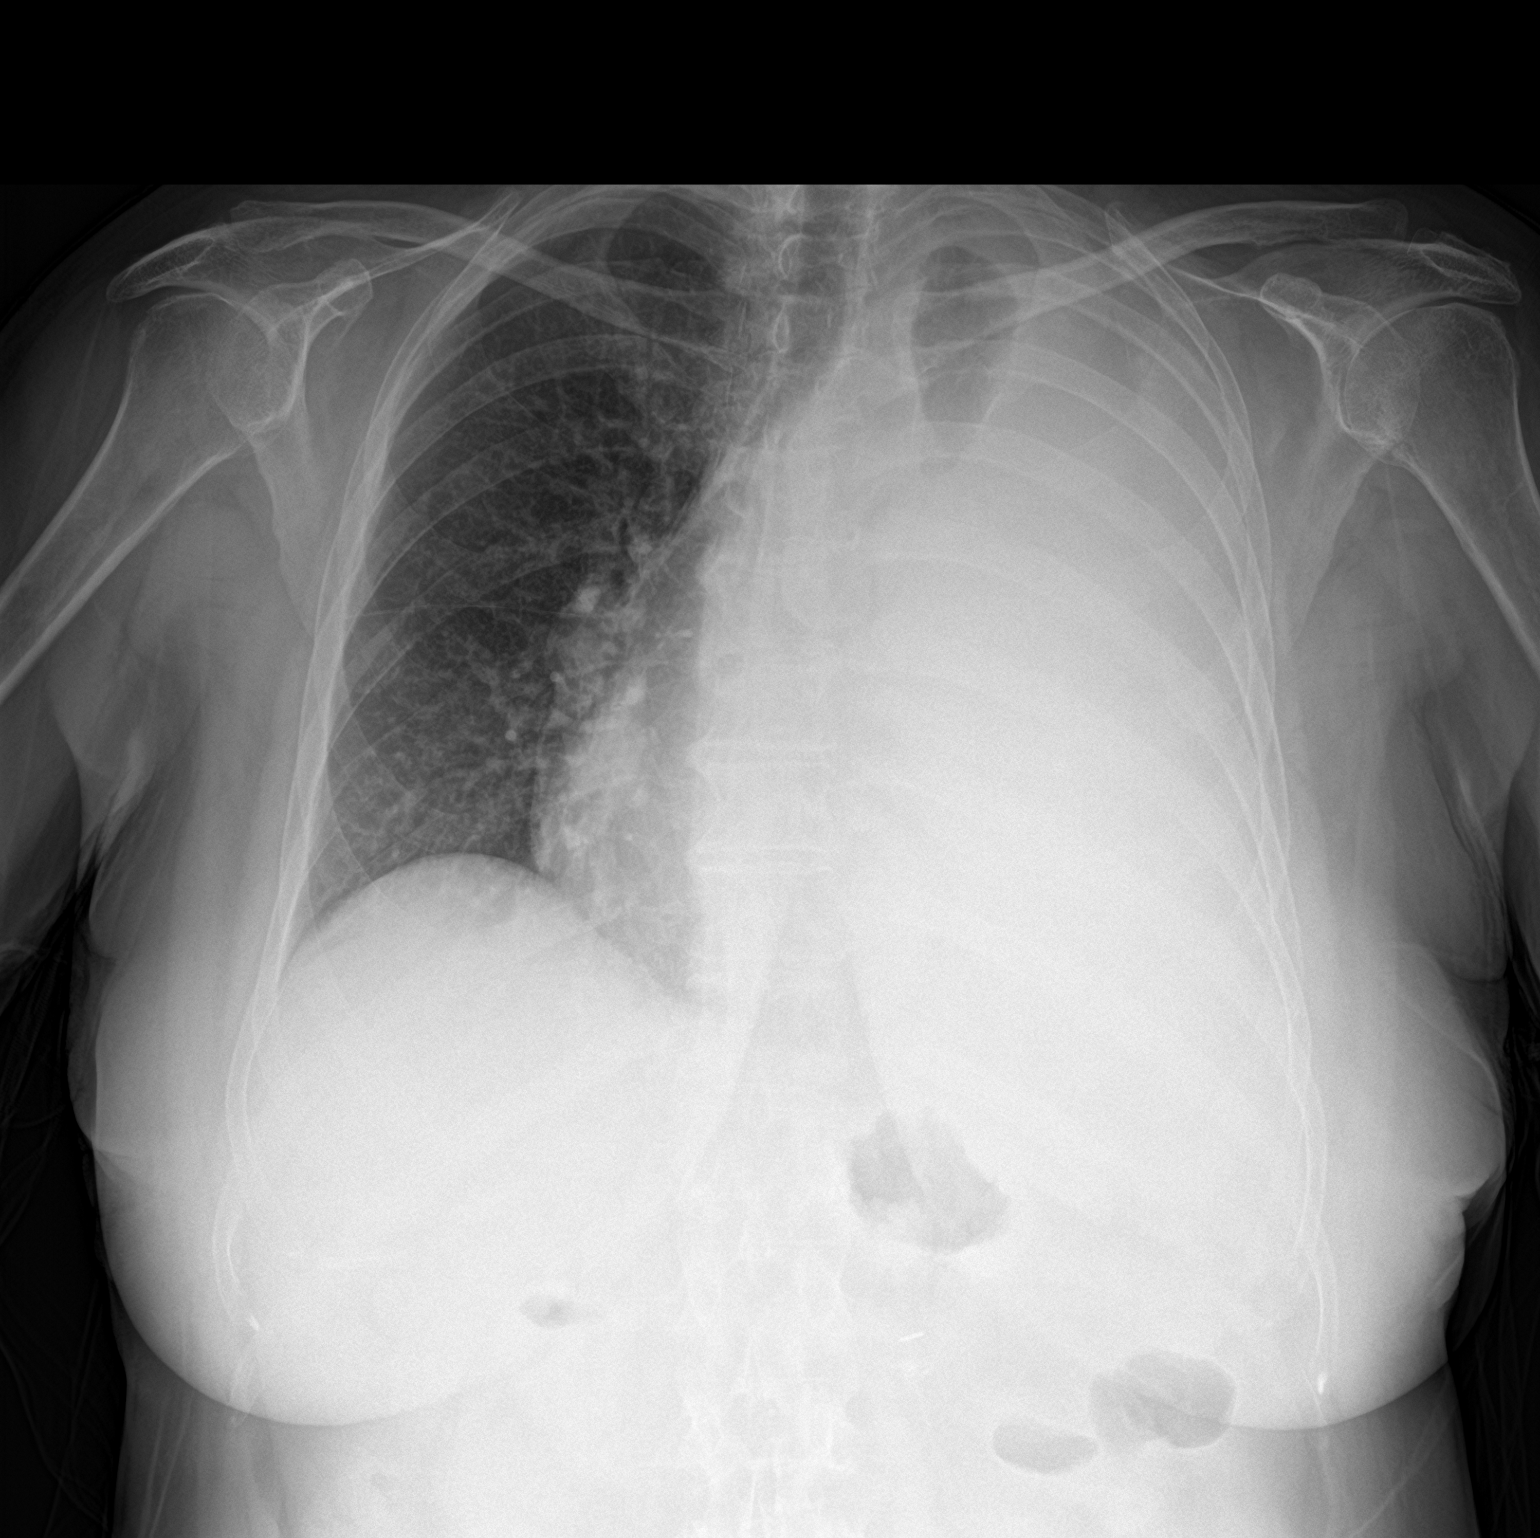

[chest lat]
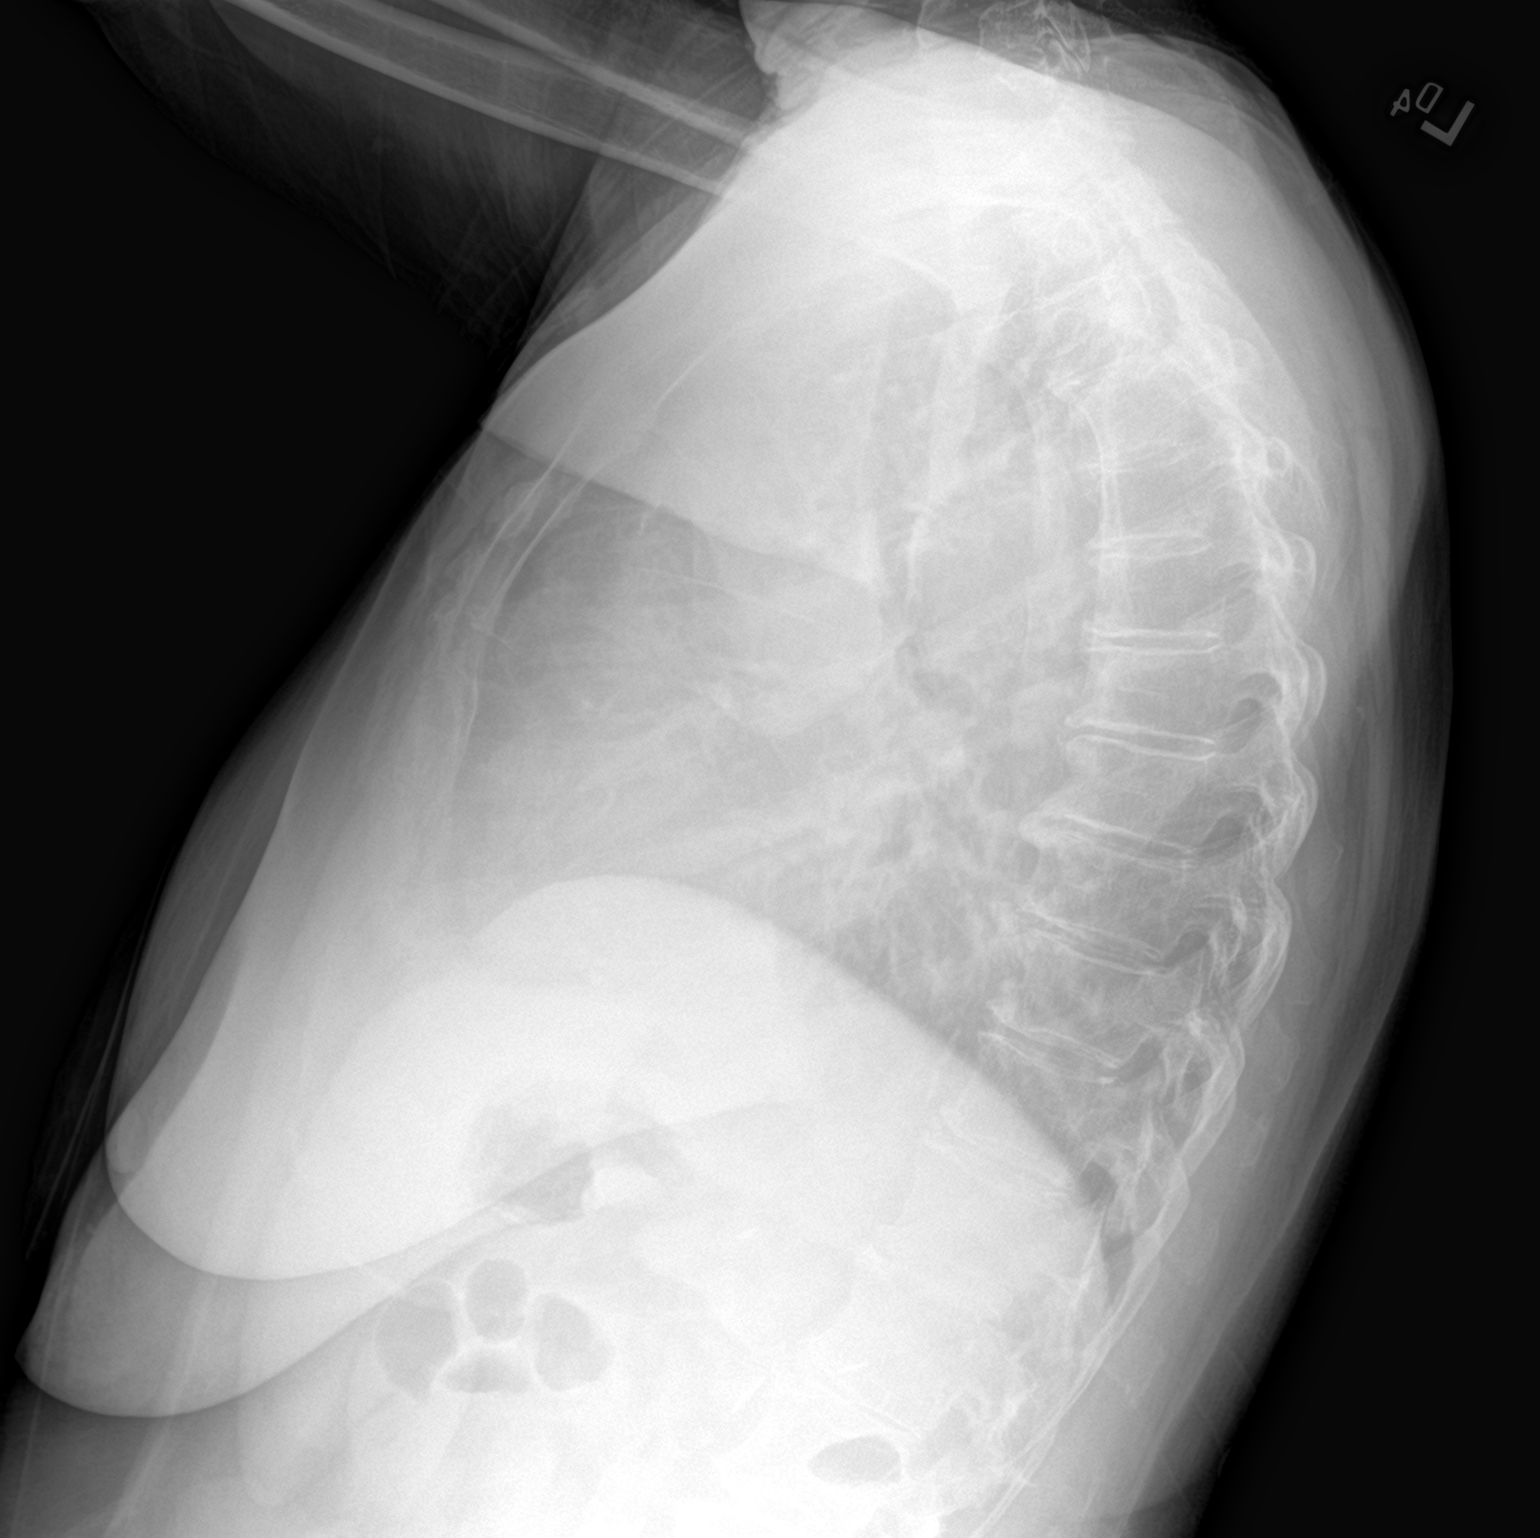

[2 of 2 positions shown; findings below may reference images not displayed]

FINDINGS: Interval worsening of large left pleural effusion with minimal
aeration over the left apex. Likely associated compressive
atelectasis in the left mid to lower lung. Right lung is clear.
Cardiomediastinal silhouette and remainder of the exam is unchanged.
IMPRESSION: Interval worsening of large left pleural effusion likely with
associated compressive atelectasis.

## 2021-12-30 ENCOUNTER — Inpatient Hospital Stay: Payer: Medicare Other

## 2021-12-30 ENCOUNTER — Ambulatory Visit: Payer: Medicare Other | Admitting: Internal Medicine

## 2021-12-30 ENCOUNTER — Encounter: Payer: Self-pay | Admitting: Internal Medicine

## 2021-12-30 ENCOUNTER — Ambulatory Visit: Payer: Medicare Other

## 2021-12-30 ENCOUNTER — Other Ambulatory Visit: Payer: Medicare Other

## 2021-12-30 ENCOUNTER — Inpatient Hospital Stay (HOSPITAL_BASED_OUTPATIENT_CLINIC_OR_DEPARTMENT_OTHER): Payer: Medicare Other | Admitting: Internal Medicine

## 2021-12-30 VITALS — BP 124/68 | HR 57 | Temp 98.2°F | Resp 15 | Wt 133.0 lb

## 2021-12-30 DIAGNOSIS — C3492 Malignant neoplasm of unspecified part of left bronchus or lung: Secondary | ICD-10-CM | POA: Diagnosis not present

## 2021-12-30 DIAGNOSIS — Z5112 Encounter for antineoplastic immunotherapy: Secondary | ICD-10-CM | POA: Diagnosis not present

## 2021-12-30 DIAGNOSIS — C3490 Malignant neoplasm of unspecified part of unspecified bronchus or lung: Secondary | ICD-10-CM | POA: Diagnosis not present

## 2021-12-30 LAB — CBC WITH DIFFERENTIAL (CANCER CENTER ONLY)
Abs Immature Granulocytes: 0.02 10*3/uL (ref 0.00–0.07)
Basophils Absolute: 0.1 10*3/uL (ref 0.0–0.1)
Basophils Relative: 1 %
Eosinophils Absolute: 0.3 10*3/uL (ref 0.0–0.5)
Eosinophils Relative: 5 %
HCT: 33.7 % — ABNORMAL LOW (ref 36.0–46.0)
Hemoglobin: 10.6 g/dL — ABNORMAL LOW (ref 12.0–15.0)
Immature Granulocytes: 0 %
Lymphocytes Relative: 12 %
Lymphs Abs: 0.8 10*3/uL (ref 0.7–4.0)
MCH: 26.8 pg (ref 26.0–34.0)
MCHC: 31.5 g/dL (ref 30.0–36.0)
MCV: 85.3 fL (ref 80.0–100.0)
Monocytes Absolute: 0.4 10*3/uL (ref 0.1–1.0)
Monocytes Relative: 7 %
Neutro Abs: 4.9 10*3/uL (ref 1.7–7.7)
Neutrophils Relative %: 75 %
Platelet Count: 314 10*3/uL (ref 150–400)
RBC: 3.95 MIL/uL (ref 3.87–5.11)
RDW: 16.8 % — ABNORMAL HIGH (ref 11.5–15.5)
WBC Count: 6.6 10*3/uL (ref 4.0–10.5)
nRBC: 0 % (ref 0.0–0.2)

## 2021-12-30 LAB — CMP (CANCER CENTER ONLY)
ALT: 16 U/L (ref 0–44)
AST: 18 U/L (ref 15–41)
Albumin: 3.7 g/dL (ref 3.5–5.0)
Alkaline Phosphatase: 58 U/L (ref 38–126)
Anion gap: 8 (ref 5–15)
BUN: 25 mg/dL — ABNORMAL HIGH (ref 8–23)
CO2: 20 mmol/L — ABNORMAL LOW (ref 22–32)
Calcium: 9.2 mg/dL (ref 8.9–10.3)
Chloride: 110 mmol/L (ref 98–111)
Creatinine: 0.99 mg/dL (ref 0.44–1.00)
GFR, Estimated: >60 mL/min (ref >60)
Glucose, Bld: 156 mg/dL — ABNORMAL HIGH (ref 70–99)
Potassium: 3.6 mmol/L (ref 3.5–5.1)
Sodium: 138 mmol/L (ref 135–145)
Total Bilirubin: 0.4 mg/dL (ref 0.3–1.2)
Total Protein: 6.9 g/dL (ref 6.5–8.1)

## 2021-12-30 LAB — TSH: TSH: 7.101 u[IU]/mL — ABNORMAL HIGH (ref 0.350–4.500)

## 2021-12-30 MED ORDER — SODIUM CHLORIDE 0.9 % IV SOLN
350.0000 mg | Freq: Once | INTRAVENOUS | Status: AC
  Administered 2021-12-30: 350 mg via INTRAVENOUS
  Filled 2021-12-30: qty 7

## 2021-12-30 MED ORDER — SODIUM CHLORIDE 0.9 % IV SOLN: Freq: Once | INTRAVENOUS | Status: AC

## 2021-12-30 NOTE — Progress Notes (Signed)
[facility] Telephone:([phone]) [phone]   Fax:([phone]  OFFICE PROGRESS NOTE  [name], NP [place] [address]  DIAGNOSIS: stage IV (T3, N0, M1 a) non-small cell lung cancer, adenocarcinoma presented with large left lower lobe lung mass in addition to another lingual mass and malignant left pleural effusion diagnosed in September 2021  Biomarker Findings Tumor Mutational Burden - 13 Muts/Mb Microsatellite status - MS-Stable Genomic Findings For a complete list of the genes assayed, please refer to the Appendix. KRAS G12C PRDM1 [id number]*46 TERT promoter -146C>T TP53 V157F 7 Disease relevant genes with no reportable alterations: ALK, BRAF, EGFR, ERBB2, MET, RET, ROS1   PDL1 Expression: 95%  PRIOR THERAPY: Palliative radiotherapy to the enlarging right axillary lymphadenopathy under the care of Dr. MLisbeth Renshaw  CURRENT THERAPY: Libtayo (Cempilimab) 350 mg IV every 3 weeks.  First dose January 15, 2020.  Status post 34 cycles.  INTERVAL HISTORY: Gabrielle SCHINKE66y.o. female returns to the clinic today for follow-up visit accompanied by her husband.  The patient is celebrating her 89birthday today.  She denied having any current chest pain, shortness of breath, cough or hemoptysis.  She has no nausea, vomiting, diarrhea or constipation.  She has no headache or visual changes.  She denied having any recent weight loss or night sweats.  She is here today for evaluation before starting cycle #35 of her treatment with Libtayo (Cempilimab).   MEDICAL HISTORY: Past Medical History:  Diagnosis Date   Arthritis    Bilateral   Hyperlipidemia    Hypertension    Pleural effusion    Pneumonia    Stage IV adenocarcinoma of lung, left (HCC)     ALLERGIES:  is allergic to ace inhibitors.  MEDICATIONS:  Current Outpatient Medications  Medication Sig Dispense Refill   cholecalciferol (VITAMIN D3) 25 MCG (1000 UNIT) tablet Take 2,000 Units by  mouth daily.     levothyroxine (SYNTHROID) 50 MCG tablet TAKE 1 TABLET BY MOUTH EVERY DAY BEFORE BREAKFAST 90 tablet 1   metFORMIN (GLUCOPHAGE) 500 MG tablet Take 500 mg by mouth 2 (two) times daily with a meal.     pravastatin (PRAVACHOL) 40 MG tablet Take 40 mg by mouth daily.     sertraline (ZOLOFT) 50 MG tablet Take 75 mg by mouth daily.     No current facility-administered medications for this visit.    SURGICAL HISTORY:  Past Surgical History:  Procedure Laterality Date   NEPHRECTOMY Left     REVIEW OF SYSTEMS:  A comprehensive review of systems was negative.   PHYSICAL EXAMINATION: General appearance: alert, cooperative, and no distress Head: Normocephalic, without obvious abnormality, atraumatic Neck: no adenopathy, no JVD, supple, symmetrical, trachea midline, and thyroid not enlarged, symmetric, no tenderness/mass/nodules Lymph nodes: Cervical, supraclavicular, and axillary nodes normal. Resp: clear to auscultation bilaterally Back: symmetric, no curvature. ROM normal. No CVA tenderness. Cardio: regular rate and rhythm, S1, S2 normal, no murmur, click, rub or gallop GI: soft, non-tender; bowel sounds normal; no masses,  no organomegaly Extremities: extremities normal, atraumatic, no cyanosis or edema  ECOG PERFORMANCE STATUS: 1 - Symptomatic but completely ambulatory  Blood pressure 124/68, pulse (!) 57, temperature 98.2 F (36.8 C), temperature source Oral, resp. rate 15, weight 133 lb (60.3 kg), SpO2 100 %.  LABORATORY DATA: Lab Results  Component Value Date   WBC 3.7 (L) 12/09/2021   HGB 9.8 (L) 12/09/2021   HCT 31.7 (L) 12/09/2021  MCV 85.7 12/09/2021   PLT 204 12/09/2021      Chemistry      Component Value Date/Time   NA 138 12/09/2021 0855   K 3.6 12/09/2021 0855   CL 109 12/09/2021 0855   CO2 22 12/09/2021 0855   BUN 18 12/09/2021 0855   CREATININE 0.97 12/09/2021 0855      Component Value Date/Time   CALCIUM 8.6 (L) 12/09/2021 0855   ALKPHOS 60  12/09/2021 0855   AST 15 12/09/2021 0855   ALT 12 12/09/2021 0855   BILITOT 0.3 12/09/2021 0855       RADIOGRAPHIC STUDIES: No results found.   ASSESSMENT AND PLAN: This is a very pleasant 66 years old white female recently diagnosed with a stage IV (T3, N0, M1 a) non-small cell lung cancer, adenocarcinoma presented with large left lower lobe lung mass in addition to another lingular mass and malignant left pleural effusion diagnosed in September 2021.  The molecular studies showed positive PD-L1 expression of 95%.  The patient also has K-ras G12C mutation which will be an option for the second line treatment. With the high PD-L1 expression I discussed with the patient treatment with single agent Libtayo (Cempilimab) 350 mg IV every 3 weeks for up to 2 years unless the patient has evidence for disease progression or unacceptable toxicity. The patient started the first cycle of this treatment on January 15, 2020. She is status post 34 cycles.  For the enlarging right axillary lymph node, the patient underwent palliative radiotherapy to this area. The patient has been tolerating her treatment with Libtayo (Cempilimab) fairly well with no concerning adverse effects. I recommended for her to proceed with cycle #35 today as planned. I will see her back for follow-up visit in 3 weeks for evaluation with repeat CT scan of the chest, abdomen and pelvis for restaging of her disease. If the patient has no evidence for disease progression after this cycle, I will monitor her closely with observation after she completed 2 years of treatment with immunotherapy. The patient was advised to call immediately if she has any other concerning symptoms in the interval.  All questions were answered. The patient knows to call the clinic with any problems, questions or concerns. We can certainly see the patient much sooner if necessary.   Disclaimer: This note was dictated with voice recognition software. Similar  sounding words can inadvertently be transcribed and may not be corrected upon review.

## 2021-12-30 NOTE — Patient Instructions (Signed)
[facility]  Discharge Instructions: Thank you for choosing [facility] to provide your oncology and hematology care.   If you have a lab appointment with the [facility], please go directly to the [facility] and check in at the registration area.   Wear comfortable clothing and clothing appropriate for easy access to any Portacath or PICC line.   We strive to give you quality time with your provider. You may need to reschedule your appointment if you arrive late (15 or more minutes).  Arriving late affects you and other patients whose appointments are after yours.  Also, if you miss three or more appointments without notifying the office, you may be dismissed from the clinic at the provider's discretion.      For prescription refill requests, have your pharmacy contact our office and allow 72 hours for refills to be completed.    Today you received the following chemotherapy and/or immunotherapy agents :  Libtayo      To help prevent nausea and vomiting after your treatment, we encourage you to take your nausea medication as directed.  BELOW ARE SYMPTOMS THAT SHOULD BE REPORTED IMMEDIATELY: *FEVER GREATER THAN 100.4 F (38 C) OR HIGHER *CHILLS OR SWEATING *NAUSEA AND VOMITING THAT IS NOT CONTROLLED WITH YOUR NAUSEA MEDICATION *UNUSUAL SHORTNESS OF BREATH *UNUSUAL BRUISING OR BLEEDING *URINARY PROBLEMS (pain or burning when urinating, or frequent urination) *BOWEL PROBLEMS (unusual diarrhea, constipation, pain near the anus) TENDERNESS IN MOUTH AND THROAT WITH OR WITHOUT PRESENCE OF ULCERS (sore throat, sores in mouth, or a toothache) UNUSUAL RASH, SWELLING OR PAIN  UNUSUAL VAGINAL DISCHARGE OR ITCHING   Items with * indicate a potential emergency and should be followed up as soon as possible or go to the Emergency Department if any problems should occur.  Please show the CHEMOTHERAPY ALERT CARD or IMMUNOTHERAPY ALERT CARD at check-in to  the Emergency Department and triage nurse.  Should you have questions after your visit or need to cancel or reschedule your appointment, please contact [facility]  Dept: [phone]  and follow the prompts.  Office hours are 8:00 a.m. to 4:30 p.m. Monday - Friday. Please note that voicemails left after 4:00 p.m. may not be returned until the following business day.  We are closed weekends and major holidays. You have access to a nurse at all times for urgent questions. Please call the main number to the clinic Dept: [phone] and follow the prompts.   For any non-urgent questions, you may also contact your provider using MyChart. We now offer e-Visits for anyone 77 and older to request care online for non-urgent symptoms. For details visit [email or web address].   Also download the MyChart app! Go to the app store, search "MyChart", open the app, select [facility], and log in with your MyChart username and password.  Masks are optional in the cancer centers. If you would like for your care team to wear a mask while they are taking care of you, please let them know. You may have one support person who is at least 67 years old accompany you for your appointments.

## 2021-12-30 NOTE — Addendum Note (Signed)
Addended by: Ardeen Garland on: 12/30/2021 09:14 AM   Modules accepted: Orders

## 2021-12-31 LAB — T4: T4, Total: 5.2 ug/dL (ref 4.5–12.0)

## 2022-01-15 ENCOUNTER — Ambulatory Visit (HOSPITAL_COMMUNITY)
Admission: RE | Admit: 2022-01-15 | Discharge: 2022-01-15 | Disposition: A | Discharge status: Home / Self Care | Payer: Medicare Other | Source: Ambulatory Visit | Attending: Internal Medicine | Admitting: Internal Medicine

## 2022-01-15 DIAGNOSIS — C3490 Malignant neoplasm of unspecified part of unspecified bronchus or lung: Secondary | ICD-10-CM | POA: Insufficient documentation

## 2022-01-15 MED ORDER — IOHEXOL 300 MG/ML  SOLN
100.0000 mL | Freq: Once | INTRAMUSCULAR | Status: AC | PRN
  Administered 2022-01-15: 100 mL via INTRAVENOUS

## 2022-01-20 ENCOUNTER — Inpatient Hospital Stay: Payer: Medicare Other | Attending: Internal Medicine

## 2022-01-20 ENCOUNTER — Inpatient Hospital Stay (HOSPITAL_BASED_OUTPATIENT_CLINIC_OR_DEPARTMENT_OTHER): Payer: Medicare Other | Admitting: Internal Medicine

## 2022-01-20 ENCOUNTER — Other Ambulatory Visit: Payer: Self-pay

## 2022-01-20 VITALS — BP 126/60 | HR 55 | Temp 97.5°F | Resp 16

## 2022-01-20 DIAGNOSIS — C3432 Malignant neoplasm of lower lobe, left bronchus or lung: Secondary | ICD-10-CM | POA: Insufficient documentation

## 2022-01-20 DIAGNOSIS — M25561 Pain in right knee: Secondary | ICD-10-CM | POA: Insufficient documentation

## 2022-01-20 DIAGNOSIS — Z905 Acquired absence of kidney: Secondary | ICD-10-CM | POA: Diagnosis not present

## 2022-01-20 DIAGNOSIS — C3490 Malignant neoplasm of unspecified part of unspecified bronchus or lung: Secondary | ICD-10-CM

## 2022-01-20 DIAGNOSIS — R5383 Other fatigue: Secondary | ICD-10-CM

## 2022-01-20 DIAGNOSIS — C3492 Malignant neoplasm of unspecified part of left bronchus or lung: Secondary | ICD-10-CM

## 2022-01-20 DIAGNOSIS — Z79899 Other long term (current) drug therapy: Secondary | ICD-10-CM | POA: Insufficient documentation

## 2022-01-20 LAB — CMP (CANCER CENTER ONLY)
ALT: 15 U/L (ref 0–44)
AST: 17 U/L (ref 15–41)
Albumin: 4 g/dL (ref 3.5–5.0)
Alkaline Phosphatase: 72 U/L (ref 38–126)
Anion gap: 9 (ref 5–15)
BUN: 36 mg/dL — ABNORMAL HIGH (ref 8–23)
CO2: 21 mmol/L — ABNORMAL LOW (ref 22–32)
Calcium: 9.5 mg/dL (ref 8.9–10.3)
Chloride: 109 mmol/L (ref 98–111)
Creatinine: 1.08 mg/dL — ABNORMAL HIGH (ref 0.44–1.00)
GFR, Estimated: 56 mL/min — ABNORMAL LOW (ref >60)
Glucose, Bld: 144 mg/dL — ABNORMAL HIGH (ref 70–99)
Potassium: 3.6 mmol/L (ref 3.5–5.1)
Sodium: 139 mmol/L (ref 135–145)
Total Bilirubin: 0.2 mg/dL — ABNORMAL LOW (ref 0.3–1.2)
Total Protein: 7.5 g/dL (ref 6.5–8.1)

## 2022-01-20 LAB — CBC WITH DIFFERENTIAL (CANCER CENTER ONLY)
Abs Immature Granulocytes: 0.02 10*3/uL (ref 0.00–0.07)
Basophils Absolute: 0.1 10*3/uL (ref 0.0–0.1)
Basophils Relative: 1 %
Eosinophils Absolute: 0.2 10*3/uL (ref 0.0–0.5)
Eosinophils Relative: 3 %
HCT: 34.2 % — ABNORMAL LOW (ref 36.0–46.0)
Hemoglobin: 10.5 g/dL — ABNORMAL LOW (ref 12.0–15.0)
Immature Granulocytes: 0 %
Lymphocytes Relative: 17 %
Lymphs Abs: 0.9 10*3/uL (ref 0.7–4.0)
MCH: 26.4 pg (ref 26.0–34.0)
MCHC: 30.7 g/dL (ref 30.0–36.0)
MCV: 85.9 fL (ref 80.0–100.0)
Monocytes Absolute: 0.3 10*3/uL (ref 0.1–1.0)
Monocytes Relative: 5 %
Neutro Abs: 3.8 10*3/uL (ref 1.7–7.7)
Neutrophils Relative %: 74 %
Platelet Count: 311 10*3/uL (ref 150–400)
RBC: 3.98 MIL/uL (ref 3.87–5.11)
RDW: 15.2 % (ref 11.5–15.5)
WBC Count: 5.2 10*3/uL (ref 4.0–10.5)
nRBC: 0 % (ref 0.0–0.2)

## 2022-01-20 LAB — TSH: TSH: 3.25 u[IU]/mL (ref 0.350–4.500)

## 2022-01-20 NOTE — Progress Notes (Signed)
[facility] [phone]   Fax:(336) [phone]  OFFICE PROGRESS NOTE  [name], NP [place] [address]  DIAGNOSIS: stage IV (T3, N0, M1 a) non-small cell lung cancer, adenocarcinoma presented with large left lower lobe lung mass in addition to another lingual mass and malignant left pleural effusion diagnosed in September 2021  Biomarker Findings Tumor Mutational Burden - 13 Muts/Mb Microsatellite status - MS-Stable Genomic Findings For a complete list of the genes assayed, please refer to the Appendix. KRAS G12C PRDM1 D2108f*46 TERT promoter -146C>T TP53 V157F 7 Disease relevant genes with no reportable alterations: ALK, BRAF, EGFR, ERBB2, MET, RET, ROS1   PDL1 Expression: 95%  PRIOR THERAPY:  1) Palliative radiotherapy to the enlarging right axillary lymphadenopathy under the care of Dr. MLisbeth Renshaw 2) Libtayo (Cempilimab) 350 mg IV every 3 weeks.  First dose January 15, 2020.  Status post 35 cycles.  CURRENT THERAPY: Observation.  INTERVAL HISTORY: Gabrielle BERT89y.o. female returns to the clinic today for follow-up visit accompanied by her husband.  The patient is feeling fine today with no concerning complaints except for arthralgia especially in the right knee.  She was seen by orthopedic surgeon and there was some concern about right knee effusion but they would not be able to drain much.  She denied having any current chest pain, shortness of breath, cough or hemoptysis.  She denied having any fever or chills.  She has no nausea, vomiting, diarrhea or constipation.  She has no headache or visual changes.  She denied having any recent weight loss or night sweats.  She has been tolerating her treatment with Libtayo (Cempilimab) fairly well.  She is here today for evaluation with repeat CT scan of the chest, abdomen and pelvis for restaging of her disease.  MEDICAL HISTORY: Past Medical History:  Diagnosis Date    Arthritis    Bilateral   Hyperlipidemia    Hypertension    Pleural effusion    Pneumonia    Stage IV adenocarcinoma of lung, left (HCC)     ALLERGIES:  is allergic to ace inhibitors.  MEDICATIONS:  Current Outpatient Medications  Medication Sig Dispense Refill   cholecalciferol (VITAMIN D3) 25 MCG (1000 UNIT) tablet Take 2,000 Units by mouth daily.     levothyroxine (SYNTHROID) 50 MCG tablet TAKE 1 TABLET BY MOUTH EVERY DAY BEFORE BREAKFAST 90 tablet 1   pravastatin (PRAVACHOL) 40 MG tablet Take 40 mg by mouth daily.     sertraline (ZOLOFT) 50 MG tablet Take 75 mg by mouth daily.     No current facility-administered medications for this visit.    SURGICAL HISTORY:  Past Surgical History:  Procedure Laterality Date   NEPHRECTOMY Left     REVIEW OF SYSTEMS:  Constitutional: positive for fatigue Eyes: negative Ears, nose, mouth, throat, and face: negative Respiratory: negative Cardiovascular: negative Gastrointestinal: negative Genitourinary:negative Integument/breast: negative Hematologic/lymphatic: negative Musculoskeletal:positive for arthralgias Neurological: negative Behavioral/Psych: negative Endocrine: negative Allergic/Immunologic: negative   PHYSICAL EXAMINATION: General appearance: alert, cooperative, and no distress Head: Normocephalic, without obvious abnormality, atraumatic Neck: no adenopathy, no JVD, supple, symmetrical, trachea midline, and thyroid not enlarged, symmetric, no tenderness/mass/nodules Lymph nodes: Cervical, supraclavicular, and axillary nodes normal. Resp: clear to auscultation bilaterally Back: symmetric, no curvature. ROM normal. No CVA tenderness. Cardio: regular rate and rhythm, S1, S2 normal, no murmur, click, rub or gallop GI: soft, non-tender; bowel sounds normal; no masses,  no organomegaly Extremities: extremities  normal, atraumatic, no cyanosis or edema Neurologic: Alert and oriented X 3, normal strength and tone. Normal  symmetric reflexes. Normal coordination and gait  ECOG PERFORMANCE STATUS: 1 - Symptomatic but completely ambulatory  Blood pressure 126/60, pulse (!) 55, temperature (!) 97.5 F (36.4 C), temperature source Oral, resp. rate 16, SpO2 99 %.  LABORATORY DATA: Lab Results  Component Value Date   WBC 5.2 01/20/2022   HGB 10.5 (L) 01/20/2022   HCT 34.2 (L) 01/20/2022   MCV 85.9 01/20/2022   PLT 311 01/20/2022      Chemistry      Component Value Date/Time   NA 138 12/30/2021 0815   K 3.6 12/30/2021 0815   CL 110 12/30/2021 0815   CO2 20 (L) 12/30/2021 0815   BUN 25 (H) 12/30/2021 0815   CREATININE 0.99 12/30/2021 0815      Component Value Date/Time   CALCIUM 9.2 12/30/2021 0815   ALKPHOS 58 12/30/2021 0815   AST 18 12/30/2021 0815   ALT 16 12/30/2021 0815   BILITOT 0.4 12/30/2021 0815       RADIOGRAPHIC STUDIES: CT Abdomen Pelvis W Contrast  Result Date: 01/19/2022 CLINICAL DATA:  Non-small cell lung cancer, staging. * Tracking Code: BO * EXAM: CT ABDOMEN AND PELVIS WITH CONTRAST TECHNIQUE: Multidetector CT imaging of the abdomen and pelvis was performed using the standard protocol following bolus administration of intravenous contrast. RADIATION DOSE REDUCTION: This exam was performed according to the departmental dose-optimization program which includes automated exposure control, adjustment of the mA and/or kV according to patient size and/or use of iterative reconstruction technique. CONTRAST:  162m OMNIPAQUE IOHEXOL 300 MG/ML  [facility] COMPARISON:  10/03/2021. 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 Heart is enlarged. Atherosclerotic calcification of the aorta and coronary arteries. No pericardial effusion. Distal esophagus is grossly unremarkable. Hepatobiliary: 12 mm low-attenuation lesion in the inferior right hepatic lobe, too small to characterize but unchanged. Liver and gallbladder are otherwise unremarkable. No  biliary ductal dilatation. Pancreas: Negative. Spleen: Negative. Adrenals/Urinary Tract: Adrenal glands and right kidney are unremarkable. Right ureter is decompressed. Left kidney is absent. Bladder is grossly unremarkable. Stomach/Bowel: Stomach, small bowel and colon are unremarkable. Appendix is not readily visualized. Vascular/Lymphatic: Atherosclerotic calcification of the aorta. Prominent perirectal vascularity. Left periaortic lymph nodes measure up to 9 mm (2/31), stable. Reproductive: Uterus is visualized.  No adnexal mass. Other: Small bilateral inguinal hernias contain fat. No free fluid. Mesenteries and peritoneum are unremarkable. Musculoskeletal: Degenerative changes in the spine. No worrisome lytic or sclerotic lesions. IMPRESSION: 1. No evidence of metastatic disease. 2. Left oophorectomy with stable small left periaortic lymph nodes. 3. 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 4. Aortic atherosclerosis (ICD10-I70.0). Coronary artery calcification. Electronically Signed   By: MLorin PicketM.D.   On: 01/19/2022 09:09     ASSESSMENT AND PLAN: This is a very pleasant 66years old white female recently diagnosed with a stage IV (T3, N0, M1 a) non-small cell lung cancer, adenocarcinoma presented with large left lower lobe lung mass in addition to another lingular mass and malignant left pleural effusion diagnosed in September 2021.  The molecular studies showed positive PD-L1 expression of 95%.  The patient also has K-ras G12C mutation which will be an option for the second line treatment. With the high PD-L1 expression I discussed with the patient treatment with single agent Libtayo (Cempilimab) 350 mg IV every 3 weeks for up to 2 years unless the  patient has evidence for disease progression or unacceptable toxicity. The patient started the first cycle of this treatment on January 15, 2020. She is status post 35 cycles.  She completed 2 years of treatment  with immunotherapy. For the enlarging right axillary lymph node, the patient underwent palliative radiotherapy to this area. The patient had repeat CT scan of the chest, abdomen and pelvis performed recently.  I personally and independently reviewed the scan images and discussed the results with the patient and her husband today. Her scan showed no concerning findings for disease progression. I recommended for her to continue on observation with repeat CT scan of the chest, abdomen and pelvis in 3 months. For the arthralgia, she will follow-up with her primary care physician as well as orthopedic surgeon.  I do not see any concern or contraindication to give her steroid treatment if needed. The patient was advised to call immediately if she has any other concerning symptoms in the interval.  All questions were answered. The patient knows to call the clinic with any problems, questions or concerns. We can certainly see the patient much sooner if necessary.   Disclaimer: This note was dictated with voice recognition software. Similar sounding words can inadvertently be transcribed and may not be corrected upon review.

## 2022-01-21 ENCOUNTER — Other Ambulatory Visit: Payer: Self-pay

## 2022-01-22 LAB — T4: T4, Total: 6.7 ug/dL (ref 4.5–12.0)

## 2022-01-23 DIAGNOSIS — M25561 Pain in right knee: Secondary | ICD-10-CM | POA: Insufficient documentation

## 2022-02-04 ENCOUNTER — Other Ambulatory Visit: Payer: Self-pay

## 2022-02-07 DIAGNOSIS — M1711 Unilateral primary osteoarthritis, right knee: Secondary | ICD-10-CM | POA: Insufficient documentation

## 2022-04-16 ENCOUNTER — Encounter: Payer: Self-pay | Admitting: Internal Medicine

## 2022-04-17 ENCOUNTER — Ambulatory Visit (HOSPITAL_COMMUNITY)
Admission: RE | Admit: 2022-04-17 | Discharge: 2022-04-17 | Disposition: A | Discharge status: Home / Self Care | Payer: Medicare Other | Source: Ambulatory Visit | Attending: Internal Medicine | Admitting: Internal Medicine

## 2022-04-17 DIAGNOSIS — C3490 Malignant neoplasm of unspecified part of unspecified bronchus or lung: Secondary | ICD-10-CM | POA: Diagnosis not present

## 2022-04-17 LAB — POCT I-STAT CREATININE: Creatinine, Ser: 1.1 mg/dL — ABNORMAL HIGH (ref 0.44–1.00)

## 2022-04-17 MED ORDER — SODIUM CHLORIDE (PF) 0.9 % IJ SOLN
INTRAMUSCULAR | Status: AC
  Filled 2022-04-17: qty 50

## 2022-04-17 MED ORDER — IOHEXOL 300 MG/ML  SOLN
75.0000 mL | Freq: Once | INTRAMUSCULAR | Status: AC | PRN
  Administered 2022-04-17: 75 mL via INTRAVENOUS

## 2022-04-21 ENCOUNTER — Inpatient Hospital Stay: Payer: Medicare Other | Attending: Internal Medicine

## 2022-04-21 ENCOUNTER — Inpatient Hospital Stay (HOSPITAL_BASED_OUTPATIENT_CLINIC_OR_DEPARTMENT_OTHER): Payer: Medicare Other | Admitting: Internal Medicine

## 2022-04-21 VITALS — BP 137/62 | HR 60 | Temp 98.2°F | Resp 14 | Wt 136.2 lb

## 2022-04-21 DIAGNOSIS — C3432 Malignant neoplasm of lower lobe, left bronchus or lung: Secondary | ICD-10-CM | POA: Diagnosis present

## 2022-04-21 DIAGNOSIS — D649 Anemia, unspecified: Secondary | ICD-10-CM | POA: Insufficient documentation

## 2022-04-21 DIAGNOSIS — C3490 Malignant neoplasm of unspecified part of unspecified bronchus or lung: Secondary | ICD-10-CM | POA: Diagnosis not present

## 2022-04-21 DIAGNOSIS — Z79899 Other long term (current) drug therapy: Secondary | ICD-10-CM | POA: Diagnosis not present

## 2022-04-21 DIAGNOSIS — C3492 Malignant neoplasm of unspecified part of left bronchus or lung: Secondary | ICD-10-CM

## 2022-04-21 DIAGNOSIS — R5383 Other fatigue: Secondary | ICD-10-CM

## 2022-04-21 LAB — CBC WITH DIFFERENTIAL (CANCER CENTER ONLY)
Abs Immature Granulocytes: 0.03 10*3/uL (ref 0.00–0.07)
Basophils Absolute: 0.1 10*3/uL (ref 0.0–0.1)
Basophils Relative: 2 %
Eosinophils Absolute: 0.2 10*3/uL (ref 0.0–0.5)
Eosinophils Relative: 3 %
HCT: 30.3 % — ABNORMAL LOW (ref 36.0–46.0)
Hemoglobin: 9.2 g/dL — ABNORMAL LOW (ref 12.0–15.0)
Immature Granulocytes: 1 %
Lymphocytes Relative: 16 %
Lymphs Abs: 0.8 10*3/uL (ref 0.7–4.0)
MCH: 24.9 pg — ABNORMAL LOW (ref 26.0–34.0)
MCHC: 30.4 g/dL (ref 30.0–36.0)
MCV: 82.1 fL (ref 80.0–100.0)
Monocytes Absolute: 0.3 10*3/uL (ref 0.1–1.0)
Monocytes Relative: 5 %
Neutro Abs: 4 10*3/uL (ref 1.7–7.7)
Neutrophils Relative %: 73 %
Platelet Count: 338 10*3/uL (ref 150–400)
RBC: 3.69 MIL/uL — ABNORMAL LOW (ref 3.87–5.11)
RDW: 15.3 % (ref 11.5–15.5)
WBC Count: 5.4 10*3/uL (ref 4.0–10.5)
nRBC: 0 % (ref 0.0–0.2)

## 2022-04-21 LAB — CMP (CANCER CENTER ONLY)
ALT: 12 U/L (ref 0–44)
AST: 13 U/L — ABNORMAL LOW (ref 15–41)
Albumin: 3.7 g/dL (ref 3.5–5.0)
Alkaline Phosphatase: 74 U/L (ref 38–126)
Anion gap: 9 (ref 5–15)
BUN: 34 mg/dL — ABNORMAL HIGH (ref 8–23)
CO2: 22 mmol/L (ref 22–32)
Calcium: 9.4 mg/dL (ref 8.9–10.3)
Chloride: 108 mmol/L (ref 98–111)
Creatinine: 1.12 mg/dL — ABNORMAL HIGH (ref 0.44–1.00)
GFR, Estimated: 54 mL/min — ABNORMAL LOW (ref >60)
Glucose, Bld: 140 mg/dL — ABNORMAL HIGH (ref 70–99)
Potassium: 4.5 mmol/L (ref 3.5–5.1)
Sodium: 139 mmol/L (ref 135–145)
Total Bilirubin: 0.2 mg/dL — ABNORMAL LOW (ref 0.3–1.2)
Total Protein: 7.2 g/dL (ref 6.5–8.1)

## 2022-04-21 LAB — TSH: TSH: 7.775 u[IU]/mL — ABNORMAL HIGH (ref 0.350–4.500)

## 2022-04-21 NOTE — Progress Notes (Signed)
[place] [phone]   [phone]  OFFICE PROGRESS NOTE  [name], NP [place] [address]  DIAGNOSIS: stage IV (T3, N0, M1 a) non-small cell lung cancer, adenocarcinoma presented with large left lower lobe lung mass in addition to another lingual mass and malignant left pleural effusion diagnosed in September 2021  Biomarker Findings Tumor Mutational Burden - 13 Muts/Mb Microsatellite status - MS-Stable Genomic Findings For a complete list of the genes assayed, please refer to the Appendix. KRAS G12C PRDM1 D261fs*46 TERT promoter -146C>T TP53 V157F 7 Disease relevant genes with no reportable alterations: ALK, BRAF, EGFR, ERBB2, MET, RET, ROS1   PDL1 Expression: 95%  PRIOR THERAPY:  1) Palliative radiotherapy to the enlarging right axillary lymphadenopathy under the care of Dr. Lisbeth Renshaw. 2) Libtayo (Cempilimab) 350 mg IV every 3 weeks.  First dose January 15, 2020.  Status post 35 cycles.  CURRENT THERAPY: Observation.  [name] 67 y.o. female returns to the clinic today for follow-up visit accompanied by her husband.  The patient is feeling fine today with no concerning complaints except for mild fatigue and shortness of breath with exertion.  She denied having any current chest pain, shortness of breath, cough or hemoptysis.  She has no nausea, vomiting, diarrhea or constipation.  She has no headache or visual changes.  She denied having any significant weight loss or night sweats.  She is here today for evaluation with repeat CT scan of the chest for restaging of her disease.  MEDICAL HISTORY: Past Medical History:  Diagnosis Date   Arthritis    Bilateral   Hyperlipidemia    Hypertension    Pleural effusion    Pneumonia    Stage IV adenocarcinoma of lung, left (HCC)     ALLERGIES:  is allergic to ace inhibitors.  MEDICATIONS:  Current Outpatient Medications  Medication  Sig Dispense Refill   cholecalciferol (VITAMIN D3) 25 MCG (1000 UNIT) tablet Take 2,000 Units by mouth daily.     levothyroxine (SYNTHROID) 50 MCG tablet TAKE 1 TABLET BY MOUTH EVERY DAY BEFORE BREAKFAST 90 tablet 1   pravastatin (PRAVACHOL) 40 MG tablet Take 40 mg by mouth daily.     sertraline (ZOLOFT) 50 MG tablet Take 75 mg by mouth daily.     No current facility-administered medications for this visit.    SURGICAL HISTORY:  Past Surgical History:  Procedure Laterality Date   NEPHRECTOMY Left     REVIEW OF SYSTEMS:  Constitutional: positive for fatigue Eyes: negative Ears, nose, mouth, throat, and face: negative Respiratory: positive for dyspnea on exertion Cardiovascular: negative Gastrointestinal: negative Genitourinary:negative Integument/breast: negative Hematologic/lymphatic: negative Musculoskeletal:positive for arthralgias Neurological: negative Behavioral/Psych: negative Endocrine: negative Allergic/Immunologic: negative   PHYSICAL EXAMINATION: General appearance: alert, cooperative, and no distress Head: Normocephalic, without obvious abnormality, atraumatic Neck: no adenopathy, no JVD, supple, symmetrical, trachea midline, and thyroid not enlarged, symmetric, no tenderness/mass/nodules Lymph nodes: Cervical, supraclavicular, and axillary nodes normal. Resp: clear to auscultation bilaterally Back: symmetric, no curvature. ROM normal. No CVA tenderness. Cardio: regular rate and rhythm, S1, S2 normal, no murmur, click, rub or gallop GI: soft, non-tender; bowel sounds normal; no masses,  no organomegaly Extremities: extremities normal, atraumatic, no cyanosis or edema Neurologic: Alert and oriented X 3, normal strength and tone. Normal symmetric reflexes. Normal coordination and gait  ECOG PERFORMANCE STATUS: 1 - Symptomatic but completely ambulatory  Blood pressure 137/62, pulse 60, temperature 98.2 F (  36.8 C), temperature source Oral, resp. rate 14, weight 136  lb 3.2 oz (61.8 kg), SpO2 100 %.  LABORATORY DATA: Lab Results  Component Value Date   WBC 5.4 04/21/2022   HGB 9.2 (L) 04/21/2022   HCT 30.3 (L) 04/21/2022   MCV 82.1 04/21/2022   PLT 338 04/21/2022      Chemistry      Component Value Date/Time   NA 139 01/20/2022 0911   K 3.6 01/20/2022 0911   CL 109 01/20/2022 0911   CO2 21 (L) 01/20/2022 0911   BUN 36 (H) 01/20/2022 0911   CREATININE 1.10 (H) 04/17/2022 1430   CREATININE 1.08 (H) 01/20/2022 0911      Component Value Date/Time   CALCIUM 9.5 01/20/2022 0911   ALKPHOS 72 01/20/2022 0911   AST 17 01/20/2022 0911   ALT 15 01/20/2022 0911   BILITOT 0.2 (L) 01/20/2022 0911       RADIOGRAPHIC STUDIES: CT Abdomen Pelvis W Contrast  Result Date: 04/17/2022 CLINICAL DATA:  Non-small-cell lung cancer. Restaging. * Tracking Code: BO * EXAM: CT CHEST, ABDOMEN, AND PELVIS WITH CONTRAST TECHNIQUE: Multidetector CT imaging of the chest, abdomen and pelvis was performed following the standard protocol during bolus administration of intravenous contrast. RADIATION DOSE REDUCTION: This exam was performed according to the departmental dose-optimization program which includes automated exposure control, adjustment of the mA and/or kV according to patient size and/or use of iterative reconstruction technique. CONTRAST:  61mL OMNIPAQUE IOHEXOL 300 MG/ML  SOLN COMPARISON:  Abdomen pelvis CT 01/15/2022. Chest abdomen pelvis CT 10/03/2021. FINDINGS: CT CHEST FINDINGS Cardiovascular: The heart size is normal. No substantial pericardial effusion. Coronary artery calcification is evident. Mild atherosclerotic calcification is noted in the wall of the thoracic aorta. Mediastinum/Nodes: 11 mm subcarinal lymph node on 24/2 is minimally more prominent than previously at 9 mm. Otherwise no mediastinal lymphadenopathy. There is no hilar lymphadenopathy. Tiny hiatal hernia. The esophagus has normal imaging features. There is no axillary lymphadenopathy. 7 mm short  axis right axillary node measured on the previous exam is now 4 mm short axis on image 19/2. Index 12 mm lymph node described previously in the pre pericardial space is 11 mm today on image 32/2. Lungs/Pleura: Volume loss left hemithorax is stable in the interval. Pleuroparenchymal scarring anterior left lung is unchanged, compatible with radiation fibrosis. The pleuroparenchymal scarring in the posterior left lower lobe is also unchanged. 2 mm right upper lobe nodule on 44/5 is stable. The peripheral right lower lobe ground-glass nodularity seen on the previous exam has resolved completely in the interval compatible with infectious/inflammatory etiology. Tiny chronic left effusion is stable. 6 mm peripheral left upper lung nodule on image 41/5 was 4 mm previously. Musculoskeletal: No worrisome lytic or sclerotic osseous abnormality. CT ABDOMEN PELVIS FINDINGS Hepatobiliary: No suspicious focal abnormality within the liver parenchyma. There is no evidence for gallstones, gallbladder wall thickening, or pericholecystic fluid. No intrahepatic or extrahepatic biliary dilation. Pancreas: Pancreas is diffusely atrophic without main duct dilatation. Spleen: No splenomegaly. No focal mass lesion. Adrenals/Urinary Tract: No adrenal nodule or mass. Left kidney surgically absent. Stable appearance of the surgical bed. Right kidney and ureter unremarkable. The urinary bladder appears normal for the degree of distention. Stomach/Bowel: Tiny hiatal hernia. Stomach otherwise unremarkable. Duodenum is normally positioned as is the ligament of Treitz. No small bowel wall thickening. No small bowel dilatation. Mobile cecum is positioned in the anterior midline abdomen. The terminal ileum is normal. The appendix is normal. No gross colonic mass. No  colonic wall thickening. Diverticular changes are noted in the left colon without evidence of diverticulitis. Vascular/Lymphatic: There is moderate atherosclerotic calcification of the  abdominal aorta without aneurysm. The upper normal left para rectal lymph nodes identified previously are stable with 7 mm index node on 71/2 again noted. No pelvic sidewall lymphadenopathy. Index 7 mm right external iliac node on 106/2 is unchanged. Reproductive: The uterus is unremarkable.  There is no adnexal mass. Other: No intraperitoneal free fluid. Musculoskeletal: No worrisome lytic or sclerotic osseous abnormality. IMPRESSION: 1. Interval resolution of the peripheral right lower lobe ground-glass nodularity seen on the previous exam compatible with infectious/inflammatory etiology. 2. Slight interval increase in size of a 6 mm peripheral left upper lung nodule. Close attention on follow-up recommended. 3. Otherwise stable exam. No new or progressive findings in the chest, abdomen, or pelvis. 4. Status post left nephrectomy. 5. Tiny hiatal hernia. 6. Left colonic diverticulosis without diverticulitis. 7.  Aortic Atherosclerosis (ICD10-I70.0). Electronically Signed   By: Misty Stanley M.D.   On: 04/17/2022 16:26   CT Chest W Contrast  Result Date: 04/17/2022 CLINICAL DATA:  Non-small-cell lung cancer. Restaging. * Tracking Code: BO * EXAM: CT CHEST, ABDOMEN, AND PELVIS WITH CONTRAST TECHNIQUE: Multidetector CT imaging of the chest, abdomen and pelvis was performed following the standard protocol during bolus administration of intravenous contrast. RADIATION DOSE REDUCTION: This exam was performed according to the departmental dose-optimization program which includes automated exposure control, adjustment of the mA and/or kV according to patient size and/or use of iterative reconstruction technique. CONTRAST:  22mL OMNIPAQUE IOHEXOL 300 MG/ML  SOLN COMPARISON:  Abdomen pelvis CT 01/15/2022. Chest abdomen pelvis CT 10/03/2021. FINDINGS: CT CHEST FINDINGS Cardiovascular: The heart size is normal. No substantial pericardial effusion. Coronary artery calcification is evident. Mild atherosclerotic calcification  is noted in the wall of the thoracic aorta. Mediastinum/Nodes: 11 mm subcarinal lymph node on 24/2 is minimally more prominent than previously at 9 mm. Otherwise no mediastinal lymphadenopathy. There is no hilar lymphadenopathy. Tiny hiatal hernia. The esophagus has normal imaging features. There is no axillary lymphadenopathy. 7 mm short axis right axillary node measured on the previous exam is now 4 mm short axis on image 19/2. Index 12 mm lymph node described previously in the pre pericardial space is 11 mm today on image 32/2. Lungs/Pleura: Volume loss left hemithorax is stable in the interval. Pleuroparenchymal scarring anterior left lung is unchanged, compatible with radiation fibrosis. The pleuroparenchymal scarring in the posterior left lower lobe is also unchanged. 2 mm right upper lobe nodule on 44/5 is stable. The peripheral right lower lobe ground-glass nodularity seen on the previous exam has resolved completely in the interval compatible with infectious/inflammatory etiology. Tiny chronic left effusion is stable. 6 mm peripheral left upper lung nodule on image 41/5 was 4 mm previously. Musculoskeletal: No worrisome lytic or sclerotic osseous abnormality. CT ABDOMEN PELVIS FINDINGS Hepatobiliary: No suspicious focal abnormality within the liver parenchyma. There is no evidence for gallstones, gallbladder wall thickening, or pericholecystic fluid. No intrahepatic or extrahepatic biliary dilation. Pancreas: Pancreas is diffusely atrophic without main duct dilatation. Spleen: No splenomegaly. No focal mass lesion. Adrenals/Urinary Tract: No adrenal nodule or mass. Left kidney surgically absent. Stable appearance of the surgical bed. Right kidney and ureter unremarkable. The urinary bladder appears normal for the degree of distention. Stomach/Bowel: Tiny hiatal hernia. Stomach otherwise unremarkable. Duodenum is normally positioned as is the ligament of Treitz. No small bowel wall thickening. No small bowel  dilatation. Mobile cecum is positioned in  the anterior midline abdomen. The terminal ileum is normal. The appendix is normal. No gross colonic mass. No colonic wall thickening. Diverticular changes are noted in the left colon without evidence of diverticulitis. Vascular/Lymphatic: There is moderate atherosclerotic calcification of the abdominal aorta without aneurysm. The upper normal left para rectal lymph nodes identified previously are stable with 7 mm index node on 71/2 again noted. No pelvic sidewall lymphadenopathy. Index 7 mm right external iliac node on 106/2 is unchanged. Reproductive: The uterus is unremarkable.  There is no adnexal mass. Other: No intraperitoneal free fluid. Musculoskeletal: No worrisome lytic or sclerotic osseous abnormality. IMPRESSION: 1. Interval resolution of the peripheral right lower lobe ground-glass nodularity seen on the previous exam compatible with infectious/inflammatory etiology. 2. Slight interval increase in size of a 6 mm peripheral left upper lung nodule. Close attention on follow-up recommended. 3. Otherwise stable exam. No new or progressive findings in the chest, abdomen, or pelvis. 4. Status post left nephrectomy. 5. Tiny hiatal hernia. 6. Left colonic diverticulosis without diverticulitis. 7.  Aortic Atherosclerosis (ICD10-I70.0). Electronically Signed   By: Misty Stanley M.D.   On: 04/17/2022 16:26     ASSESSMENT AND PLAN: This is a very pleasant 67 years old white female recently diagnosed with stage IV (T3, N0, M1 a) non-small cell lung cancer, adenocarcinoma presented with large left lower lobe lung mass in addition to another lingular mass and malignant left pleural effusion diagnosed in September 2021.  The molecular studies showed positive PD-L1 expression of 95%.  The patient also has K-ras G12C mutation which will be an option for the second line treatment. With the high PD-L1 expression I discussed with the patient treatment with single agent Libtayo  (Cempilimab) 350 mg IV every 3 weeks for up to 2 years unless the patient has evidence for disease progression or unacceptable toxicity. The patient started the first cycle of this treatment on January 15, 2020. She is status post 35 cycles.  She completed 2 years of treatment with immunotherapy. For the enlarging right axillary lymph node, the patient underwent palliative radiotherapy to this area. The patient is currently on observation and has been doing fine with no concerning complaints except for arthralgia. She had repeat CT scan of the chest, abdomen and pelvis performed recently.  I personally and independently reviewed the scans and discussed the results with the patient and her husband. Her scan showed no concerning findings for disease recurrence or progression except for a slight increase of a 6 mm peripheral left upper lobe nodule that need close monitoring. I recommended for her to continue on observation with repeat CT scan of the chest, abdomen pelvis in 3 months. For the anemia, I encouraged the patient to increase her iron rich diet and also to start taking oral iron tablet once daily with vitamin C or orange juice. The patient was advised to call immediately if she has any other concerning symptoms in the interval.  All questions were answered. The patient knows to call the clinic with any problems, questions or concerns. We can certainly see the patient much sooner if necessary.   Disclaimer: This note was dictated with voice recognition software. Similar sounding words can inadvertently be transcribed and may not be corrected upon review.

## 2022-04-22 ENCOUNTER — Other Ambulatory Visit: Payer: Self-pay

## 2022-07-15 ENCOUNTER — Telehealth: Payer: Self-pay | Admitting: Internal Medicine

## 2022-07-15 NOTE — Telephone Encounter (Signed)
Called patient regarding May appointments, patient is notified.  

## 2022-07-16 ENCOUNTER — Inpatient Hospital Stay: Payer: Medicare Other | Attending: Internal Medicine

## 2022-07-16 ENCOUNTER — Ambulatory Visit (HOSPITAL_COMMUNITY)
Admission: RE | Admit: 2022-07-16 | Discharge: 2022-07-16 | Disposition: A | Discharge status: Home / Self Care | Payer: Medicare Other | Source: Ambulatory Visit | Attending: Internal Medicine | Admitting: Internal Medicine

## 2022-07-16 DIAGNOSIS — D649 Anemia, unspecified: Secondary | ICD-10-CM | POA: Insufficient documentation

## 2022-07-16 DIAGNOSIS — Z905 Acquired absence of kidney: Secondary | ICD-10-CM | POA: Insufficient documentation

## 2022-07-16 DIAGNOSIS — C3490 Malignant neoplasm of unspecified part of unspecified bronchus or lung: Secondary | ICD-10-CM

## 2022-07-16 DIAGNOSIS — Z79899 Other long term (current) drug therapy: Secondary | ICD-10-CM | POA: Insufficient documentation

## 2022-07-16 DIAGNOSIS — C3432 Malignant neoplasm of lower lobe, left bronchus or lung: Secondary | ICD-10-CM | POA: Insufficient documentation

## 2022-07-16 LAB — CBC WITH DIFFERENTIAL (CANCER CENTER ONLY)
Abs Immature Granulocytes: 0.01 10*3/uL (ref 0.00–0.07)
Basophils Absolute: 0.1 10*3/uL (ref 0.0–0.1)
Basophils Relative: 2 %
Eosinophils Absolute: 0.2 10*3/uL (ref 0.0–0.5)
Eosinophils Relative: 5 %
HCT: 35.5 % — ABNORMAL LOW (ref 36.0–46.0)
Hemoglobin: 10.7 g/dL — ABNORMAL LOW (ref 12.0–15.0)
Immature Granulocytes: 0 %
Lymphocytes Relative: 20 %
Lymphs Abs: 0.9 10*3/uL (ref 0.7–4.0)
MCH: 25.6 pg — ABNORMAL LOW (ref 26.0–34.0)
MCHC: 30.1 g/dL (ref 30.0–36.0)
MCV: 84.9 fL (ref 80.0–100.0)
Monocytes Absolute: 0.3 10*3/uL (ref 0.1–1.0)
Monocytes Relative: 6 %
Neutro Abs: 3.2 10*3/uL (ref 1.7–7.7)
Neutrophils Relative %: 67 %
Platelet Count: 297 10*3/uL (ref 150–400)
RBC: 4.18 MIL/uL (ref 3.87–5.11)
RDW: 16.6 % — ABNORMAL HIGH (ref 11.5–15.5)
WBC Count: 4.8 10*3/uL (ref 4.0–10.5)
nRBC: 0 % (ref 0.0–0.2)

## 2022-07-16 LAB — CMP (CANCER CENTER ONLY)
ALT: 16 U/L (ref 0–44)
AST: 20 U/L (ref 15–41)
Albumin: 3.9 g/dL (ref 3.5–5.0)
Alkaline Phosphatase: 78 U/L (ref 38–126)
Anion gap: 6 (ref 5–15)
BUN: 31 mg/dL — ABNORMAL HIGH (ref 8–23)
CO2: 22 mmol/L (ref 22–32)
Calcium: 9.3 mg/dL (ref 8.9–10.3)
Chloride: 111 mmol/L (ref 98–111)
Creatinine: 1.01 mg/dL — ABNORMAL HIGH (ref 0.44–1.00)
GFR, Estimated: >60 mL/min (ref >60)
Glucose, Bld: 109 mg/dL — ABNORMAL HIGH (ref 70–99)
Potassium: 4.8 mmol/L (ref 3.5–5.1)
Sodium: 139 mmol/L (ref 135–145)
Total Bilirubin: 0.3 mg/dL (ref 0.3–1.2)
Total Protein: 7.7 g/dL (ref 6.5–8.1)

## 2022-07-16 MED ORDER — IOHEXOL 300 MG/ML  SOLN
100.0000 mL | Freq: Once | INTRAMUSCULAR | Status: AC | PRN
  Administered 2022-07-16: 100 mL via INTRAVENOUS

## 2022-07-20 LAB — EXTERNAL GENERIC LAB PROCEDURE: COLOGUARD: NEGATIVE

## 2022-07-20 LAB — COLOGUARD: COLOGUARD: NEGATIVE

## 2022-07-21 ENCOUNTER — Inpatient Hospital Stay (HOSPITAL_BASED_OUTPATIENT_CLINIC_OR_DEPARTMENT_OTHER): Payer: Medicare Other | Admitting: Internal Medicine

## 2022-07-21 VITALS — BP 135/73 | HR 52 | Temp 97.3°F | Resp 15 | Wt 141.6 lb

## 2022-07-21 DIAGNOSIS — D649 Anemia, unspecified: Secondary | ICD-10-CM | POA: Diagnosis not present

## 2022-07-21 DIAGNOSIS — Z79899 Other long term (current) drug therapy: Secondary | ICD-10-CM | POA: Diagnosis not present

## 2022-07-21 DIAGNOSIS — R5383 Other fatigue: Secondary | ICD-10-CM

## 2022-07-21 DIAGNOSIS — C3490 Malignant neoplasm of unspecified part of unspecified bronchus or lung: Secondary | ICD-10-CM | POA: Diagnosis not present

## 2022-07-21 DIAGNOSIS — C3432 Malignant neoplasm of lower lobe, left bronchus or lung: Secondary | ICD-10-CM | POA: Diagnosis present

## 2022-07-21 DIAGNOSIS — Z905 Acquired absence of kidney: Secondary | ICD-10-CM | POA: Diagnosis not present

## 2022-07-21 NOTE — Progress Notes (Signed)
[facility] [phone]   [phone]  OFFICE PROGRESS NOTE  [name], NP [address] [place] [address]  DIAGNOSIS: stage IV (T3, N0, M1 a) non-small cell lung cancer, adenocarcinoma presented with large left lower lobe lung mass in addition to another lingual mass and malignant left pleural effusion diagnosed in September 2021  Biomarker Findings Tumor Mutational Burden - 13 Muts/Mb Microsatellite status - MS-Stable Genomic Findings For a complete list of the genes assayed, please refer to the Appendix. KRAS G12C PRDM1 D215fs*46 TERT promoter -146C>T TP53 V157F 7 Disease relevant genes with no reportable alterations: ALK, BRAF, EGFR, ERBB2, MET, RET, ROS1   PDL1 Expression: 95%  PRIOR THERAPY:  1) Palliative radiotherapy to the enlarging right axillary lymphadenopathy under the care of Dr. Mitzi Hansen. 2) Libtayo (Cempilimab) 350 mg IV every 3 weeks.  First dose January 15, 2020.  Status post 35 cycles.  CURRENT THERAPY: Observation.  [name] 67 y.o. female returns to the clinic today for follow-up visit accompanied by her husband.  The patient has no complaints today.  She has no chest pain, shortness of breath except with exertion with no cough or hemoptysis.  She has no nausea, vomiting, diarrhea or constipation.  She has no recent weight loss or night sweats.  She has no headache or visual changes.  She is here today for evaluation with repeat CT scan of the chest, abdomen and pelvis for restaging of her disease.  MEDICAL HISTORY: Past Medical History:  Diagnosis Date   Arthritis    Bilateral   Hyperlipidemia    Hypertension    Pleural effusion    Pneumonia    Stage IV adenocarcinoma of lung, left (HCC)     ALLERGIES:  is allergic to ace inhibitors.  MEDICATIONS:  Current Outpatient Medications  Medication Sig Dispense Refill   cholecalciferol (VITAMIN D3) 25 MCG (1000 UNIT) tablet  Take 2,000 Units by mouth daily.     levothyroxine (SYNTHROID) 50 MCG tablet TAKE 1 TABLET BY MOUTH EVERY DAY BEFORE BREAKFAST 90 tablet 1   sertraline (ZOLOFT) 50 MG tablet Take 75 mg by mouth daily.     No current facility-administered medications for this visit.    SURGICAL HISTORY:  Past Surgical History:  Procedure Laterality Date   NEPHRECTOMY Left     REVIEW OF SYSTEMS:  Constitutional: negative Eyes: negative Ears, nose, mouth, throat, and face: negative Respiratory: positive for dyspnea on exertion Cardiovascular: negative Gastrointestinal: negative Genitourinary:negative Integument/breast: negative Hematologic/lymphatic: negative Musculoskeletal:negative Neurological: negative Behavioral/Psych: negative Endocrine: negative Allergic/Immunologic: negative   PHYSICAL EXAMINATION: General appearance: alert, cooperative, and no distress Head: Normocephalic, without obvious abnormality, atraumatic Neck: no adenopathy, no JVD, supple, symmetrical, trachea midline, and thyroid not enlarged, symmetric, no tenderness/mass/nodules Lymph nodes: Cervical, supraclavicular, and axillary nodes normal. Resp: clear to auscultation bilaterally Back: symmetric, no curvature. ROM normal. No CVA tenderness. Cardio: regular rate and rhythm, S1, S2 normal, no murmur, click, rub or gallop GI: soft, non-tender; bowel sounds normal; no masses,  no organomegaly Extremities: extremities normal, atraumatic, no cyanosis or edema Neurologic: Alert and oriented X 3, normal strength and tone. Normal symmetric reflexes. Normal coordination and gait  ECOG PERFORMANCE STATUS: 1 - Symptomatic but completely ambulatory  Blood pressure 135/73, pulse (!) 52, temperature (!) 97.3 F (36.3 C), temperature source Oral, resp. rate 15, weight 141 lb 9.6 oz (64.2 kg), SpO2 100 %.  LABORATORY DATA: Lab Results  Component Value  Date   WBC 4.8 07/16/2022   HGB 10.7 (L) 07/16/2022   HCT 35.5 (L) 07/16/2022    MCV 84.9 07/16/2022   PLT 297 07/16/2022      Chemistry      Component Value Date/Time   NA 139 07/16/2022 0954   K 4.8 07/16/2022 0954   CL 111 07/16/2022 0954   CO2 22 07/16/2022 0954   BUN 31 (H) 07/16/2022 0954   CREATININE 1.01 (H) 07/16/2022 0954      Component Value Date/Time   CALCIUM 9.3 07/16/2022 0954   ALKPHOS 78 07/16/2022 0954   AST 20 07/16/2022 0954   ALT 16 07/16/2022 0954   BILITOT 0.3 07/16/2022 0954       RADIOGRAPHIC STUDIES: CT Chest W Contrast  Result Date: 07/20/2022 CLINICAL DATA:  Stage IV non-small cell left lung cancer with history of immunotherapy and palliative radiotherapy to right axillary lymphadenopathy. Interval observation. * Tracking Code: BO * EXAM: CT CHEST, ABDOMEN, AND PELVIS WITH CONTRAST TECHNIQUE: Multidetector CT imaging of the chest, abdomen and pelvis was performed following the standard protocol during bolus administration of intravenous contrast. RADIATION DOSE REDUCTION: This exam was performed according to the departmental dose-optimization program which includes automated exposure control, adjustment of the mA and/or kV according to patient size and/or use of iterative reconstruction technique. CONTRAST:  OMNIPAQUE IOHEXOL 300 MG/ML  SOLN COMPARISON:  04/17/2022 CT chest, abdomen and pelvis. FINDINGS: CT CHEST FINDINGS Cardiovascular: Normal heart size. No significant pericardial effusion/thickening. Three-vessel coronary atherosclerosis. Atherosclerotic nonaneurysmal thoracic aorta. Normal caliber pulmonary arteries. No central pulmonary emboli. Mediastinum/Nodes: No significant thyroid nodules. Unremarkable esophagus. No pathologically enlarged axillary nodes. Enlarged 1.2 cm right anterior pericardial node (series 2/image 31), previously 1.1 cm, stable to minimally increased. Enlarged 1.3 cm subcarinal node (series 2/image 24), previously 1.1 cm, minimally increased. No additional pathologically enlarged mediastinal nodes. No  hilar adenopathy. Lungs/Pleura: No pneumothorax. No pleural effusion. Solid 0.8 cm peripheral left upper lobe pulmonary nodule (series 4/image 40), mildly increased from 0.6 cm. Several thick curvilinear parenchymal bands throughout the left upper and left lower lobes with associated significant volume loss and distortion, unchanged. No acute consolidative airspace disease or new significant pulmonary nodules. Musculoskeletal: No aggressive appearing focal osseous lesions. Moderate thoracic spondylosis. CT ABDOMEN PELVIS FINDINGS Hepatobiliary: Normal liver size. Subcentimeter hypodense segment 6 right liver lesion (series 2/image 53), too small to characterize, unchanged. No new liver lesions. Normal gallbladder with no radiopaque cholelithiasis. No biliary ductal dilatation. Pancreas: Normal, with no mass or duct dilation. Spleen: Normal size. No mass. Adrenals/Urinary Tract: Normal adrenals. Stable postsurgical changes from left nephrectomy. No right hydronephrosis. No right renal masses. Normal bladder. Stomach/Bowel: Small hiatal hernia. Otherwise normal nondistended stomach. Normal caliber small bowel with no small bowel wall thickening. Normal appendix. Mild left colonic diverticulosis with no large bowel wall thickening or significant pericolonic fat stranding. Mild-to-moderate colonic stool. Vascular/Lymphatic: Atherosclerotic nonaneurysmal abdominal aorta. Patent portal, splenic, hepatic and right renal veins. Mildly enlarged 1.0 cm right external iliac node (series 2/image 106), minimally increased from 0.9 cm using similar measurement technique. No additional pathologically enlarged lymph nodes in the abdomen or pelvis. Reproductive: Grossly normal uterus.  No adnexal mass. Other: No pneumoperitoneum, ascites or focal fluid collection. Musculoskeletal: No aggressive appearing focal osseous lesions. Moderate lumbar degenerative disc disease. IMPRESSION: 1. Mild mediastinal and right external iliac  lymphadenopathy is minimally increased, suspicious for metastatic adenopathy. 2. Mild growth of solid 0.8 cm peripheral left upper lobe pulmonary nodule suspicious for metastasis. 3.  No additional potential sites of metastatic disease in the chest, abdomen or pelvis. 4. Chronic findings include: Stable postsurgical changes from left nephrectomy. Three-vessel coronary atherosclerosis. Small hiatal hernia. Mild left colonic diverticulosis. Aortic Atherosclerosis (ICD10-I70.0). Electronically Signed   By: Delbert Phenix M.D.   On: 07/20/2022 11:26   CT Abdomen Pelvis W Contrast  Result Date: 07/20/2022 CLINICAL DATA:  Stage IV non-small cell left lung cancer with history of immunotherapy and palliative radiotherapy to right axillary lymphadenopathy. Interval observation. * Tracking Code: BO * EXAM: CT CHEST, ABDOMEN, AND PELVIS WITH CONTRAST TECHNIQUE: Multidetector CT imaging of the chest, abdomen and pelvis was performed following the standard protocol during bolus administration of intravenous contrast. RADIATION DOSE REDUCTION: This exam was performed according to the departmental dose-optimization program which includes automated exposure control, adjustment of the mA and/or kV according to patient size and/or use of iterative reconstruction technique. CONTRAST:  OMNIPAQUE IOHEXOL 300 MG/ML  SOLN COMPARISON:  04/17/2022 CT chest, abdomen and pelvis. FINDINGS: CT CHEST FINDINGS Cardiovascular: Normal heart size. No significant pericardial effusion/thickening. Three-vessel coronary atherosclerosis. Atherosclerotic nonaneurysmal thoracic aorta. Normal caliber pulmonary arteries. No central pulmonary emboli. Mediastinum/Nodes: No significant thyroid nodules. Unremarkable esophagus. No pathologically enlarged axillary nodes. Enlarged 1.2 cm right anterior pericardial node (series 2/image 31), previously 1.1 cm, stable to minimally increased. Enlarged 1.3 cm subcarinal node (series 2/image 24), previously 1.1  cm, minimally increased. No additional pathologically enlarged mediastinal nodes. No hilar adenopathy. Lungs/Pleura: No pneumothorax. No pleural effusion. Solid 0.8 cm peripheral left upper lobe pulmonary nodule (series 4/image 40), mildly increased from 0.6 cm. Several thick curvilinear parenchymal bands throughout the left upper and left lower lobes with associated significant volume loss and distortion, unchanged. No acute consolidative airspace disease or new significant pulmonary nodules. Musculoskeletal: No aggressive appearing focal osseous lesions. Moderate thoracic spondylosis. CT ABDOMEN PELVIS FINDINGS Hepatobiliary: Normal liver size. Subcentimeter hypodense segment 6 right liver lesion (series 2/image 53), too small to characterize, unchanged. No new liver lesions. Normal gallbladder with no radiopaque cholelithiasis. No biliary ductal dilatation. Pancreas: Normal, with no mass or duct dilation. Spleen: Normal size. No mass. Adrenals/Urinary Tract: Normal adrenals. Stable postsurgical changes from left nephrectomy. No right hydronephrosis. No right renal masses. Normal bladder. Stomach/Bowel: Small hiatal hernia. Otherwise normal nondistended stomach. Normal caliber small bowel with no small bowel wall thickening. Normal appendix. Mild left colonic diverticulosis with no large bowel wall thickening or significant pericolonic fat stranding. Mild-to-moderate colonic stool. Vascular/Lymphatic: Atherosclerotic nonaneurysmal abdominal aorta. Patent portal, splenic, hepatic and right renal veins. Mildly enlarged 1.0 cm right external iliac node (series 2/image 106), minimally increased from 0.9 cm using similar measurement technique. No additional pathologically enlarged lymph nodes in the abdomen or pelvis. Reproductive: Grossly normal uterus.  No adnexal mass. Other: No pneumoperitoneum, ascites or focal fluid collection. Musculoskeletal: No aggressive appearing focal osseous lesions. Moderate lumbar  degenerative disc disease. IMPRESSION: 1. Mild mediastinal and right external iliac lymphadenopathy is minimally increased, suspicious for metastatic adenopathy. 2. Mild growth of solid 0.8 cm peripheral left upper lobe pulmonary nodule suspicious for metastasis. 3. No additional potential sites of metastatic disease in the chest, abdomen or pelvis. 4. Chronic findings include: Stable postsurgical changes from left nephrectomy. Three-vessel coronary atherosclerosis. Small hiatal hernia. Mild left colonic diverticulosis. Aortic Atherosclerosis (ICD10-I70.0). Electronically Signed   By: Delbert Phenix M.D.   On: 07/20/2022 11:26     ASSESSMENT AND PLAN: This is a very pleasant 67 years old white female recently diagnosed with stage IV (T3, N0,  M1 a) non-small cell lung cancer, adenocarcinoma presented with large left lower lobe lung mass in addition to another lingular mass and malignant left pleural effusion diagnosed in September 2021.  The molecular studies showed positive PD-L1 expression of 95%.  The patient also has K-ras G12C mutation which will be an option for the second line treatment. With the high PD-L1 expression I discussed with the patient treatment with single agent Libtayo (Cempilimab) 350 mg IV every 3 weeks for up to 2 years unless the patient has evidence for disease progression or unacceptable toxicity. The patient started the first cycle of this treatment on January 15, 2020. She is status post 35 cycles.  She completed 2 years of treatment with immunotherapy. For the enlarging right axillary lymph node, the patient underwent palliative radiotherapy to this area. The patient is currently on observation and she is feeling fine with no concerning complaints. She had repeat CT scan of the chest, abdomen and pelvis performed recently.  I personally and independently reviewed the scan images and discussed the result and showed the images to the patient and her husband. Her scan showed stable  disease in general except for slight increase in some of the lymph node in the mediastinum and left lung nodule. I recommended for the patient to continue on observation with repeat CT scan of the chest, abdomen and pelvis in 3 months. If the patient develop any evidence for significant disease progression in the future, I will consider resuming her treatment with immunotherapy or targeted therapy. The patient was advised to call immediately if she has any concerning symptoms in the interval. For the anemia, I encouraged the patient to increase her iron rich diet and also to start taking oral iron tablet once daily with vitamin C or orange juice.   All questions were answered. The patient knows to call the clinic with any problems, questions or concerns. We can certainly see the patient much sooner if necessary. The total time spent in the appointment was 30 minutes.   Disclaimer: This note was dictated with voice recognition software. Similar sounding words can inadvertently be transcribed and may not be corrected upon review.

## 2022-07-22 ENCOUNTER — Other Ambulatory Visit: Payer: Self-pay

## 2022-08-02 ENCOUNTER — Other Ambulatory Visit: Payer: Self-pay | Admitting: Physician Assistant

## 2022-08-02 DIAGNOSIS — E039 Hypothyroidism, unspecified: Secondary | ICD-10-CM

## 2022-10-16 ENCOUNTER — Other Ambulatory Visit: Payer: Self-pay

## 2022-10-16 ENCOUNTER — Inpatient Hospital Stay: Payer: Medicare Other | Attending: Internal Medicine

## 2022-10-16 ENCOUNTER — Ambulatory Visit (HOSPITAL_COMMUNITY)
Admission: RE | Admit: 2022-10-16 | Discharge: 2022-10-16 | Disposition: A | Discharge status: Home / Self Care | Payer: Medicare Other | Source: Ambulatory Visit | Attending: Internal Medicine | Admitting: Internal Medicine

## 2022-10-16 ENCOUNTER — Encounter (HOSPITAL_COMMUNITY): Payer: Self-pay

## 2022-10-16 DIAGNOSIS — C3490 Malignant neoplasm of unspecified part of unspecified bronchus or lung: Secondary | ICD-10-CM | POA: Diagnosis present

## 2022-10-16 DIAGNOSIS — D649 Anemia, unspecified: Secondary | ICD-10-CM | POA: Insufficient documentation

## 2022-10-16 DIAGNOSIS — Z905 Acquired absence of kidney: Secondary | ICD-10-CM | POA: Diagnosis not present

## 2022-10-16 DIAGNOSIS — C3492 Malignant neoplasm of unspecified part of left bronchus or lung: Secondary | ICD-10-CM | POA: Diagnosis not present

## 2022-10-16 DIAGNOSIS — I700 Atherosclerosis of aorta: Secondary | ICD-10-CM | POA: Diagnosis not present

## 2022-10-16 DIAGNOSIS — C3432 Malignant neoplasm of lower lobe, left bronchus or lung: Secondary | ICD-10-CM | POA: Insufficient documentation

## 2022-10-16 DIAGNOSIS — R911 Solitary pulmonary nodule: Secondary | ICD-10-CM | POA: Diagnosis not present

## 2022-10-16 DIAGNOSIS — R5383 Other fatigue: Secondary | ICD-10-CM

## 2022-10-16 DIAGNOSIS — I2510 Atherosclerotic heart disease of native coronary artery without angina pectoris: Secondary | ICD-10-CM | POA: Diagnosis not present

## 2022-10-16 DIAGNOSIS — K769 Liver disease, unspecified: Secondary | ICD-10-CM | POA: Diagnosis not present

## 2022-10-16 LAB — CMP (CANCER CENTER ONLY)
ALT: 27 U/L (ref 0–44)
AST: 20 U/L (ref 15–41)
Albumin: 4.3 g/dL (ref 3.5–5.0)
Alkaline Phosphatase: 58 U/L (ref 38–126)
Anion gap: 7 (ref 5–15)
BUN: 33 mg/dL — ABNORMAL HIGH (ref 8–23)
CO2: 23 mmol/L (ref 22–32)
Calcium: 9.7 mg/dL (ref 8.9–10.3)
Chloride: 109 mmol/L (ref 98–111)
Creatinine: 1.07 mg/dL — ABNORMAL HIGH (ref 0.44–1.00)
GFR, Estimated: 57 mL/min — ABNORMAL LOW (ref >60)
Glucose, Bld: 109 mg/dL — ABNORMAL HIGH (ref 70–99)
Potassium: 4.7 mmol/L (ref 3.5–5.1)
Sodium: 139 mmol/L (ref 135–145)
Total Bilirubin: 0.3 mg/dL (ref 0.3–1.2)
Total Protein: 7.4 g/dL (ref 6.5–8.1)

## 2022-10-16 LAB — CBC WITH DIFFERENTIAL (CANCER CENTER ONLY)
Abs Immature Granulocytes: 0.02 10*3/uL (ref 0.00–0.07)
Basophils Absolute: 0 10*3/uL (ref 0.0–0.1)
Basophils Relative: 1 %
Eosinophils Absolute: 0.3 10*3/uL (ref 0.0–0.5)
Eosinophils Relative: 6 %
HCT: 37.9 % (ref 36.0–46.0)
Hemoglobin: 12.3 g/dL (ref 12.0–15.0)
Immature Granulocytes: 1 %
Lymphocytes Relative: 25 %
Lymphs Abs: 1.1 10*3/uL (ref 0.7–4.0)
MCH: 28.6 pg (ref 26.0–34.0)
MCHC: 32.5 g/dL (ref 30.0–36.0)
MCV: 88.1 fL (ref 80.0–100.0)
Monocytes Absolute: 0.3 10*3/uL (ref 0.1–1.0)
Monocytes Relative: 8 %
Neutro Abs: 2.6 10*3/uL (ref 1.7–7.7)
Neutrophils Relative %: 59 %
Platelet Count: 205 10*3/uL (ref 150–400)
RBC: 4.3 MIL/uL (ref 3.87–5.11)
RDW: 16.3 % — ABNORMAL HIGH (ref 11.5–15.5)
WBC Count: 4.3 10*3/uL (ref 4.0–10.5)
nRBC: 0 % (ref 0.0–0.2)

## 2022-10-16 LAB — TSH: TSH: 6.738 u[IU]/mL — ABNORMAL HIGH (ref 0.350–4.500)

## 2022-10-16 MED ORDER — IOHEXOL 300 MG/ML  SOLN
100.0000 mL | Freq: Once | INTRAMUSCULAR | Status: AC | PRN
  Administered 2022-10-16: 100 mL via INTRAVENOUS

## 2022-10-19 ENCOUNTER — Other Ambulatory Visit: Payer: Self-pay

## 2022-10-19 ENCOUNTER — Inpatient Hospital Stay: Payer: Medicare Other | Admitting: Internal Medicine

## 2022-10-19 VITALS — BP 140/69 | HR 49 | Temp 97.5°F | Resp 17 | Ht 64.0 in | Wt 145.2 lb

## 2022-10-19 DIAGNOSIS — C3490 Malignant neoplasm of unspecified part of unspecified bronchus or lung: Secondary | ICD-10-CM

## 2022-10-19 DIAGNOSIS — C3432 Malignant neoplasm of lower lobe, left bronchus or lung: Secondary | ICD-10-CM | POA: Diagnosis not present

## 2022-10-19 DIAGNOSIS — Z905 Acquired absence of kidney: Secondary | ICD-10-CM | POA: Diagnosis not present

## 2022-10-19 DIAGNOSIS — D649 Anemia, unspecified: Secondary | ICD-10-CM | POA: Diagnosis not present

## 2022-10-19 NOTE — Progress Notes (Signed)
[facility] [phone]   Fax:([phone]  OFFICE PROGRESS NOTE  [name], NP [address] [place] [address]  DIAGNOSIS: stage IV (T3, N0, M1 a) non-small cell lung cancer, adenocarcinoma presented with large left lower lobe lung mass in addition to another lingual mass and malignant left pleural effusion diagnosed in September 2021  Biomarker Findings Tumor Mutational Burden - 13 Muts/Mb Microsatellite status - MS-Stable Genomic Findings For a complete list of the genes assayed, please refer to the Appendix. KRAS G12C PRDM1 D243fs*46 TERT promoter -146C>T TP53 V157F 7 Disease relevant genes with no reportable alterations: ALK, BRAF, EGFR, ERBB2, MET, RET, ROS1   PDL1 Expression: 95%  PRIOR THERAPY:  1) Palliative radiotherapy to the enlarging right axillary lymphadenopathy under the care of Dr. Mitzi Hansen. 2) Libtayo (Cempilimab) 350 mg IV every 3 weeks.  First dose January 15, 2020.  Status post 35 cycles.  CURRENT THERAPY: Observation.  [name] 67 y.o. female returns to the clinic today for follow-up visit accompanied by her husband.  The patient is feeling fine today with no concerning complaints.  She denied having any chest pain, shortness of breath, cough or hemoptysis.  She has no nausea, vomiting, diarrhea or constipation.  She has no headache or visual changes.  She denied having any recent weight loss or night sweats.  She is here today for evaluation with repeat CT scan of the chest, abdomen and pelvis for restaging of her disease.  MEDICAL HISTORY: Past Medical History:  Diagnosis Date   Arthritis    Bilateral   Hyperlipidemia    Hypertension    Pleural effusion    Pneumonia    Stage IV adenocarcinoma of lung, left (HCC)     ALLERGIES:  is allergic to ace inhibitors.  MEDICATIONS:  Current Outpatient Medications  Medication Sig Dispense Refill   cholecalciferol (VITAMIN D3) 25  MCG (1000 UNIT) tablet Take 2,000 Units by mouth daily.     levothyroxine (SYNTHROID) 50 MCG tablet TAKE 1 TABLET BY MOUTH EVERY DAY BEFORE BREAKFAST 90 tablet 1   sertraline (ZOLOFT) 50 MG tablet Take 75 mg by mouth daily.     No current facility-administered medications for this visit.    SURGICAL HISTORY:  Past Surgical History:  Procedure Laterality Date   NEPHRECTOMY Left     REVIEW OF SYSTEMS:  A comprehensive review of systems was negative.   PHYSICAL EXAMINATION: General appearance: alert, cooperative, and no distress Head: Normocephalic, without obvious abnormality, atraumatic Neck: no adenopathy, no JVD, supple, symmetrical, trachea midline, and thyroid not enlarged, symmetric, no tenderness/mass/nodules Lymph nodes: Cervical, supraclavicular, and axillary nodes normal. Resp: clear to auscultation bilaterally Back: symmetric, no curvature. ROM normal. No CVA tenderness. Cardio: regular rate and rhythm, S1, S2 normal, no murmur, click, rub or gallop GI: soft, non-tender; bowel sounds normal; no masses,  no organomegaly Extremities: extremities normal, atraumatic, no cyanosis or edema  ECOG PERFORMANCE STATUS: 1 - Symptomatic but completely ambulatory  Blood pressure (!) 140/69, pulse (!) 49, temperature (!) 97.5 F (36.4 C), temperature source Oral, resp. rate 17, height 5\' 4"  (1.626 m), weight 145 lb 3.2 oz (65.9 kg), SpO2 100%.  LABORATORY DATA: Lab Results  Component Value Date   WBC 4.3 10/16/2022   HGB 12.3 10/16/2022   HCT 37.9 10/16/2022   MCV 88.1 10/16/2022   PLT 205 10/16/2022      Chemistry      Component Value  Date/Time   NA 139 10/16/2022 0839   K 4.7 10/16/2022 0839   CL 109 10/16/2022 0839   CO2 23 10/16/2022 0839   BUN 33 (H) 10/16/2022 0839   CREATININE 1.07 (H) 10/16/2022 0839      Component Value Date/Time   CALCIUM 9.7 10/16/2022 0839   ALKPHOS 58 10/16/2022 0839   AST 20 10/16/2022 0839   ALT 27 10/16/2022 0839   BILITOT 0.3  10/16/2022 0839       RADIOGRAPHIC STUDIES: CT Chest W Contrast  Result Date: 10/19/2022 CLINICAL DATA:  Stage IV non-small cell lung cancer history of immunotherapy and pelvic radiotherapy to RIGHT axial lymphadenopathy. * Tracking Code: BO * EXAM: CT CHEST, ABDOMEN, AND PELVIS WITH CONTRAST TECHNIQUE: Multidetector CT imaging of the chest, abdomen and pelvis was performed following the standard protocol during bolus administration of intravenous contrast. RADIATION DOSE REDUCTION: This exam was performed according to the departmental dose-optimization program which includes automated exposure control, adjustment of the mA and/or kV according to patient size and/or use of iterative reconstruction technique. CONTRAST:  OMNIPAQUE IOHEXOL 300 MG/ML  SOLN COMPARISON:  None Available. FINDINGS: CT CHEST FINDINGS Cardiovascular: Coronary artery calcification and aortic atherosclerotic calcification. Mediastinum/Nodes: Subcarinal lymph node measures 12 mm unchanged. Rounded node in the anterior mediastinum anterior to the RIGHT ventricle atrium measures 13 mm compared to 12 mm for no interval change. No new adenopathy. Lungs/Pleura: Irregular nodule in the periphery of the LEFT upper lobe measures 8 mm in maximum length compared 8 mm on prior (image 44/series 4) additional peripheral nodular consolidation/atelectasis is not changed from prior and suggestive radiation affect. Volume loss in the LEFT hemithorax Hyperinflation of the RIGHT lung.  No suspicious pulmonary nodules. Musculoskeletal: No aggressive osseous lesion. CT ABDOMEN AND PELVIS FINDINGS Hepatobiliary: Small hypodense lesion in the posterior RIGHT hepatic lobe measures 8 mm compared 8 mm (image 60/2) Pancreas: Pancreas is normal. No ductal dilatation. No pancreatic inflammation. Spleen: Normal spleen Adrenals/urinary tract: Adrenal glands normal. Post LEFT nephrectomy. RIGHT kidney normal. Ureter and bladder normal. Stomach/Bowel: Stomach,  small bowel, appendix, and cecum are normal. The colon and rectosigmoid colon are normal. Vascular/Lymphatic: Abdominal aorta is normal caliber. There is no retroperitoneal or periportal lymphadenopathy. No pelvic lymphadenopathy. Reproductive: Uterus and adnexa unremarkable. Other: No free fluid. Musculoskeletal: No aggressive osseous lesion. IMPRESSION: CHEST: 1. Stable mediastinal lymph nodes. 2. Stable LEFT upper lobe pulmonary nodule. 3. Stable pleuroparenchymal thickening in the RIGHT lung. PELVIS: 1. No evidence of metastatic disease in the abdomen pelvis. 2. Stable small hypodense lesion in the RIGHT hepatic lobe. 3. Post LEFT nephrectomy. 4.  Aortic Atherosclerosis (ICD10-I70.0). Electronically Signed   By: Genevive Bi M.D.   On: 10/19/2022 09:41   CT Abdomen Pelvis W Contrast  Result Date: 10/19/2022 CLINICAL DATA:  Stage IV non-small cell lung cancer history of immunotherapy and pelvic radiotherapy to RIGHT axial lymphadenopathy. * Tracking Code: BO * EXAM: CT CHEST, ABDOMEN, AND PELVIS WITH CONTRAST TECHNIQUE: Multidetector CT imaging of the chest, abdomen and pelvis was performed following the standard protocol during bolus administration of intravenous contrast. RADIATION DOSE REDUCTION: This exam was performed according to the departmental dose-optimization program which includes automated exposure control, adjustment of the mA and/or kV according to patient size and/or use of iterative reconstruction technique. CONTRAST:  OMNIPAQUE IOHEXOL 300 MG/ML  SOLN COMPARISON:  None Available. FINDINGS: CT CHEST FINDINGS Cardiovascular: Coronary artery calcification and aortic atherosclerotic calcification. Mediastinum/Nodes: Subcarinal lymph node measures 12 mm unchanged. Rounded node in the  anterior mediastinum anterior to the RIGHT ventricle atrium measures 13 mm compared to 12 mm for no interval change. No new adenopathy. Lungs/Pleura: Irregular nodule in the periphery of the LEFT upper lobe  measures 8 mm in maximum length compared 8 mm on prior (image 44/series 4) additional peripheral nodular consolidation/atelectasis is not changed from prior and suggestive radiation affect. Volume loss in the LEFT hemithorax Hyperinflation of the RIGHT lung.  No suspicious pulmonary nodules. Musculoskeletal: No aggressive osseous lesion. CT ABDOMEN AND PELVIS FINDINGS Hepatobiliary: Small hypodense lesion in the posterior RIGHT hepatic lobe measures 8 mm compared 8 mm (image 60/2) Pancreas: Pancreas is normal. No ductal dilatation. No pancreatic inflammation. Spleen: Normal spleen Adrenals/urinary tract: Adrenal glands normal. Post LEFT nephrectomy. RIGHT kidney normal. Ureter and bladder normal. Stomach/Bowel: Stomach, small bowel, appendix, and cecum are normal. The colon and rectosigmoid colon are normal. Vascular/Lymphatic: Abdominal aorta is normal caliber. There is no retroperitoneal or periportal lymphadenopathy. No pelvic lymphadenopathy. Reproductive: Uterus and adnexa unremarkable. Other: No free fluid. Musculoskeletal: No aggressive osseous lesion. IMPRESSION: CHEST: 1. Stable mediastinal lymph nodes. 2. Stable LEFT upper lobe pulmonary nodule. 3. Stable pleuroparenchymal thickening in the RIGHT lung. PELVIS: 1. No evidence of metastatic disease in the abdomen pelvis. 2. Stable small hypodense lesion in the RIGHT hepatic lobe. 3. Post LEFT nephrectomy. 4.  Aortic Atherosclerosis (ICD10-I70.0). Electronically Signed   By: Genevive Bi M.D.   On: 10/19/2022 09:41     ASSESSMENT AND PLAN: This is a very pleasant 67 years old white female recently diagnosed with stage IV (T3, N0, M1 a) non-small cell lung cancer, adenocarcinoma presented with large left lower lobe lung mass in addition to another lingular mass and malignant left pleural effusion diagnosed in September 2021.  The molecular studies showed positive PD-L1 expression of 95%.  The patient also has K-ras G12C mutation which will be an option  for the second line treatment. With the high PD-L1 expression I discussed with the patient treatment with single agent Libtayo (Cempilimab) 350 mg IV every 3 weeks for up to 2 years unless the patient has evidence for disease progression or unacceptable toxicity. The patient started the first cycle of this treatment on January 15, 2020. She is status post 35 cycles.  She completed 2 years of treatment with immunotherapy. For the enlarging right axillary lymph node, the patient underwent palliative radiotherapy to this area. The patient is currently on observation and she is feeling fine with no concerning complaints. She had repeat CT scan of the chest, abdomen and pelvis performed recently.  I personally and independently reviewed the scan and discussed the result with the patient and her husband. Her scan showed no concerning findings for disease progression. I recommended for her to continue on observation with repeat CT scan of the chest, abdomen and pelvis in 4 months. She was advised to call immediately if she has any other concerning symptoms in the interval. For the anemia, I encouraged the patient to increase her iron rich diet and also to start taking oral iron tablet once daily with vitamin C or orange juice.   All questions were answered. The patient knows to call the clinic with any problems, questions or concerns. We can certainly see the patient much sooner if necessary. The total time spent in the appointment was 20 minutes.   Disclaimer: This note was dictated with voice recognition software. Similar sounding words can inadvertently be transcribed and may not be corrected upon review.

## 2022-10-20 ENCOUNTER — Other Ambulatory Visit: Payer: Self-pay

## 2023-01-27 ENCOUNTER — Encounter: Payer: Self-pay | Admitting: Internal Medicine

## 2023-01-27 NOTE — Telephone Encounter (Signed)
Telephone call  

## 2023-02-10 ENCOUNTER — Other Ambulatory Visit: Payer: Self-pay | Admitting: Internal Medicine

## 2023-02-10 ENCOUNTER — Ambulatory Visit (HOSPITAL_COMMUNITY)
Admission: RE | Admit: 2023-02-10 | Discharge: 2023-02-10 | Disposition: A | Discharge status: Home / Self Care | Payer: Medicare Other | Source: Ambulatory Visit | Attending: Internal Medicine | Admitting: Internal Medicine

## 2023-02-10 ENCOUNTER — Inpatient Hospital Stay: Payer: Medicare Other | Attending: Internal Medicine

## 2023-02-10 DIAGNOSIS — M1990 Unspecified osteoarthritis, unspecified site: Secondary | ICD-10-CM | POA: Diagnosis not present

## 2023-02-10 DIAGNOSIS — K449 Diaphragmatic hernia without obstruction or gangrene: Secondary | ICD-10-CM | POA: Diagnosis not present

## 2023-02-10 DIAGNOSIS — K760 Fatty (change of) liver, not elsewhere classified: Secondary | ICD-10-CM | POA: Insufficient documentation

## 2023-02-10 DIAGNOSIS — Z905 Acquired absence of kidney: Secondary | ICD-10-CM | POA: Insufficient documentation

## 2023-02-10 DIAGNOSIS — R590 Localized enlarged lymph nodes: Secondary | ICD-10-CM | POA: Insufficient documentation

## 2023-02-10 DIAGNOSIS — Z85118 Personal history of other malignant neoplasm of bronchus and lung: Secondary | ICD-10-CM | POA: Diagnosis present

## 2023-02-10 DIAGNOSIS — C3490 Malignant neoplasm of unspecified part of unspecified bronchus or lung: Secondary | ICD-10-CM | POA: Insufficient documentation

## 2023-02-10 DIAGNOSIS — Z79899 Other long term (current) drug therapy: Secondary | ICD-10-CM | POA: Diagnosis not present

## 2023-02-10 LAB — CBC WITH DIFFERENTIAL (CANCER CENTER ONLY)
Abs Immature Granulocytes: 0.02 10*3/uL (ref 0.00–0.07)
Basophils Absolute: 0.1 10*3/uL (ref 0.0–0.1)
Basophils Relative: 2 %
Eosinophils Absolute: 0.3 10*3/uL (ref 0.0–0.5)
Eosinophils Relative: 7 %
HCT: 39.1 % (ref 36.0–46.0)
Hemoglobin: 12.7 g/dL (ref 12.0–15.0)
Immature Granulocytes: 1 %
Lymphocytes Relative: 24 %
Lymphs Abs: 0.9 10*3/uL (ref 0.7–4.0)
MCH: 30.6 pg (ref 26.0–34.0)
MCHC: 32.5 g/dL (ref 30.0–36.0)
MCV: 94.2 fL (ref 80.0–100.0)
Monocytes Absolute: 0.3 10*3/uL (ref 0.1–1.0)
Monocytes Relative: 8 %
Neutro Abs: 2.2 10*3/uL (ref 1.7–7.7)
Neutrophils Relative %: 58 %
Platelet Count: 211 10*3/uL (ref 150–400)
RBC: 4.15 MIL/uL (ref 3.87–5.11)
RDW: 13.1 % (ref 11.5–15.5)
WBC Count: 3.8 10*3/uL — ABNORMAL LOW (ref 4.0–10.5)
nRBC: 0 % (ref 0.0–0.2)

## 2023-02-10 LAB — CMP (CANCER CENTER ONLY)
ALT: 24 U/L (ref 0–44)
AST: 19 U/L (ref 15–41)
Albumin: 4.3 g/dL (ref 3.5–5.0)
Alkaline Phosphatase: 57 U/L (ref 38–126)
Anion gap: 4 — ABNORMAL LOW (ref 5–15)
BUN: 43 mg/dL — ABNORMAL HIGH (ref 8–23)
CO2: 26 mmol/L (ref 22–32)
Calcium: 9.8 mg/dL (ref 8.9–10.3)
Chloride: 108 mmol/L (ref 98–111)
Creatinine: 1.2 mg/dL — ABNORMAL HIGH (ref 0.44–1.00)
GFR, Estimated: 49 mL/min — ABNORMAL LOW (ref >60)
Glucose, Bld: 109 mg/dL — ABNORMAL HIGH (ref 70–99)
Potassium: 4.8 mmol/L (ref 3.5–5.1)
Sodium: 138 mmol/L (ref 135–145)
Total Bilirubin: 0.4 mg/dL (ref <1.2)
Total Protein: 7 g/dL (ref 6.5–8.1)

## 2023-02-10 MED ORDER — IOHEXOL 300 MG/ML  SOLN
100.0000 mL | Freq: Once | INTRAMUSCULAR | Status: AC | PRN
  Administered 2023-02-10: 100 mL via INTRAVENOUS

## 2023-02-17 ENCOUNTER — Inpatient Hospital Stay (HOSPITAL_BASED_OUTPATIENT_CLINIC_OR_DEPARTMENT_OTHER): Payer: Medicare Other | Admitting: Internal Medicine

## 2023-02-17 VITALS — BP 138/67 | HR 57 | Temp 98.0°F | Resp 16 | Ht 64.0 in | Wt 154.8 lb

## 2023-02-17 DIAGNOSIS — Z85118 Personal history of other malignant neoplasm of bronchus and lung: Secondary | ICD-10-CM | POA: Diagnosis not present

## 2023-02-17 DIAGNOSIS — C3490 Malignant neoplasm of unspecified part of unspecified bronchus or lung: Secondary | ICD-10-CM

## 2023-02-17 NOTE — Progress Notes (Signed)
[facility] Telephone:(336) [phone]   Fax:(336) [phone]  OFFICE PROGRESS NOTE  Gabrielle Party, NP [address] [name] [address]  DIAGNOSIS: stage IV (T3, N0, M1 a) non-small cell lung cancer, adenocarcinoma presented with large left lower lobe lung mass in addition to another lingual mass and malignant left pleural effusion diagnosed in September 2021  Biomarker Findings Tumor Mutational Burden - 13 Muts/Mb Microsatellite status - MS-Stable Genomic Findings For a complete list of the genes assayed, please refer to the Appendix. KRAS G12C PRDM1 D22fs*46 TERT promoter -146C>T TP53 V157F 7 Disease relevant genes with no reportable alterations: ALK, BRAF, EGFR, ERBB2, MET, RET, ROS1   PDL1 Expression: 95%  PRIOR THERAPY:  1) Palliative radiotherapy to the enlarging right axillary lymphadenopathy under the care of Dr. Mitzi Hansen. 2) Libtayo (Cempilimab) 350 mg IV every 3 weeks.  First dose January 15, 2020.  Status post 35 cycles.  CURRENT THERAPY: Observation.  [name] 67 y.o. female returns to the clinic today for follow-up visit accompanied by her husband.Discussed the use of AI scribe software for clinical note transcription with the patient, who gave verbal consent to proceed.  History of Present Illness   Gabrielle [name], a 67 year old patient, was diagnosed with stage four non-small cell lung cancer, adenocarcinoma, in October 2021. Molecular markers revealed a KRAS G12C mutation and PD-L1 exhalation of 95%, leading to a treatment plan of immunotherapy with Libtayo or cemiplimab. The patient completed two years of this treatment in October 2023 and has been under observation since then.  In the four months since the last consultation, the patient reports feeling "fat and sassy" and generally well. There have been no complaints of chest pain, breathing issues, nausea, vomiting, diarrhea, or skin rash. The only discomfort  mentioned was a bit of arthritis. The patient denies experiencing any fatigue.  The patient's scans have remained stable, with one area slightly increased in size. The nodules in the lung have remained stable. The patient's lab work has also remained stable.       MEDICAL HISTORY: Past Medical History:  Diagnosis Date   Arthritis    Bilateral   Hyperlipidemia    Hypertension    Pleural effusion    Pneumonia    Stage IV adenocarcinoma of lung, left (HCC)     ALLERGIES:  is allergic to ace inhibitors.  MEDICATIONS:  Current Outpatient Medications  Medication Sig Dispense Refill   cholecalciferol (VITAMIN D3) 25 MCG (1000 UNIT) tablet Take 2,000 Units by mouth daily.     levothyroxine (SYNTHROID) 50 MCG tablet TAKE 1 TABLET BY MOUTH EVERY DAY BEFORE BREAKFAST 90 tablet 1   sertraline (ZOLOFT) 50 MG tablet Take 75 mg by mouth daily.     No current facility-administered medications for this visit.    SURGICAL HISTORY:  Past Surgical History:  Procedure Laterality Date   NEPHRECTOMY Left     REVIEW OF SYSTEMS:  Constitutional: negative Eyes: negative Ears, nose, mouth, throat, and face: negative Respiratory: negative Cardiovascular: negative Gastrointestinal: negative Genitourinary:negative Integument/breast: negative Hematologic/lymphatic: negative Musculoskeletal:positive for arthralgias Neurological: negative Behavioral/Psych: negative Endocrine: negative Allergic/Immunologic: negative   PHYSICAL EXAMINATION: General appearance: alert, cooperative, and no distress Head: Normocephalic, without obvious abnormality, atraumatic Neck: no adenopathy, no JVD, supple, symmetrical, trachea midline, and thyroid not enlarged, symmetric, no tenderness/mass/nodules Lymph nodes: Cervical, supraclavicular, and axillary nodes normal. Resp: clear to auscultation bilaterally Back: symmetric, no curvature. ROM normal. No CVA tenderness. Cardio: regular  rate and rhythm, S1, S2  normal, no murmur, click, rub or gallop GI: soft, non-tender; bowel sounds normal; no masses,  no organomegaly Extremities: extremities normal, atraumatic, no cyanosis or edema Neurologic: Alert and oriented X 3, normal strength and tone. Normal symmetric reflexes. Normal coordination and gait  ECOG PERFORMANCE STATUS: 1 - Symptomatic but completely ambulatory  Blood pressure 138/67, pulse (!) 57, temperature 98 F (36.7 C), temperature source Temporal, resp. rate 16, height 5\' 4"  (1.626 m), weight 154 lb 12.8 oz (70.2 kg), SpO2 98%.  LABORATORY DATA: Lab Results  Component Value Date   WBC 3.8 (L) 02/10/2023   HGB 12.7 02/10/2023   HCT 39.1 02/10/2023   MCV 94.2 02/10/2023   PLT 211 02/10/2023      Chemistry      Component Value Date/Time   NA 138 02/10/2023 1017   K 4.8 02/10/2023 1017   CL 108 02/10/2023 1017   CO2 26 02/10/2023 1017   BUN 43 (H) 02/10/2023 1017   CREATININE 1.20 (H) 02/10/2023 1017      Component Value Date/Time   CALCIUM 9.8 02/10/2023 1017   ALKPHOS 57 02/10/2023 1017   AST 19 02/10/2023 1017   ALT 24 02/10/2023 1017   BILITOT 0.4 02/10/2023 1017       RADIOGRAPHIC STUDIES: CT CHEST ABDOMEN PELVIS W CONTRAST  Result Date: 02/16/2023 CLINICAL DATA:  Staging non-small-cell lung cancer. * Tracking Code: BO * EXAM: CT CHEST, ABDOMEN, AND PELVIS WITH CONTRAST TECHNIQUE: Multidetector CT imaging of the chest, abdomen and pelvis was performed following the standard protocol during bolus administration of intravenous contrast. RADIATION DOSE REDUCTION: This exam was performed according to the departmental dose-optimization program which includes automated exposure control, adjustment of the mA and/or kV according to patient size and/or use of iterative reconstruction technique. CONTRAST:  OMNIPAQUE IOHEXOL 300 MG/ML  SOLN COMPARISON:  CT 10/16/2022 and older. FINDINGS: CT CHEST FINDINGS Cardiovascular: Heart is nonenlarged. Coronary artery  calcifications are seen. No pericardial effusion. The thoracic aorta has a normal course and caliber with scattered vascular calcifications Mediastinum/Nodes: Small hiatal hernia. Small thyroid gland. Specific abnormal lymph node enlargement identified in the axillary region or hilum. There are some enlarged nodes along the thoracic inlet on the left, supraclavicular region. Example today series 2, image 8 measures 20 x 15 mm. Previously 15 x 15 mm. Additional focus on image 6. There is also an enlarged subcarinal node which previously measured 12 mm in short axis and today 13 mm in short axis on series 2, image 25. Lymph node adjacent to the heart in the right atrium anteriorly is also stable today on series 2, image 31 at 12 mm. Lungs/Pleura: There is some linear opacity seen in the right lung likely scar or atelectasis. No consolidation, pneumothorax or effusion. Tiny right-sided lung nodule measuring 3 mm on series 4, image 41 is stable. On the left there is volume loss identified with shift of the mediastinum from right-to-left. Areas of pleural thickening and parenchymal opacities seen again towards the lingula and anterior left upper lobe. Juxtapleural nodule is stable on series 4, image 40 left upper lobe laterally measuring 8 mm. Loculated areas of pleural fluid again seen on the left as well. Stable subpleural nodule posteriorly superior segment left lower lobe measuring 13 mm. Series 2, image 27. Musculoskeletal: Mild degenerative changes along the spine. CT ABDOMEN PELVIS FINDINGS Hepatobiliary: Fatty liver infiltration. Stable low-attenuation lesion posteriorly in the right hepatic lobe on image 55 of series 2. Further  attention on follow-up. Gallbladder is distended. Patent portal vein. Pancreas: Global atrophy of the pancreas. Spleen: Normal in size without focal abnormality. Adrenals/Urinary Tract: Adrenal glands are preserved. Surgical changes from left nephrectomy. No abnormal soft tissue in the  surgical bed. There is some mild atrophy of the right kidney. No enhancing right-sided renal mass or collecting system dilatation. The right ureter has a normal course and caliber extending down to the urinary bladder. Preserved contours of the urinary bladder. Stomach/Bowel: On this non oral contrast exam, the stomach and small bowel are nondilated. There is moderate diffuse colonic stool. Slightly redundant course of the sigmoid colon. Few diverticula identified in the sigmoid region. Vascular/Lymphatic: Scattered vascular calcifications along the aorta and branch vessels. Normal caliber aorta and IVC. Few prominent collateral vessels identified along the left hemipelvis posteriorly mesenteric venous structures. Unchanged from prior few prominent but less than a cm in size in short axis retroperitoneal nodes identified, nonpathologic by size criteria. Extent is similar to previous. Few may be slightly larger. For example the node left para-aortic in short axis on series 2, image 65 now measures 8 mm and previously 6 mm. Reproductive: Uterus and bilateral adnexa are unremarkable. Other: No free intra-air or free fluid.  Mild anasarca. Musculoskeletal: Curvature of the spine with some scattered degenerative changes. IMPRESSION: Scattered mildly enlarged lymph nodes identified in the thorax including mediastinal and left thoracic inlet/supraclavicular. At least 1 nodes supraclavicular slightly larger today. Stable appearance of the thorax with volume loss of the left hemithorax, pleural thickening, loculated fluid and scattered nodularity. Few prominent retroperitoneal nodes identified. The majority appears similar although there is 1 which is slightly larger. Attention on follow-up. Small hiatal hernia.  Fatty liver infiltration. Surgical changes of prior left nephrectomy. Electronically Signed   By: Karen Kays M.D.   On: 02/16/2023 14:48     ASSESSMENT AND PLAN: This is a very pleasant 67 years old white  female recently diagnosed with stage IV (T3, N0, M1 a) non-small cell lung cancer, adenocarcinoma presented with large left lower lobe lung mass in addition to another lingular mass and malignant left pleural effusion diagnosed in September 2021.  The molecular studies showed positive PD-L1 expression of 95%.  The patient also has K-ras G12C mutation which will be an option for the second line treatment. With the high PD-L1 expression I discussed with the patient treatment with single agent Libtayo (Cempilimab) 350 mg IV every 3 weeks for up to 2 years unless the patient has evidence for disease progression or unacceptable toxicity. The patient started the first cycle of this treatment on January 15, 2020. She is status post 35 cycles.  She completed 2 years of treatment with immunotherapy. For the enlarging right axillary lymph node, the patient underwent palliative radiotherapy to this area. The patient is currently on observation and she is feeling fine with no concerning complaints. She had repeat CT scan of the chest, abdomen and pelvis performed recently.  I personally and independently reviewed the scan and discussed the result with the patient and her husband.  Her scan showed stable disease with no concerning findings for progression except for slightly increased size of prominent lymph in the retroperitoneum.    Stage IV Non-Small Cell Lung Cancer (NSCLC), Adenocarcinoma Diagnosed in October 2021 with KRAS G12C mutation and PD-L1 expression of 95%. Treated with cemiplimab Lucianne Lei) for two years, completed in October 2023. Currently under observation. Recent scan shows stable disease with one abdominal lymph node slightly increased in size. No  new symptoms reported. Lab work remains stable. Discussed the importance of continued observation and regular scans to monitor disease progression. Reports feeling well with no significant side effects from previous treatment. - Continue observation -  Repeat scan in four months - Schedule follow-up appointment in four months (early April or late March) - Perform scan one week prior to follow-up appointment  Arthritis Reports mild arthritis. No new symptoms reported. - Monitor symptoms.   The patient was advised to call immediately if she has any other concerning symptoms in the interval. All questions were answered. The patient knows to call the clinic with any problems, questions or concerns. We can certainly see the patient much sooner if necessary. The total time spent in the appointment was 30 minutes.   Disclaimer: This note was dictated with voice recognition software. Similar sounding words can inadvertently be transcribed and may not be corrected upon review.

## 2023-02-20 ENCOUNTER — Other Ambulatory Visit: Payer: Self-pay

## 2023-04-03 ENCOUNTER — Other Ambulatory Visit: Payer: Self-pay | Admitting: Physician Assistant

## 2023-04-03 DIAGNOSIS — E039 Hypothyroidism, unspecified: Secondary | ICD-10-CM

## 2023-06-09 ENCOUNTER — Other Ambulatory Visit: Payer: Self-pay

## 2023-06-09 ENCOUNTER — Other Ambulatory Visit: Payer: Self-pay | Admitting: Internal Medicine

## 2023-06-09 ENCOUNTER — Ambulatory Visit (HOSPITAL_COMMUNITY)
Admission: RE | Admit: 2023-06-09 | Discharge: 2023-06-09 | Disposition: A | Discharge status: Home / Self Care | Source: Ambulatory Visit | Attending: Internal Medicine | Admitting: Internal Medicine

## 2023-06-09 ENCOUNTER — Inpatient Hospital Stay: Payer: Medicare Other | Attending: Internal Medicine

## 2023-06-09 DIAGNOSIS — C3490 Malignant neoplasm of unspecified part of unspecified bronchus or lung: Secondary | ICD-10-CM | POA: Diagnosis present

## 2023-06-09 DIAGNOSIS — C3432 Malignant neoplasm of lower lobe, left bronchus or lung: Secondary | ICD-10-CM | POA: Insufficient documentation

## 2023-06-09 DIAGNOSIS — Z9221 Personal history of antineoplastic chemotherapy: Secondary | ICD-10-CM | POA: Insufficient documentation

## 2023-06-09 DIAGNOSIS — Z79899 Other long term (current) drug therapy: Secondary | ICD-10-CM | POA: Insufficient documentation

## 2023-06-09 LAB — CBC WITH DIFFERENTIAL (CANCER CENTER ONLY)
Abs Immature Granulocytes: 0.06 10*3/uL (ref 0.00–0.07)
Basophils Absolute: 0.1 10*3/uL (ref 0.0–0.1)
Basophils Relative: 1 %
Eosinophils Absolute: 0.2 10*3/uL (ref 0.0–0.5)
Eosinophils Relative: 4 %
HCT: 38.9 % (ref 36.0–46.0)
Hemoglobin: 12.4 g/dL (ref 12.0–15.0)
Immature Granulocytes: 1 %
Lymphocytes Relative: 20 %
Lymphs Abs: 1 10*3/uL (ref 0.7–4.0)
MCH: 29.8 pg (ref 26.0–34.0)
MCHC: 31.9 g/dL (ref 30.0–36.0)
MCV: 93.5 fL (ref 80.0–100.0)
Monocytes Absolute: 0.7 10*3/uL (ref 0.1–1.0)
Monocytes Relative: 13 %
Neutro Abs: 3.1 10*3/uL (ref 1.7–7.7)
Neutrophils Relative %: 61 %
Platelet Count: 193 10*3/uL (ref 150–400)
RBC: 4.16 MIL/uL (ref 3.87–5.11)
RDW: 13 % (ref 11.5–15.5)
WBC Count: 5.2 10*3/uL (ref 4.0–10.5)
nRBC: 0 % (ref 0.0–0.2)

## 2023-06-09 LAB — CMP (CANCER CENTER ONLY)
ALT: 38 U/L (ref 0–44)
AST: 28 U/L (ref 15–41)
Albumin: 4.3 g/dL (ref 3.5–5.0)
Alkaline Phosphatase: 79 U/L (ref 38–126)
Anion gap: 6 (ref 5–15)
BUN: 23 mg/dL (ref 8–23)
CO2: 30 mmol/L (ref 22–32)
Calcium: 10 mg/dL (ref 8.9–10.3)
Chloride: 103 mmol/L (ref 98–111)
Creatinine: 1.3 mg/dL — ABNORMAL HIGH (ref 0.44–1.00)
GFR, Estimated: 45 mL/min — ABNORMAL LOW (ref >60)
Glucose, Bld: 132 mg/dL — ABNORMAL HIGH (ref 70–99)
Potassium: 4.3 mmol/L (ref 3.5–5.1)
Sodium: 139 mmol/L (ref 135–145)
Total Bilirubin: 0.4 mg/dL (ref 0.0–1.2)
Total Protein: 7.3 g/dL (ref 6.5–8.1)

## 2023-06-09 MED ORDER — IOHEXOL 300 MG/ML  SOLN
80.0000 mL | Freq: Once | INTRAMUSCULAR | Status: AC | PRN
  Administered 2023-06-09: 80 mL via INTRAVENOUS

## 2023-06-17 ENCOUNTER — Inpatient Hospital Stay (HOSPITAL_BASED_OUTPATIENT_CLINIC_OR_DEPARTMENT_OTHER): Payer: Medicare Other | Admitting: Internal Medicine

## 2023-06-17 ENCOUNTER — Other Ambulatory Visit: Payer: Self-pay

## 2023-06-17 VITALS — BP 152/69 | HR 63 | Temp 97.5°F | Resp 17 | Wt 161.0 lb

## 2023-06-17 DIAGNOSIS — Z79899 Other long term (current) drug therapy: Secondary | ICD-10-CM | POA: Diagnosis not present

## 2023-06-17 DIAGNOSIS — C3490 Malignant neoplasm of unspecified part of unspecified bronchus or lung: Secondary | ICD-10-CM

## 2023-06-17 DIAGNOSIS — C3432 Malignant neoplasm of lower lobe, left bronchus or lung: Secondary | ICD-10-CM | POA: Diagnosis present

## 2023-06-17 DIAGNOSIS — Z9221 Personal history of antineoplastic chemotherapy: Secondary | ICD-10-CM | POA: Diagnosis not present

## 2023-06-17 NOTE — Progress Notes (Signed)
 [facility] [phone]   [phone]  OFFICE PROGRESS NOTE  Gabrielle Party, NP [address] Gabrielle [name] [address]  DIAGNOSIS: stage IV (T3, N0, M1 a) non-small cell lung cancer, adenocarcinoma presented with large left lower lobe lung mass in addition to another lingual mass and malignant left pleural effusion diagnosed in September 2021  Biomarker Findings Tumor Mutational Burden - 13 Muts/Mb Microsatellite status - MS-Stable Genomic Findings For a complete list of the genes assayed, please refer to the Appendix. KRAS G12C PRDM1 D257fs*46 TERT promoter -146C>T TP53 V157F 7 Disease relevant genes with no reportable alterations: ALK, BRAF, EGFR, ERBB2, MET, RET, ROS1   PDL1 Expression: 95%  PRIOR THERAPY:  1) Palliative radiotherapy to the enlarging right axillary lymphadenopathy under the care of Dr. Mitzi Hansen. 2) Libtayo (Cempilimab) 350 mg IV every 3 weeks.  First dose January 15, 2020.  Status post 35 cycles.  CURRENT THERAPY: Observation.  [name] 68 y.o. female returns to the clinic today for follow-up visit accompanied by her husband.Discussed the use of AI scribe software for clinical note transcription with the patient, who gave verbal consent to proceed.  History of Present Illness   Gabrielle [name] "[name]" is a 68 year old female with stage four non-small cell lung cancer who presents for evaluation and repeat imaging studies. She is accompanied by her husband.  She has a history of stage four non-small cell lung cancer, adenocarcinoma, diagnosed in September 2021 with a positive KRAS G12C mutation and PD-L1 expression of 95%. She underwent palliative radiotherapy for enlarging right axillary lymphadenopathy and completed treatment with single-agent immunotherapy using Libtayo (cemiplimab) in October 2023. Since then, she has been under observation.  Recent imaging studies show stable disease with  some lymph nodes in the neck area changing in size but remaining small.  She feels 'all right' and mentions gaining weight, indicating a stable condition. No chest pain, breathing issues, nausea, vomiting, diarrhea, headaches, changes in vision, or sweating in the legs. She notes having a lot of phlegm recently.       MEDICAL HISTORY: Past Medical History:  Diagnosis Date   Arthritis    Bilateral   Hyperlipidemia    Hypertension    Pleural effusion    Pneumonia    Stage IV adenocarcinoma of lung, left (HCC)     ALLERGIES:  is allergic to ace inhibitors.  MEDICATIONS:  Current Outpatient Medications  Medication Sig Dispense Refill   cholecalciferol (VITAMIN D3) 25 MCG (1000 UNIT) tablet Take 2,000 Units by mouth daily.     levothyroxine (SYNTHROID) 50 MCG tablet TAKE 1 TABLET BY MOUTH EVERY DAY BEFORE BREAKFAST 90 tablet 1   sertraline (ZOLOFT) 50 MG tablet Take 75 mg by mouth daily.     No current facility-administered medications for this visit.    SURGICAL HISTORY:  Past Surgical History:  Procedure Laterality Date   NEPHRECTOMY Left     REVIEW OF SYSTEMS:  Constitutional: negative Eyes: negative Ears, nose, mouth, throat, and face: negative Respiratory: negative Cardiovascular: negative Gastrointestinal: negative Genitourinary:negative Integument/breast: negative Hematologic/lymphatic: negative Musculoskeletal:positive for arthralgias Neurological: negative Behavioral/Psych: negative Endocrine: negative Allergic/Immunologic: negative   PHYSICAL EXAMINATION: General appearance: alert, cooperative, and no distress Head: Normocephalic, without obvious abnormality, atraumatic Neck: no adenopathy, no JVD, supple, symmetrical, trachea midline, and thyroid not enlarged, symmetric, no tenderness/mass/nodules Lymph nodes: Cervical, supraclavicular, and axillary nodes normal. Resp: clear to auscultation bilaterally Back: symmetric, no curvature.  ROM normal. No CVA  tenderness. Cardio: regular rate and rhythm, S1, S2 normal, no murmur, click, rub or gallop GI: soft, non-tender; bowel sounds normal; no masses,  no organomegaly Extremities: extremities normal, atraumatic, no cyanosis or edema Neurologic: Alert and oriented X 3, normal strength and tone. Normal symmetric reflexes. Normal coordination and gait  ECOG PERFORMANCE STATUS: 1 - Symptomatic but completely ambulatory  Blood pressure (!) 152/69, pulse 63, temperature (!) 97.5 F (36.4 C), temperature source Temporal, resp. rate 17, weight 161 lb (73 kg), SpO2 100%.  LABORATORY DATA: Lab Results  Component Value Date   WBC 5.2 06/09/2023   HGB 12.4 06/09/2023   HCT 38.9 06/09/2023   MCV 93.5 06/09/2023   PLT 193 06/09/2023      Chemistry      Component Value Date/Time   NA 139 06/09/2023 0917   K 4.3 06/09/2023 0917   CL 103 06/09/2023 0917   CO2 30 06/09/2023 0917   BUN 23 06/09/2023 0917   CREATININE 1.30 (H) 06/09/2023 0917      Component Value Date/Time   CALCIUM 10.0 06/09/2023 0917   ALKPHOS 79 06/09/2023 0917   AST 28 06/09/2023 0917   ALT 38 06/09/2023 0917   BILITOT 0.4 06/09/2023 0917       RADIOGRAPHIC STUDIES: CT CHEST ABDOMEN PELVIS W CONTRAST Result Date: 06/17/2023 CLINICAL DATA:  History of multifocal left lung adenocarcinoma and malignant left pleural effusion. Surveillance. * Tracking Code: BO * EXAM: CT CHEST, ABDOMEN, AND PELVIS WITH CONTRAST TECHNIQUE: Multidetector CT imaging of the chest, abdomen and pelvis was performed following the standard protocol during bolus administration of intravenous contrast. RADIATION DOSE REDUCTION: This exam was performed according to the departmental dose-optimization program which includes automated exposure control, adjustment of the mA and/or kV according to patient size and/or use of iterative reconstruction technique. CONTRAST:  80mL OMNIPAQUE IOHEXOL 300 MG/ML  SOLN COMPARISON:  CT chest, abdomen, and pelvis dated  02/10/2023 FINDINGS: CT CHEST FINDINGS Cardiovascular: Normal heart size. No significant pericardial fluid/thickening. Great vessels are normal in course and caliber. No central pulmonary emboli. Mediastinum/Nodes: Imaged thyroid gland without nodules meeting criteria for imaging follow-up by size. Small hiatal hernia. Waxing and waning left supraclavicular lymphadenopathy, for example 15 mm (2:9), previously 13 mm, and 9 mm (2:10), previously 15 mm. Slightly increased size of 11 mm prevascular (2:20), previously 8 mm and 11 mm left hilar (2:29), previously 7 mm. 13 mm subcarinal (2:26) and 12 mm right pericardial lymph node (2:32), unchanged. Lungs/Pleura: The central airways are patent. Similar left-sided volume loss with slight leftward mediastinal shift. Similar 9 x 5 mm subpleural left upper lobe nodule (4:42). Unchanged left upper and lower lobe parenchymal bands and rounded subpleural densities, likely round atelectasis, including a superior segment left lower lobe at 13 x 9 mm ovoid nodule (4:67). Unchanged 3 mm peripheral right upper lobe nodule (4:46). New 6 x 6 mm perifissural posterior right upper lobe nodule (4:31). No pneumothorax. Unchanged loculated left pleural effusion/thickening. Musculoskeletal: Healing right lateral fifth through eighth rib fractures. Multilevel degenerative changes of the thoracic spine. CT ABDOMEN PELVIS FINDINGS Hepatobiliary: Unchanged segment 6 subcentimeter hypodensity (2:57). No intra or extrahepatic biliary ductal dilation. Normal gallbladder. Pancreas: Mildly atrophic pancreas, as before. Spleen: Normal in size without focal abnormality. Adrenals/Urinary Tract: No adrenal nodules. Left nephrectomy. No abnormal soft tissue in the nephrectomy bed. No right suspicious renal masses, hydronephrosis, or calculi. No focal bladder wall thickening. Stomach/Bowel: Normal appearance of the stomach. No evidence of bowel  wall thickening, distention, or inflammatory changes. Colonic  diverticulosis without acute diverticulitis. Normal appendix. Vascular/Lymphatic: Aortic atherosclerosis. No enlarged abdominal or pelvic lymph nodes. Similar prominent subcentimeter retroperitoneal lymph nodes. Reproductive: Status post hysterectomy. No adnexal masses. Other: No free fluid, fluid collection, or free air. Ovoid fat density along the transverse colon (2:69), likely sequela of prior fat necrosis. Musculoskeletal: No acute or abnormal lytic or blastic osseous findings. Multilevel degenerative changes of the lumbar spine. Small fat-containing paraumbilical hernia. IMPRESSION: 1. Waxing and waning left supraclavicular lymphadenopathy and slightly increased size of prevascular and left hilar lymph nodes, indeterminate. 2. New 6 x 6 mm perifissural posterior right upper lobe nodule, indeterminate. 3. Unchanged loculated left pleural effusion/thickening and adjacent parenchymal scarring/round atelectasis. 4. Healing right lateral fifth through eighth rib fractures. 5. No definite evidence of metastatic disease in the abdomen or pelvis. 6.  Aortic Atherosclerosis (ICD10-I70.0). Electronically Signed   By: Agustin Cree M.D.   On: 06/17/2023 10:13     ASSESSMENT AND PLAN: This is a very pleasant 68 years old white female recently diagnosed with stage IV (T3, N0, M1 a) non-small cell lung cancer, adenocarcinoma presented with large left lower lobe lung mass in addition to another lingular mass and malignant left pleural effusion diagnosed in September 2021.  The molecular studies showed positive PD-L1 expression of 95%.  The patient also has K-ras G12C mutation which will be an option for the second line treatment. With the high PD-L1 expression I discussed with the patient treatment with single agent Libtayo (Cempilimab) 350 mg IV every 3 weeks for up to 2 years unless the patient has evidence for disease progression or unacceptable toxicity. The patient started the first cycle of this treatment on January 15, 2020. She is status post 35 cycles.  She completed 2 years of treatment with immunotherapy. For the enlarging right axillary lymph node, the patient underwent palliative radiotherapy to this area. The patient is currently on observation with no concerning complaints. She had repeat CT scan of the chest, abdomen and pelvis performed recently.  I personally independently reviewed the scan images and discussed the result with the patient and her husband.    Stage IV non-small cell lung cancer (NSCLC) with KRAS G12C mutation and PD-L1 expression Bronda Alfred has stage IV NSCLC, adenocarcinoma subtype, with a KRAS G12C mutation and high PD-L1 expression (95%). Diagnosed in September 2021, she received palliative radiotherapy for right axillary lymphadenopathy and single-agent immunotherapy with cemiplimab in October 2023. Current imaging indicates well-managed disease with variable size changes in neck lymph nodes and a new 6x6 mm indeterminate nodule in the posterior right upper lobe, possibly cancerous or a lymph node between fissures, requiring close monitoring. - Schedule follow-up imaging in 3-4 months to reassess lymph nodes and lung nodule. - Instruct her to report any new symptoms or changes.    All questions were answered. The patient knows to call the clinic with any problems, questions or concerns. We can certainly see the patient much sooner if necessary. The total time spent in the appointment was 30 minutes.   Disclaimer: This note was dictated with voice recognition software. Similar sounding words can inadvertently be transcribed and may not be corrected upon review.

## 2023-06-18 ENCOUNTER — Telehealth: Payer: Self-pay | Admitting: Internal Medicine

## 2023-06-18 NOTE — Telephone Encounter (Signed)
 Scheduled appointments around a CT scan expected date. Left the patient a voicemail.

## 2023-06-19 ENCOUNTER — Other Ambulatory Visit: Payer: Self-pay

## 2023-06-21 ENCOUNTER — Telehealth: Payer: Self-pay

## 2023-06-21 NOTE — Telephone Encounter (Signed)
 Patient called in over the weekend stating she missed a call from the office. LVM with patient that scheduler called on Friday and left a message about appts. Asked for a return call with any questions.

## 2023-10-11 ENCOUNTER — Inpatient Hospital Stay: Attending: Internal Medicine

## 2023-10-11 ENCOUNTER — Encounter (HOSPITAL_COMMUNITY): Payer: Self-pay

## 2023-10-11 ENCOUNTER — Ambulatory Visit (HOSPITAL_COMMUNITY)
Admission: RE | Admit: 2023-10-11 | Discharge: 2023-10-11 | Disposition: A | Discharge status: Home / Self Care | Source: Ambulatory Visit | Attending: Internal Medicine | Admitting: Internal Medicine

## 2023-10-11 DIAGNOSIS — C3432 Malignant neoplasm of lower lobe, left bronchus or lung: Secondary | ICD-10-CM | POA: Insufficient documentation

## 2023-10-11 DIAGNOSIS — Z79899 Other long term (current) drug therapy: Secondary | ICD-10-CM | POA: Insufficient documentation

## 2023-10-11 DIAGNOSIS — C3490 Malignant neoplasm of unspecified part of unspecified bronchus or lung: Secondary | ICD-10-CM

## 2023-10-11 LAB — CBC WITH DIFFERENTIAL (CANCER CENTER ONLY)
Abs Immature Granulocytes: 0.05 K/uL (ref 0.00–0.07)
Basophils Absolute: 0.1 K/uL (ref 0.0–0.1)
Basophils Relative: 1 %
Eosinophils Absolute: 0.3 K/uL (ref 0.0–0.5)
Eosinophils Relative: 5 %
HCT: 39 % (ref 36.0–46.0)
Hemoglobin: 12.4 g/dL (ref 12.0–15.0)
Immature Granulocytes: 1 %
Lymphocytes Relative: 22 %
Lymphs Abs: 1.2 K/uL (ref 0.7–4.0)
MCH: 29.9 pg (ref 26.0–34.0)
MCHC: 31.8 g/dL (ref 30.0–36.0)
MCV: 94 fL (ref 80.0–100.0)
Monocytes Absolute: 0.6 K/uL (ref 0.1–1.0)
Monocytes Relative: 10 %
Neutro Abs: 3.4 K/uL (ref 1.7–7.7)
Neutrophils Relative %: 61 %
Platelet Count: 190 K/uL (ref 150–400)
RBC: 4.15 MIL/uL (ref 3.87–5.11)
RDW: 13 % (ref 11.5–15.5)
WBC Count: 5.5 K/uL (ref 4.0–10.5)
nRBC: 0 % (ref 0.0–0.2)

## 2023-10-11 LAB — CMP (CANCER CENTER ONLY)
ALT: 27 U/L (ref 0–44)
AST: 21 U/L (ref 15–41)
Albumin: 4.4 g/dL (ref 3.5–5.0)
Alkaline Phosphatase: 61 U/L (ref 38–126)
Anion gap: 5 (ref 5–15)
BUN: 34 mg/dL — ABNORMAL HIGH (ref 8–23)
CO2: 28 mmol/L (ref 22–32)
Calcium: 9.8 mg/dL (ref 8.9–10.3)
Chloride: 106 mmol/L (ref 98–111)
Creatinine: 1.31 mg/dL — ABNORMAL HIGH (ref 0.44–1.00)
GFR, Estimated: 44 mL/min — ABNORMAL LOW (ref >60)
Glucose, Bld: 118 mg/dL — ABNORMAL HIGH (ref 70–99)
Potassium: 5.4 mmol/L — ABNORMAL HIGH (ref 3.5–5.1)
Sodium: 139 mmol/L (ref 135–145)
Total Bilirubin: 0.4 mg/dL (ref 0.0–1.2)
Total Protein: 7.2 g/dL (ref 6.5–8.1)

## 2023-10-11 MED ORDER — IOHEXOL 300 MG/ML  SOLN
75.0000 mL | Freq: Once | INTRAMUSCULAR | Status: AC | PRN
  Administered 2023-10-11: 75 mL via INTRAVENOUS

## 2023-10-14 ENCOUNTER — Other Ambulatory Visit: Payer: Self-pay | Admitting: Physician Assistant

## 2023-10-14 DIAGNOSIS — E039 Hypothyroidism, unspecified: Secondary | ICD-10-CM

## 2023-10-14 DIAGNOSIS — R5383 Other fatigue: Secondary | ICD-10-CM

## 2023-10-20 ENCOUNTER — Inpatient Hospital Stay: Admitting: Internal Medicine

## 2023-10-20 VITALS — BP 139/68 | HR 60 | Temp 97.2°F | Resp 16 | Ht 64.0 in | Wt 161.0 lb

## 2023-10-20 DIAGNOSIS — C3432 Malignant neoplasm of lower lobe, left bronchus or lung: Secondary | ICD-10-CM | POA: Diagnosis not present

## 2023-10-20 DIAGNOSIS — C3490 Malignant neoplasm of unspecified part of unspecified bronchus or lung: Secondary | ICD-10-CM | POA: Diagnosis not present

## 2023-10-20 DIAGNOSIS — R5382 Chronic fatigue, unspecified: Secondary | ICD-10-CM

## 2023-10-20 NOTE — Progress Notes (Signed)
 [facility] [phone]   [phone]) [phone]  OFFICE PROGRESS NOTE  [name] LABOR, NP [address] [place] [address]  DIAGNOSIS: stage IV (T3, N0, M1 a) non-small cell lung cancer, adenocarcinoma presented with large left lower lobe lung mass in addition to another lingual mass and malignant left pleural effusion diagnosed in September 2021  Biomarker Findings Tumor Mutational Burden - 13 Muts/Mb Microsatellite status - MS-Stable Genomic Findings For a complete list of the genes assayed, please refer to the Appendix. KRAS G12C PRDM1 D265fs*46 TERT promoter -146C>T TP53 V157F 7 Disease relevant genes with no reportable alterations: ALK, BRAF, EGFR, ERBB2, MET, RET, ROS1   PDL1 Expression: 95%  PRIOR THERAPY:  1) Palliative radiotherapy to the enlarging right axillary lymphadenopathy under the care of Dr. Dewey. 2) Libtayo  (Cempilimab) 350 mg IV every 3 weeks.  First dose January 15, 2020.  Status post 35 cycles.  CURRENT THERAPY: Observation.  [name] 68 y.o. female returns to the clinic today for follow-up visit accompanied by her husband.Discussed the use of AI scribe software for clinical note transcription with the patient, who gave verbal consent to proceed.  History of Present Illness Gabrielle [name] is a 68 year old female with stage four non-small cell lung cancer who presents for evaluation with repeat CT scan for restaging of her disease. She is accompanied by her husband.  Diagnosed with stage four non-small cell lung cancer, adenocarcinoma subtype, in September 2021, she has a PD-L1 expression of 95% and a positive KRAS G12C mutation. She was treated with Libtayo  as a single agent every three weeks for a total of 35 cycles, with the last dose administered in October 2023. Since then, she has been on observation.  Approximately one month after her last visit, she developed oral lesions  that have not resolved. She has not consulted a dentist regarding this issue.  She describes experiencing transient ischemic attack-like episodes, characterized by slurred speech that resolves quickly. She does not take baby aspirin daily and has not sought immediate medical attention during these episodes.  A CT scan of the chest, abdomen, and pelvis was performed ten days ago, but the results have not yet been read. She has been off treatment for almost two years and is awaiting further evaluation based on the scan results.  No chest pain, shortness of breath, or cough.     MEDICAL HISTORY: Past Medical History:  Diagnosis Date   Arthritis    Bilateral   Hyperlipidemia    Hypertension    Pleural effusion    Pneumonia    Stage IV adenocarcinoma of lung, left (HCC)     ALLERGIES:  is allergic to ace inhibitors.  MEDICATIONS:  Current Outpatient Medications  Medication Sig Dispense Refill   cholecalciferol (VITAMIN D3) 25 MCG (1000 UNIT) tablet Take 2,000 Units by mouth daily.     levothyroxine  (SYNTHROID ) 50 MCG tablet TAKE 1 TABLET BY MOUTH EVERY DAY BEFORE BREAKFAST 90 tablet 1   sertraline (ZOLOFT) 50 MG tablet Take 75 mg by mouth daily.     No current facility-administered medications for this visit.    SURGICAL HISTORY:  Past Surgical History:  Procedure Laterality Date   NEPHRECTOMY Left     REVIEW OF SYSTEMS:  Constitutional: positive for fatigue Eyes: negative Ears, nose, mouth, throat, and face: positive for sore mouth Respiratory: negative Cardiovascular: negative Gastrointestinal: negative Genitourinary:negative Integument/breast: negative Hematologic/lymphatic: negative Musculoskeletal:positive for  arthralgias Neurological: negative Behavioral/Psych: negative Endocrine: negative Allergic/Immunologic: negative   PHYSICAL EXAMINATION: General appearance: alert, cooperative, and no distress Head: Normocephalic, without obvious abnormality,  atraumatic Neck: no adenopathy, no JVD, supple, symmetrical, trachea midline, and thyroid  not enlarged, symmetric, no tenderness/mass/nodules Lymph nodes: Cervical, supraclavicular, and axillary nodes normal. Resp: clear to auscultation bilaterally Back: symmetric, no curvature. ROM normal. No CVA tenderness. Cardio: regular rate and rhythm, S1, S2 normal, no murmur, click, rub or gallop GI: soft, non-tender; bowel sounds normal; no masses,  no organomegaly Extremities: extremities normal, atraumatic, no cyanosis or edema Neurologic: Alert and oriented X 3, normal strength and tone. Normal symmetric reflexes. Normal coordination and gait  ECOG PERFORMANCE STATUS: 1 - Symptomatic but completely ambulatory  Blood pressure 139/68, pulse 60, temperature (!) 97.2 F (36.2 C), temperature source Temporal, resp. rate 16, height 5' 4 (1.626 m), weight 161 lb (73 kg), SpO2 99%.  LABORATORY DATA: Lab Results  Component Value Date   WBC 5.5 10/11/2023   HGB 12.4 10/11/2023   HCT 39.0 10/11/2023   MCV 94.0 10/11/2023   PLT 190 10/11/2023      Chemistry      Component Value Date/Time   NA 139 10/11/2023 0857   K 5.4 (Gabrielle) 10/11/2023 0857   CL 106 10/11/2023 0857   CO2 28 10/11/2023 0857   BUN 34 (Gabrielle) 10/11/2023 0857   CREATININE 1.31 (Gabrielle) 10/11/2023 0857      Component Value Date/Time   CALCIUM 9.8 10/11/2023 0857   ALKPHOS 61 10/11/2023 0857   AST 21 10/11/2023 0857   ALT 27 10/11/2023 0857   BILITOT 0.4 10/11/2023 0857       RADIOGRAPHIC STUDIES: No results found.    ASSESSMENT AND PLAN: This is a very pleasant 68 years old white female recently diagnosed with stage IV (T3, N0, M1 a) non-small cell lung cancer, adenocarcinoma presented with large left lower lobe lung mass in addition to another lingular mass and malignant left pleural effusion diagnosed in September 2021.  The molecular studies showed positive PD-L1 expression of 95%.  The patient also has K-ras G12C mutation  which will be an option for the second line treatment. With the high PD-L1 expression I discussed with the patient treatment with single agent Libtayo  (Cempilimab) 350 mg IV every 3 weeks for up to 2 years unless the patient has evidence for disease progression or unacceptable toxicity. The patient started the first cycle of this treatment on January 15, 2020. She is status post 35 cycles.  She completed 2 years of treatment with immunotherapy. For the enlarging right axillary lymph node, the patient underwent palliative radiotherapy to this area. The patient has been in observation for more than 2 years now.  She had repeat CT scan of the chest, abdomen and pelvis performed recently.  Unfortunately the final report is still pending but I personally and independently reviewed the scan images and I can see some mild increase in some of the pulmonary nodules but I will wait for the final report for confirmation. Assessment and Plan Assessment & Plan Stage IV non-small cell lung adenocarcinoma with KRAS G12C mutation Stage IV non-small cell lung adenocarcinoma with KRAS G12C mutation, initially diagnosed in September 2021. Previously treated with Libtayo  for 35 cycles, last dose in October 2023, currently under observation. Recent CT scan of chest, abdomen, and pelvis shows some nodules in the lung that may be denser and slightly larger, but official radiology report is pending. She expressed concern about immune therapy due  to media reports but acknowledged its previous efficacy. - Await official radiology report of recent CT scan. - If CT scan shows progression, discuss treatment options including resuming immune therapy or starting oral therapy targeting KRAS G12C mutation. - If CT scan is stable, schedule follow-up in four months with repeat scan.  Episodes of transient slurred speech (possible transient ischemic attacks) Reports episodes of transient slurred speech, suggestive of possible transient  ischemic attacks (TIAs). Episodes are brief and resolve quickly. No current use of daily aspirin. - Advise daily use of baby aspirin. - Instruct to seek immediate medical attention if episodes of slurred speech occur again. She was advised to call immediately if she has any concerning symptoms in the interval.  All questions were answered. The patient knows to call the clinic with any problems, questions or concerns. We can certainly see the patient much sooner if necessary. The total time spent in the appointment was 30 minutes.   Disclaimer: This note was dictated with voice recognition software. Similar sounding words can inadvertently be transcribed and may not be corrected upon review.

## 2023-10-21 ENCOUNTER — Telehealth: Payer: Self-pay | Admitting: Internal Medicine

## 2023-10-21 NOTE — Telephone Encounter (Signed)
 Scheduled appointments around CT scan expected date. Left the patient a voicemail regarding the days and times of the appointments.

## 2023-10-22 ENCOUNTER — Other Ambulatory Visit: Payer: Self-pay

## 2023-10-27 ENCOUNTER — Telehealth: Payer: Self-pay | Admitting: Physician Assistant

## 2023-10-27 ENCOUNTER — Telehealth: Payer: Self-pay | Admitting: *Deleted

## 2023-10-27 ENCOUNTER — Other Ambulatory Visit: Payer: Self-pay | Admitting: Physician Assistant

## 2023-10-27 ENCOUNTER — Inpatient Hospital Stay
Admission: RE | Admit: 2023-10-27 | Discharge: 2023-10-27 | Disposition: A | Discharge status: Home / Self Care | Payer: Self-pay | Source: Ambulatory Visit | Attending: Physician Assistant | Admitting: Physician Assistant

## 2023-10-27 ENCOUNTER — Other Ambulatory Visit: Payer: Self-pay | Admitting: *Deleted

## 2023-10-27 DIAGNOSIS — C7931 Secondary malignant neoplasm of brain: Secondary | ICD-10-CM

## 2023-10-27 DIAGNOSIS — C3492 Malignant neoplasm of unspecified part of left bronchus or lung: Secondary | ICD-10-CM

## 2023-10-27 MED ORDER — DEXAMETHASONE 4 MG PO TABS: 4.0000 mg | ORAL_TABLET | Freq: Three times a day (TID) | ORAL | 1 refills | Status: DC

## 2023-10-27 NOTE — Telephone Encounter (Signed)
 Patient demographic sheet faxed to [facility], [phone].  Requested that images from MRI brain on 10/26/23 be pushed to [facility].  Fax confirmation received.

## 2023-10-27 NOTE — Telephone Encounter (Signed)
 The patient was seen by her PCP recently and had a brain MRI performed for the chief complaint of slurred speech.  The results were faxed to her office today.  The scan shows a 12 x 14 x 16 mm left mid to posterior frontal brain mass with some surrounding vasogenic edema.  I called the patient to make her aware the results.  I also started her on 4 mg of Decadron  3 times daily per Dr. Sherrod until she can see radiation oncology.  We discussed adverse side effects of Decadron .  The patient does have diabetes and we will call her PCPs office for any additional recommendations regarding blood sugar management.  I also placed a referral to radiation oncology for consideration of SRS.  We will also call [facility] health and have the images pushed through our radiology system.  I will also arrange for the patient's restaging CT scan to be performed in 3 months as opposed to 6 months.  Patient is in agreement with this plan.

## 2023-10-29 ENCOUNTER — Ambulatory Visit
Admission: RE | Admit: 2023-10-29 | Discharge: 2023-10-29 | Disposition: A | Discharge status: Home / Self Care | Source: Ambulatory Visit | Attending: Radiation Oncology | Admitting: Radiation Oncology

## 2023-10-29 ENCOUNTER — Encounter: Payer: Self-pay | Admitting: Radiation Oncology

## 2023-10-29 ENCOUNTER — Other Ambulatory Visit: Payer: Self-pay | Admitting: Radiation Therapy

## 2023-10-29 VITALS — BP 146/70 | HR 62 | Temp 97.7°F | Resp 18 | Ht 64.0 in | Wt 160.2 lb

## 2023-10-29 DIAGNOSIS — I2510 Atherosclerotic heart disease of native coronary artery without angina pectoris: Secondary | ICD-10-CM | POA: Diagnosis not present

## 2023-10-29 DIAGNOSIS — Z79890 Hormone replacement therapy (postmenopausal): Secondary | ICD-10-CM | POA: Diagnosis not present

## 2023-10-29 DIAGNOSIS — I10 Essential (primary) hypertension: Secondary | ICD-10-CM | POA: Diagnosis not present

## 2023-10-29 DIAGNOSIS — K4020 Bilateral inguinal hernia, without obstruction or gangrene, not specified as recurrent: Secondary | ICD-10-CM | POA: Insufficient documentation

## 2023-10-29 DIAGNOSIS — E785 Hyperlipidemia, unspecified: Secondary | ICD-10-CM | POA: Insufficient documentation

## 2023-10-29 DIAGNOSIS — Z87891 Personal history of nicotine dependence: Secondary | ICD-10-CM | POA: Diagnosis not present

## 2023-10-29 DIAGNOSIS — I700 Atherosclerosis of aorta: Secondary | ICD-10-CM | POA: Diagnosis not present

## 2023-10-29 DIAGNOSIS — K8689 Other specified diseases of pancreas: Secondary | ICD-10-CM | POA: Insufficient documentation

## 2023-10-29 DIAGNOSIS — K5730 Diverticulosis of large intestine without perforation or abscess without bleeding: Secondary | ICD-10-CM | POA: Diagnosis not present

## 2023-10-29 DIAGNOSIS — M129 Arthropathy, unspecified: Secondary | ICD-10-CM | POA: Insufficient documentation

## 2023-10-29 DIAGNOSIS — C7931 Secondary malignant neoplasm of brain: Secondary | ICD-10-CM | POA: Insufficient documentation

## 2023-10-29 DIAGNOSIS — J90 Pleural effusion, not elsewhere classified: Secondary | ICD-10-CM | POA: Insufficient documentation

## 2023-10-29 DIAGNOSIS — C3432 Malignant neoplasm of lower lobe, left bronchus or lung: Secondary | ICD-10-CM | POA: Insufficient documentation

## 2023-10-29 DIAGNOSIS — Z905 Acquired absence of kidney: Secondary | ICD-10-CM | POA: Insufficient documentation

## 2023-10-29 DIAGNOSIS — Z923 Personal history of irradiation: Secondary | ICD-10-CM | POA: Diagnosis not present

## 2023-10-29 DIAGNOSIS — K449 Diaphragmatic hernia without obstruction or gangrene: Secondary | ICD-10-CM | POA: Diagnosis not present

## 2023-10-29 DIAGNOSIS — R911 Solitary pulmonary nodule: Secondary | ICD-10-CM | POA: Insufficient documentation

## 2023-10-29 DIAGNOSIS — Z7952 Long term (current) use of systemic steroids: Secondary | ICD-10-CM | POA: Diagnosis not present

## 2023-10-29 DIAGNOSIS — Z79899 Other long term (current) drug therapy: Secondary | ICD-10-CM | POA: Insufficient documentation

## 2023-10-29 NOTE — Progress Notes (Signed)
 Location/Histology of Brain Tumor: Left frontal   Patient presented to her PCP with complaints of slurred speech.  MRI brain was obtained and showed a 12 x 14 x 16 mm left mid to posterior frontal brain mass with some surrounding vasogenic edema.     Past or anticipated interventions, if any, per neurosurgery:   Past or anticipated interventions, if any, per medical oncology:   Dose of Decadron , if applicable: 4 mg TID  Recent neurologic symptoms, if any:  Seizures: No Headaches: She reports some headache today. Nausea: None Dizziness/ataxia: She reports dizziness. Difficulty with hand coordination: No changes. Focal numbness/weakness: None Visual deficits/changes: None Confusion/Memory deficits: She reports some confusion and memory issues. Speech: She reports some speech interruptions.  Speech is clear today.  She notes that she has not had any episodes of slurred speech since starting the steroids.  SAFETY ISSUES: Prior radiation? Right Chest/Axilla 2023 Pacemaker/ICD? No Possible current pregnancy? Postmenopausal Is the patient on methotrexate? No   Additional Complaints / other details:

## 2023-10-29 NOTE — Progress Notes (Addendum)
 Radiation Oncology         [phone] ________________________________  Name: Gabrielle [name]        MRN: [id number]  Date of Service: 10/29/2023 DOB: 07-28-1955  CC:Moon, Gabrielle DELENA, NP  Sherrod Sherrod, MD     REFERRING PHYSICIAN: Sherrod Sherrod, MD  ------------------------------------------------------------------------------------------------------------------------------------------- Addendum Please see the note from Gabrielle Due, PA-C from today's visit for more details of today's encounter.  I have personally performed a face to face diagnostic evaluation on this patient and devised the following assessment and plan.  The patient underwent evaluation today for her diagnosis of stage IV non-small cell lung cancer.  The patient states that overall she has done well.  However, she has recently been found to have a solitary brain metastasis measuring 1.6 cm.  She is a good candidate for radiosurgery and this was discussed with the patient.  We discussed the rationale of treatment as well as side effects and risks.  All of her questions were answered.  She agrees to proceed with simulation in the near future for treatment planning.  Staging-stage IV non-small cell lung cancer with brain metastasis   [name], MD, PhD -------------------------------------------------------------------------------------------------------------------------------------------    DIAGNOSIS: The encounter diagnosis was Secondary malignant neoplasm of brain [facility]).    HISTORY OF PRESENT ILLNESS: Gabrielle [name] is a 68 y.o. female seen at the request of Dr. Sherrod for a new brain metastasis.   She was originally diagnosed with Stage IV [id number], NSCLC, adenocarcinoma of the LLL in September 2021.  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 In July 2022 she developed right axillary adenopathy which continued to enlarge and eventually underwent biopsy on 05/22/2021 that confirmed  metastatic adenocarcinoma.  She was then treated with palliative radiation from 06/30/2021 to 07/11/2021.  She continued on Libtayo  50 mg IV every 3 weeks and completed her final cycle on 12/30/2021.  She has remained under observation with Dr. Sherrod since then.  Most recently, the patient presented to Dr. Sherrod on 10/20/2023 to review restaging imaging. CT of the chest abdomen and pelvis on 10/11/2023 demonstrated no evidence to suggest recurrent tumor; slight interval enlargement of a few small pulmonary nodules; stable mediastinal and left subclavicular lymph nodes; no findings for abdomen/pelvic metastatic disease.  She complained of experiencing transient ischemic attack like episodes, characterized by slurred speech that resolved quickly. Dr. Sherrod had recommended the patient seek immediate medical attention if episodes of slurred speech occurred again.    The patient presented to her PCP in the interim with similar complaints.  Subsequent MRI of the brain on 10/26/2023 demonstrated a 12 x 14 x 16 mm left mid to posterior frontal lobe mass with surrounding vasogenic edema.  The patient was started on Decadron  4 mg 3 times daily.  She was kindly referred to us  today to discuss radiation treatment options.  Patient notes an approximate 46-month history of intermittent episodes of slurred speech.  She states these episodes would last approximately 20 to 40 seconds once a month.  Since being on the steroid, she notes increased energy and headaches.  Prior to being on the steroid; however, she denied any headaches, blurry vision, issues with walking, weakness in her extremities, changes in her memory, or nausea.  PREVIOUS RADIATION THERAPY: Yes   Intent: Palliative Radiation Treatment Dates: 06/30/2021 through 07/11/2021 Site Technique Total Dose (Gy) Dose per Fx (Gy) Completed Fx Beam Energies  Chest, Right: Chest_R_axilla 3D 30/30 3 10/10 10X  PAST MEDICAL HISTORY:  Past Medical History:   Diagnosis Date   Arthritis    Bilateral   Hyperlipidemia    Hypertension    Pleural effusion    Pneumonia    Stage IV adenocarcinoma of lung, left (HCC)        PAST SURGICAL HISTORY: Past Surgical History:  Procedure Laterality Date   NEPHRECTOMY Left      FAMILY HISTORY:  Family History  Problem Relation Age of Onset   COPD Mother      SOCIAL HISTORY:  reports that she quit smoking about 9 years ago. Her smoking use included cigarettes. She started smoking about 49 years ago. She has a 40 pack-year smoking history. She has never used smokeless tobacco. She reports current alcohol use. She reports that she does not use drugs.   ALLERGIES: Ace inhibitors   MEDICATIONS:  Current Outpatient Medications  Medication Sig Dispense Refill   cholecalciferol (VITAMIN D3) 25 MCG (1000 UNIT) tablet Take 2,000 Units by mouth daily.     dexamethasone  (DECADRON ) 4 MG tablet Take 1 tablet (4 mg total) by mouth 3 (three) times daily. 30 tablet 1   levothyroxine  (SYNTHROID ) 50 MCG tablet TAKE 1 TABLET BY MOUTH EVERY DAY BEFORE BREAKFAST 90 tablet 1   pravastatin (PRAVACHOL) 40 MG tablet      sertraline (ZOLOFT) 50 MG tablet Take 75 mg by mouth daily.     vitamin B-12 (CYANOCOBALAMIN) 500 MCG tablet      No current facility-administered medications for this encounter.     REVIEW OF SYSTEMS: Notable for that above.    PHYSICAL EXAM:  Wt Readings from Last 3 Encounters:  10/29/23 160 lb 3.2 oz (72.7 kg)  10/20/23 161 lb (73 kg)  06/17/23 161 lb (73 kg)   Temp Readings from Last 3 Encounters:  10/29/23 97.7 F (36.5 C)  10/20/23 (!) 97.2 F (36.2 C) (Temporal)  06/17/23 (!) 97.5 F (36.4 C) (Temporal)   BP Readings from Last 3 Encounters:  10/29/23 (!) 146/70  10/20/23 139/68  06/17/23 (!) 152/69   Pulse Readings from Last 3 Encounters:  10/29/23 62  10/20/23 60  06/17/23 63   Pain Assessment Pain Score: 4  Pain Loc: Head/10  In general this is a well appearing  female in no acute distress. She's alert and oriented x4 and appropriate throughout the examination. Cardiopulmonary assessment is negative for acute distress and she exhibits normal effort.     ECOG = 1  0 - Asymptomatic (Fully active, able to carry on all predisease activities without restriction)  1 - Symptomatic but completely ambulatory (Restricted in physically strenuous activity but ambulatory and able to carry out work of a light or sedentary nature. For example, light housework, office work)  2 - Symptomatic, <50% in bed during the day (Ambulatory and capable of all self care but unable to carry out any work activities. Up and about more than 50% of waking hours)  3 - Symptomatic, >50% in bed, but not bedbound (Capable of only limited self-care, confined to bed or chair 50% or more of waking hours)  4 - Bedbound (Completely disabled. Cannot carry on any self-care. Totally confined to bed or chair)  5 - Death   Raylene MM, Creech RH, Tormey DC, et al. [phone]). Toxicity and response criteria of the [facility]. Am. DOROTHA Bridges. Oncol. 5 (6): 649-55    LABORATORY DATA:  Lab Results  Component Value Date   WBC 5.5 10/11/2023   HGB  12.4 10/11/2023   HCT 39.0 10/11/2023   MCV 94.0 10/11/2023   PLT 190 10/11/2023   Lab Results  Component Value Date   NA 139 10/11/2023   K 5.4 (H) 10/11/2023   CL 106 10/11/2023   CO2 28 10/11/2023   Lab Results  Component Value Date   ALT 27 10/11/2023   AST 21 10/11/2023   ALKPHOS 61 10/11/2023   BILITOT 0.4 10/11/2023      RADIOGRAPHY: CT CHEST ABDOMEN PELVIS W CONTRAST Result Date: 10/20/2023 CLINICAL DATA:  Restaging non-small cell lung cancer. * Tracking Code: BO * EXAM: CT CHEST, ABDOMEN, AND PELVIS WITH CONTRAST TECHNIQUE: Multidetector CT imaging of the chest, abdomen and pelvis was performed following the standard protocol during bolus administration of intravenous contrast. RADIATION DOSE REDUCTION: This  exam was performed according to the departmental dose-optimization program which includes automated exposure control, adjustment of the mA and/or kV according to patient size and/or use of iterative reconstruction technique. CONTRAST:  75mL OMNIPAQUE  IOHEXOL  300 MG/ML  SOLN COMPARISON:  Multiple prior imaging studies. The most recent CT examination is 06/09/2023 FINDINGS: CT CHEST FINDINGS Cardiovascular: The heart is normal in size. No pericardial effusion. The aorta is normal in caliber. No dissection. Stable scattered atherosclerotic calcifications. Stable age advanced three-vessel coronary artery calcifications. Chest wall collateral vessels are noted on the left which appears to be [name] to left subclavian vein stenosis or occlusion with reconstitution distally. Mediastinum/Nodes: Stable anterior pericardial lymph node measuring 12 mm on image 32/2. Stable 5 mm pericardial node on the left side anteriorly on image 30/2. Stable 10 mm prevascular node image 19/2. Stable 11 mm low-attenuation subcarinal lymph node on image 27/2. Stable 10 mm left-sided subcarinal node adjacent to the esophagus on image 29/2. No new or progressive findings. Left subclavicular node on image 8/2 measures 10 mm and is stable. Adjacent 11 mm node previously measured 14 mm. Stable hiatal hernia. The esophagus is otherwise normal. The thyroid  gland is normal. Lungs/Pleura: Stable underlying emphysematous changes and pulmonary scarring. Stable post treatment changes involving the left hemithorax with loss of volume and extensive scarring/radiation changes mainly in the left upper lobe. Stable areas of moderate pleural thickening. No new or progressive findings to suggest recurrent tumor. 4 mm right upper lobe pulmonary nodule on image 35/7, increased in size since the prior CT scan. Recommend close surveillance. Subpleural nodular lesion in the left upper lobe on image number 39/7 is also slightly larger. It measures 10 x 7 mm and  previously measured 9 x 5 mm. Left lower lobe subpleural density measures 15 x 10 mm and previously measured 14 x 9 mm but this has the typical appearance of rounded atelectasis. Continued observation suggested. 6 mm nodular lesion in the left lower lobe is stable and appears to be part of left lower lobe scarring changes. Several small (sub 4 mm) pulmonary nodules in both lungs. Musculoskeletal: Dense breast tissue but no obvious breast masses. Scattered calcifications. No worrisome bone lesions. Remote healed right rib fractures. CT ABDOMEN PELVIS FINDINGS Hepatobiliary: Stable peripheral low-attenuation lesion in the right hepatic lobe consistent with a benign entity, likely hepatic hemangioma. No worrisome hepatic lesions or intrahepatic biliary dilatation. The gallbladder is unremarkable. No common bile duct dilatation. Pancreas: Stable pancreatic atrophy but no mass or inflammation. Spleen: Normal in size without focal abnormality. Adrenals/Urinary Tract: The adrenal glands are normal. Status post left nephrectomy. The right kidney demonstrates renal cortical scarring changes but mass or hydronephrosis. The bladder is. Stomach/Bowel: Moderate-sized hiatal  hernia. The stomach, duodenum, small bowel and colon are unremarkable. No acute inflammatory changes, mass lesions or obstructive findings. The terminal ileum and appendix are normal. Scattered colonic diverticulosis. Vascular/Lymphatic: Stable age advanced atherosclerotic disease involving the aorta and branch vessels. No aneurysm or dissection. The major venous structures are patent. No mesenteric or retroperitoneal lymphadenopathy. Small scattered lymph nodes are stable. Reproductive: The uterus and ovaries are normal. Other: Small bilateral inguinal hernias containing fat. No subcutaneous lesions. Musculoskeletal: No worrisome lytic or sclerotic bone lesions. IMPRESSION: 1. Stable post treatment changes involving the left hemithorax. No findings to  suggest recurrent tumor. 2. Slight interval enlargement of a few small pulmonary nodules as detailed above. Recommend continued surveillance. 3. Stable mediastinal and left subclavicular lymph nodes. 4. No findings for abdominal/pelvic metastatic disease. 5. Status post left nephrectomy. 6. Stable age advanced atherosclerotic disease involving the thoracic and abdominal aorta and branch vessels including the coronary arteries. 7. Stable peripheral low-attenuation lesion in the right hepatic lobe consistent with a benign entity, likely hepatic hemangioma. 8. Stable moderate-sized hiatal hernia. 9. Aortic atherosclerosis. Aortic Atherosclerosis (ICD10-I70.0). Electronically Signed   By: MYRTIS Stammer M.D.   On: 10/20/2023 10:35       IMPRESSION/PLAN: 1. Stage IV [id number], NSCLC, adenocarcinoma of the LLL now with a brain metastasis  We reviewed this patient's current work-up. She presents today with a 1.6 cm mass in the right frontal lobe, consistent with metastatic disease from her lung primary. Re-staging imaging shows no other evidence of disease progression. She is currently on 4 mg Decadron  TID. Dr. Dewey is recommending 3 fractions of stereotactic radiosurgery [facility]) to treat the visualized brain mass. Patient will undergo 3T brain MRI to help with treatment planning.  Our nurse navigator, Devere Perch, will coordinate neurosurgery consultation prior to treatment.  Today, I talked to the patient and family about the findings and work-up thus far.  We discussed the natural history of brain metastases and general treatment, highlighting the role of radiotherapy in the management.  We discussed the available radiation techniques, and focused on the details of logistics and delivery.  We reviewed the anticipated acute and late sequelae associated with radiation in this setting.  The patient was encouraged to ask questions that I answered to the best of my ability. A patient consent form was discussed and  signed.  We retained a copy for our records.  The patient would like to proceed with radiation and will be scheduled for 3T MRI and CT simulation.   We look forward to participating in this patient's care.    In a visit lasting 40 minutes, greater than 50% of the time was spent face to face discussing the patient's condition, in preparation for the discussion, and coordinating the patient's care.   The above documentation reflects my direct findings during this shared patient visit. Please see the separate note by Dr. Dewey on this date for the remainder of the patient's plan of care.    Leeroy Due, PA-C    **Disclaimer: This note was dictated with voice recognition software. Similar sounding words can inadvertently be transcribed and this note may contain transcription errors which may not have been corrected upon publication of note.**

## 2023-10-30 NOTE — Addendum Note (Signed)
 Encounter addended by: Dewey Rush, MD on: 10/30/2023 12:14 PM  Actions taken: Clinical Note Signed

## 2023-11-01 ENCOUNTER — Telehealth: Payer: Self-pay | Admitting: Radiation Therapy

## 2023-11-01 NOTE — Telephone Encounter (Signed)
 Called to introduce myself and share the upcoming radiation treatment planning appointments we have scheduled for her SRS brain course. Pt has my contact information and was encouraged to call with any questions she may have along the way.   Devere Perch R.T(R)(T) Radiation Special Procedures Lead

## 2023-11-02 ENCOUNTER — Telehealth: Payer: Self-pay | Admitting: Internal Medicine

## 2023-11-02 NOTE — Telephone Encounter (Signed)
 Rescheduled appointments for October. Spoke with the patient and she is aware of new appointment days and times

## 2023-11-03 ENCOUNTER — Telehealth: Payer: Self-pay | Admitting: Medical Oncology

## 2023-11-03 NOTE — Telephone Encounter (Signed)
 LVM to return my call about getting her a copy of her MRI after it is completed.

## 2023-11-09 ENCOUNTER — Encounter: Payer: Self-pay | Admitting: Internal Medicine

## 2023-11-09 ENCOUNTER — Ambulatory Visit
Admission: RE | Admit: 2023-11-09 | Discharge: 2023-11-09 | Disposition: A | Discharge status: Home / Self Care | Source: Ambulatory Visit | Attending: Radiation Oncology | Admitting: Radiation Oncology

## 2023-11-09 ENCOUNTER — Telehealth: Payer: Self-pay | Admitting: Radiation Therapy

## 2023-11-09 ENCOUNTER — Other Ambulatory Visit: Payer: Self-pay | Admitting: Radiation Oncology

## 2023-11-09 DIAGNOSIS — C7931 Secondary malignant neoplasm of brain: Secondary | ICD-10-CM

## 2023-11-09 MED ORDER — GADOPICLENOL 0.5 MMOL/ML IV SOLN
7.5000 mL | Freq: Once | INTRAVENOUS | Status: AC | PRN
  Administered 2023-11-09: 7.5 mL via INTRAVENOUS

## 2023-11-09 NOTE — Telephone Encounter (Signed)
 I left a message for the patient, requesting a call back to set up a lab appointment. She is scheduled for simulation on 9/4, but needs updated BUN and Creatinine prior to us  giving contrast during that procedure. Donald Husband, PA-C has entered in the lab orders, hoping for a call back to schedule for Wed 9/3.  Devere Perch R.T.(R)(T) Radiation Special Procedures Lead

## 2023-11-09 NOTE — Progress Notes (Incomplete)
 Has armband been applied?  {yes no:314532}  Does patient have an allergy to IV contrast dye?: {yes no:314532}   Has patient ever received premedication for IV contrast dye?: {yes no:314532}   Does patient take metformin?: {yes no:314532}  If patient does take metformin when was the last dose: {Time; dates multiple:15870}  Date of lab work: {Time; dates multiple:15870} BUN: *** CR: *** eGfr: ***  IV site: {iv locations:314275}  Has IV site been added to flowsheet?  {yes no:314532}  There were no vitals taken for this visit.

## 2023-11-10 ENCOUNTER — Telehealth: Payer: Self-pay | Admitting: Radiation Therapy

## 2023-11-10 ENCOUNTER — Other Ambulatory Visit: Payer: Self-pay | Admitting: Radiation Therapy

## 2023-11-10 ENCOUNTER — Encounter: Payer: Self-pay | Admitting: Internal Medicine

## 2023-11-10 DIAGNOSIS — D329 Benign neoplasm of meninges, unspecified: Secondary | ICD-10-CM

## 2023-11-10 NOTE — Telephone Encounter (Signed)
 Error

## 2023-11-10 NOTE — Telephone Encounter (Signed)
 After more digging into her chart, we discovered a scan from 2021 that also demonstrated the brain lesion in question. This enhancement has changed very little since then and there are no additional areas concerning on the radiation treatment planning MRI completed 11/09/23.    Based on this new information, a meningioma is favored over metastatic disease in the brain. We will plan on another MRI in 4-6 months instead of treating at this time. I spoke with Ms. Weinrich to update her, she was very happy with the news.   Tomorrow's simulation appointment, the consult with Dr. Debby, and SRT visits have all been cancelled. I will call Ms. Isenhower back with the future MRI appointment details.   Devere Perch R.T(R)(T) Radiation Special Procedures Lead

## 2023-11-10 NOTE — Telephone Encounter (Signed)
 Left another message for Gabrielle [name] requesting a call back to set up an appointment for labs to be drawn prior to her [facility] tomorrow.   Devere Perch R.T(R)(T) Radiation Special Procedures Lead

## 2023-11-11 ENCOUNTER — Ambulatory Visit: Admitting: Radiation Oncology

## 2023-11-11 ENCOUNTER — Other Ambulatory Visit: Payer: Self-pay

## 2023-11-11 ENCOUNTER — Ambulatory Visit

## 2023-11-17 ENCOUNTER — Ambulatory Visit: Admitting: Radiation Oncology

## 2023-11-18 ENCOUNTER — Encounter: Payer: Self-pay | Admitting: Internal Medicine

## 2023-11-19 ENCOUNTER — Ambulatory Visit: Admitting: Radiation Oncology

## 2023-11-22 ENCOUNTER — Ambulatory Visit: Admitting: Radiation Oncology

## 2023-12-06 ENCOUNTER — Encounter: Payer: Self-pay | Admitting: Radiation Oncology

## 2023-12-19 ENCOUNTER — Other Ambulatory Visit: Payer: Self-pay

## 2023-12-27 ENCOUNTER — Telehealth: Payer: Self-pay | Admitting: *Deleted

## 2023-12-27 ENCOUNTER — Inpatient Hospital Stay: Admission: RE | Admit: 2023-12-27 | Source: Ambulatory Visit

## 2023-12-27 NOTE — Telephone Encounter (Signed)
 Spoke with pt. to confirm Radiation appt. cancelled for today and verified future appts. that are scheduled.

## 2024-01-04 ENCOUNTER — Other Ambulatory Visit: Payer: Self-pay | Admitting: *Deleted

## 2024-01-04 DIAGNOSIS — C3492 Malignant neoplasm of unspecified part of left bronchus or lung: Secondary | ICD-10-CM

## 2024-01-04 DIAGNOSIS — C7931 Secondary malignant neoplasm of brain: Secondary | ICD-10-CM

## 2024-01-05 ENCOUNTER — Ambulatory Visit (HOSPITAL_COMMUNITY)
Admission: RE | Admit: 2024-01-05 | Discharge: 2024-01-05 | Disposition: A | Discharge status: Home / Self Care | Source: Ambulatory Visit | Attending: Internal Medicine | Admitting: Internal Medicine

## 2024-01-05 ENCOUNTER — Inpatient Hospital Stay

## 2024-01-05 ENCOUNTER — Inpatient Hospital Stay: Attending: Internal Medicine

## 2024-01-05 DIAGNOSIS — I2510 Atherosclerotic heart disease of native coronary artery without angina pectoris: Secondary | ICD-10-CM | POA: Diagnosis not present

## 2024-01-05 DIAGNOSIS — C7931 Secondary malignant neoplasm of brain: Secondary | ICD-10-CM

## 2024-01-05 DIAGNOSIS — Z905 Acquired absence of kidney: Secondary | ICD-10-CM | POA: Diagnosis not present

## 2024-01-05 DIAGNOSIS — C3490 Malignant neoplasm of unspecified part of unspecified bronchus or lung: Secondary | ICD-10-CM | POA: Insufficient documentation

## 2024-01-05 DIAGNOSIS — C3432 Malignant neoplasm of lower lobe, left bronchus or lung: Secondary | ICD-10-CM | POA: Insufficient documentation

## 2024-01-05 DIAGNOSIS — C3492 Malignant neoplasm of unspecified part of left bronchus or lung: Secondary | ICD-10-CM

## 2024-01-05 LAB — CMP (CANCER CENTER ONLY)
ALT: 15 U/L (ref 0–44)
AST: 17 U/L (ref 15–41)
Albumin: 4 g/dL (ref 3.5–5.0)
Alkaline Phosphatase: 52 U/L (ref 38–126)
Anion gap: 5 (ref 5–15)
BUN: 20 mg/dL (ref 8–23)
CO2: 27 mmol/L (ref 22–32)
Calcium: 9.6 mg/dL (ref 8.9–10.3)
Chloride: 107 mmol/L (ref 98–111)
Creatinine: 1.21 mg/dL — ABNORMAL HIGH (ref 0.44–1.00)
GFR, Estimated: 49 mL/min — ABNORMAL LOW (ref >60)
Glucose, Bld: 130 mg/dL — ABNORMAL HIGH (ref 70–99)
Potassium: 4.5 mmol/L (ref 3.5–5.1)
Sodium: 139 mmol/L (ref 135–145)
Total Bilirubin: 0.3 mg/dL (ref 0.0–1.2)
Total Protein: 6.9 g/dL (ref 6.5–8.1)

## 2024-01-05 LAB — CBC WITH DIFFERENTIAL (CANCER CENTER ONLY)
Abs Immature Granulocytes: 0.03 K/uL (ref 0.00–0.07)
Basophils Absolute: 0 K/uL (ref 0.0–0.1)
Basophils Relative: 1 %
Eosinophils Absolute: 0.3 K/uL (ref 0.0–0.5)
Eosinophils Relative: 7 %
HCT: 35.7 % — ABNORMAL LOW (ref 36.0–46.0)
Hemoglobin: 11.6 g/dL — ABNORMAL LOW (ref 12.0–15.0)
Immature Granulocytes: 1 %
Lymphocytes Relative: 20 %
Lymphs Abs: 0.8 K/uL (ref 0.7–4.0)
MCH: 30.1 pg (ref 26.0–34.0)
MCHC: 32.5 g/dL (ref 30.0–36.0)
MCV: 92.5 fL (ref 80.0–100.0)
Monocytes Absolute: 0.3 K/uL (ref 0.1–1.0)
Monocytes Relative: 6 %
Neutro Abs: 2.7 K/uL (ref 1.7–7.7)
Neutrophils Relative %: 65 %
Platelet Count: 144 K/uL — ABNORMAL LOW (ref 150–400)
RBC: 3.86 MIL/uL — ABNORMAL LOW (ref 3.87–5.11)
RDW: 13.9 % (ref 11.5–15.5)
WBC Count: 4.1 K/uL (ref 4.0–10.5)
nRBC: 0 % (ref 0.0–0.2)

## 2024-01-05 MED ORDER — IOHEXOL 300 MG/ML  SOLN
100.0000 mL | Freq: Once | INTRAMUSCULAR | Status: AC | PRN
  Administered 2024-01-05: 100 mL via INTRAVENOUS

## 2024-01-10 ENCOUNTER — Inpatient Hospital Stay: Attending: Internal Medicine | Admitting: Internal Medicine

## 2024-01-10 VITALS — BP 132/75 | HR 70 | Temp 97.1°F | Resp 17 | Ht 64.0 in | Wt 162.0 lb

## 2024-01-10 DIAGNOSIS — C3490 Malignant neoplasm of unspecified part of unspecified bronchus or lung: Secondary | ICD-10-CM

## 2024-01-10 DIAGNOSIS — C3432 Malignant neoplasm of lower lobe, left bronchus or lung: Secondary | ICD-10-CM | POA: Diagnosis present

## 2024-01-10 DIAGNOSIS — Z7982 Long term (current) use of aspirin: Secondary | ICD-10-CM | POA: Diagnosis not present

## 2024-01-10 DIAGNOSIS — C7931 Secondary malignant neoplasm of brain: Secondary | ICD-10-CM | POA: Diagnosis not present

## 2024-01-10 DIAGNOSIS — Z79899 Other long term (current) drug therapy: Secondary | ICD-10-CM | POA: Diagnosis not present

## 2024-01-10 DIAGNOSIS — Z905 Acquired absence of kidney: Secondary | ICD-10-CM | POA: Insufficient documentation

## 2024-01-10 NOTE — Progress Notes (Signed)
 [facility] [phone]   [phone]  OFFICE PROGRESS NOTE  [name] LABOR, NP [address] [place] [address]  DIAGNOSIS: stage IV (T3, N0, M1 a) non-small cell lung cancer, adenocarcinoma presented with large left lower lobe lung mass in addition to another lingual mass and malignant left pleural effusion diagnosed in September 2021  Biomarker Findings Tumor Mutational Burden - 13 Muts/Mb Microsatellite status - MS-Stable Genomic Findings For a complete list of the genes assayed, please refer to the Appendix. KRAS G12C PRDM1 D265fs*46 TERT promoter -146C>T TP53 V157F 7 Disease relevant genes with no reportable alterations: ALK, BRAF, EGFR, ERBB2, MET, RET, ROS1   PDL1 Expression: 95%  PRIOR THERAPY:  1) Palliative radiotherapy to the enlarging right axillary lymphadenopathy under the care of Dr. Dewey. 2) Libtayo  (Cempilimab) 350 mg IV every 3 weeks.  First dose January 15, 2020.  Status post 35 cycles. 3) SRS to solitary brain metastasis.  CURRENT THERAPY: Observation.  [name] 68 y.o. female returns to the clinic today for follow-up visit accompanied by her husband.Discussed the use of AI scribe software for clinical note transcription with the patient, who gave verbal consent to proceed.  History of Present Illness Gabrielle [name] is a 68 year old female with stage four non-small cell lung cancer who presents for evaluation with repeat CT scan for restaging of her disease. She is accompanied by her husband.  Diagnosed with stage four non-small cell lung cancer, adenocarcinoma, in September 2021, characterized by a positive KRAS G12C mutation and a PD-L1 expression of 90%. Treated with single-agent immunotherapy using Libtayo  350 mg IV every three weeks for two years and currently under observation.  Experiences shortness of breath when lifting heavy objects or engaging in extensive  walking. No chest pain or hemoptysis. A recent MRI of the brain showed a very small area of concern, which is being monitored.  Expresses concern about 'many strokes' and believes she needs to see a cardiologist. Currently taking aspirin but does not have a cardiologist yet.    MEDICAL HISTORY: Past Medical History:  Diagnosis Date   Arthritis    Bilateral   Hyperlipidemia    Hypertension    Pleural effusion    Pneumonia    Stage IV adenocarcinoma of lung, left (HCC)     ALLERGIES:  is allergic to ace inhibitors.  MEDICATIONS:  Current Outpatient Medications  Medication Sig Dispense Refill   cholecalciferol (VITAMIN D3) 25 MCG (1000 UNIT) tablet Take 2,000 Units by mouth daily.     levothyroxine  (SYNTHROID ) 50 MCG tablet TAKE 1 TABLET BY MOUTH EVERY DAY BEFORE BREAKFAST 90 tablet 1   pravastatin (PRAVACHOL) 40 MG tablet      sertraline (ZOLOFT) 50 MG tablet Take 75 mg by mouth daily.     vitamin B-12 (CYANOCOBALAMIN) 500 MCG tablet      dexamethasone  (DECADRON ) 4 MG tablet Take 1 tablet (4 mg total) by mouth 3 (three) times daily. 30 tablet 1   No current facility-administered medications for this visit.    SURGICAL HISTORY:  Past Surgical History:  Procedure Laterality Date   NEPHRECTOMY Left     REVIEW OF SYSTEMS:  Constitutional: negative Eyes: negative Ears, nose, mouth, throat, and face: negative Respiratory: positive for dyspnea on exertion Cardiovascular: negative Gastrointestinal: negative Genitourinary:negative Integument/breast: negative Hematologic/lymphatic: negative Musculoskeletal:negative Neurological: negative Behavioral/Psych: negative Endocrine: negative Allergic/Immunologic: negative   PHYSICAL EXAMINATION: General appearance: alert, cooperative, and  no distress Head: Normocephalic, without obvious abnormality, atraumatic Neck: no adenopathy, no JVD, supple, symmetrical, trachea midline, and thyroid  not enlarged, symmetric, no  tenderness/mass/nodules Lymph nodes: Cervical, supraclavicular, and axillary nodes normal. Resp: clear to auscultation bilaterally Back: symmetric, no curvature. ROM normal. No CVA tenderness. Cardio: regular rate and rhythm, S1, S2 normal, no murmur, click, rub or gallop GI: soft, non-tender; bowel sounds normal; no masses,  no organomegaly Extremities: extremities normal, atraumatic, no cyanosis or edema Neurologic: Alert and oriented X 3, normal strength and tone. Normal symmetric reflexes. Normal coordination and gait  ECOG PERFORMANCE STATUS: 1 - Symptomatic but completely ambulatory  Blood pressure 132/75, pulse 70, temperature (!) 97.1 F (36.2 C), temperature source Temporal, resp. rate 17, height 5' 4 (1.626 m), weight 162 lb (73.5 kg), SpO2 100%.  LABORATORY DATA: Lab Results  Component Value Date   WBC 4.1 01/05/2024   HGB 11.6 (L) 01/05/2024   HCT 35.7 (L) 01/05/2024   MCV 92.5 01/05/2024   PLT 144 (L) 01/05/2024      Chemistry      Component Value Date/Time   NA 139 01/05/2024 1239   K 4.5 01/05/2024 1239   CL 107 01/05/2024 1239   CO2 27 01/05/2024 1239   BUN 20 01/05/2024 1239   CREATININE 1.21 (H) 01/05/2024 1239      Component Value Date/Time   CALCIUM 9.6 01/05/2024 1239   ALKPHOS 52 01/05/2024 1239   AST 17 01/05/2024 1239   ALT 15 01/05/2024 1239   BILITOT 0.3 01/05/2024 1239       RADIOGRAPHIC STUDIES: CT CHEST ABDOMEN PELVIS W CONTRAST Result Date: 01/07/2024 CLINICAL DATA:  Non-small-cell lung cancer restaging * Tracking Code: BO * EXAM: CT CHEST, ABDOMEN, AND PELVIS WITH CONTRAST TECHNIQUE: Multidetector CT imaging of the chest, abdomen and pelvis was performed following the standard protocol during bolus administration of intravenous contrast. RADIATION DOSE REDUCTION: This exam was performed according to the departmental dose-optimization program which includes automated exposure control, adjustment of the mA and/or kV according to patient  size and/or use of iterative reconstruction technique. CONTRAST:  OMNIPAQUE  IOHEXOL  300 MG/ML  SOLN COMPARISON:  10/11/2023 FINDINGS: CT CHEST FINDINGS Cardiovascular: Aortic atherosclerosis. Normal heart size. Three-vessel coronary artery calcifications. No pericardial effusion. Mediastinum/Nodes: Unchanged subcarinal, left hilar, and prevascular lymph nodes measuring up to 1.9 x 1.4 cm (series 2, image 19). Unchanged left superior mediastinal/supraclavicular lymph nodes measuring up to 1.5 x 1.1 cm (series 2, image 7). Small hiatal hernia. Thyroid  gland, trachea, and esophagus demonstrate no significant findings. Lungs/Pleura: Redemonstrated post treatment appearance of the left chest, with extensive scarring and volume loss of the left hemithorax. Similar area of somewhat bandlike nodular consolidation in the peripheral left upper lobe measuring 1.1 x 0.7 cm (series 6, image 38). Unchanged dense nodular consolidation in the dependent superior segment left lower lobe measuring 1.5 x 0.9 cm (series 6, image 63). Unchanged nodule adjacent to consolidation in the dependent left lower lobe measuring 0.7 cm (series 6, image 91). Slight interval increase in size of a nodule within a bandlike area of consolidation centrally in the left left upper lobe measuring 0.8 cm, previously 0.5 cm (series 6, image 54). Multiple unchanged small nodules in the right lung, for example a 0.4 cm nodule in the posterior right upper lobe (series 6, image 65). Trace, chronic loculated left pleural effusion. Musculoskeletal: No chest wall abnormality. No acute osseous findings. CT ABDOMEN PELVIS FINDINGS Hepatobiliary: No solid liver abnormality is seen. No gallstones, gallbladder wall  thickening, or biliary dilatation. Pancreas: Unremarkable. No pancreatic ductal dilatation or surrounding inflammatory changes. Spleen: Normal in size without significant abnormality. Adrenals/Urinary Tract: Adrenal glands are unremarkable. Status post  left nephrectomy. Normal, without renal calculi, solid lesion, or hydronephrosis. Bladder is unremarkable. Stomach/Bowel: Stomach is within normal limits. Appendix appears normal. No evidence of bowel wall thickening, distention, or inflammatory changes. Sigmoid diverticulosis. Vascular/Lymphatic: Aortic atherosclerosis. Unchanged prominent subcentimeter retroperitoneal lymph nodes (series 2, image 64). Reproductive: No mass or other abnormality. Other: No abdominal wall hernia or abnormality. No ascites. Musculoskeletal: No acute osseous findings. IMPRESSION: 1. Redemonstrated post treatment appearance of the left chest, with extensive scarring and volume loss of the left hemithorax. 2. Slight interval increase in size of a nodule within a bandlike area of consolidation centrally in the left upper lobe measuring 0.8 cm, previously 0.5 cm. This is concerning for recurrent or metastatic disease. Attention on follow-up. 3. Multiple additional unchanged small nodules and consolidations. Attention on follow-up. 4. Unchanged mediastinal, left hilar, and left supraclavicular lymph nodes. 5. Status post left nephrectomy. 6. Coronary artery disease. Aortic Atherosclerosis (ICD10-I70.0). Electronically Signed   By: Marolyn JONETTA Jaksch M.D.   On: 01/07/2024 07:23      ASSESSMENT AND PLAN: This is a very pleasant 68 years old white female recently diagnosed with stage IV (T3, N0, M1 a) non-small cell lung cancer, adenocarcinoma presented with large left lower lobe lung mass in addition to another lingular mass and malignant left pleural effusion diagnosed in September 2021.  The molecular studies showed positive PD-L1 expression of 95%.  The patient also has K-ras G12C mutation which will be an option for the second line treatment. With the high PD-L1 expression I discussed with the patient treatment with single agent Libtayo  (Cempilimab) 350 mg IV every 3 weeks for up to 2 years unless the patient has evidence for disease  progression or unacceptable toxicity. The patient started the first cycle of this treatment on January 15, 2020. She is status post 35 cycles.  She completed 2 years of treatment with immunotherapy. For the enlarging right axillary lymph node, the patient underwent palliative radiotherapy to this area. The patient has been in observation for more than 3 years now. She had repeat CT scan of the chest, abdomen and pelvis performed recently.  I personally independently reviewed the scan and discussed the result with the patient and her husband.  Her scan showed no concerning findings for disease progression. Assessment and Plan Assessment & Plan Stage IV non-small cell lung cancer, adenocarcinoma, KRAS G12C+ Stage IV non-small cell lung cancer, adenocarcinoma, with KRAS G12C mutation and PD-L1 expression of 90%. Previously treated with single-agent immunotherapy (Libtayo  350 mg IV every three weeks) for two years. Currently on observation. Recent imaging shows a slightly increased hazy area, but no significant concerning findings. Disease appears well-managed. - Continue observation. - Will schedule follow-up MRI in four months to monitor disease status.  Shortness of breath on exertion Reports shortness of breath on exertion, particularly with heavy lifting or extensive walking. No associated chest pain or hemoptysis. Coronary artery disease noted on recent scan. Currently taking aspirin. No cardiologist currently involved in care. - Recommended referral to cardiologist for further evaluation of coronary artery disease. The patient was advised to call immediately if she has any other concerning symptoms in the interval.  All questions were answered. The patient knows to call the clinic with any problems, questions or concerns. We can certainly see the patient much sooner if necessary. The total time  spent in the appointment was 30 minutes.   Disclaimer: This note was dictated with voice  recognition software. Similar sounding words can inadvertently be transcribed and may not be corrected upon review.

## 2024-01-12 ENCOUNTER — Telehealth: Payer: Self-pay | Admitting: Internal Medicine

## 2024-01-12 NOTE — Telephone Encounter (Signed)
 Scheduled patient for next appointments. Called and spoke with the patient she is aware.

## 2024-01-13 ENCOUNTER — Other Ambulatory Visit: Payer: Self-pay

## 2024-02-08 ENCOUNTER — Other Ambulatory Visit

## 2024-02-14 ENCOUNTER — Other Ambulatory Visit: Payer: Self-pay

## 2024-02-14 ENCOUNTER — Encounter: Payer: Self-pay | Admitting: Internal Medicine

## 2024-02-14 ENCOUNTER — Ambulatory Visit

## 2024-02-14 DIAGNOSIS — E119 Type 2 diabetes mellitus without complications: Secondary | ICD-10-CM | POA: Insufficient documentation

## 2024-02-14 DIAGNOSIS — F32A Depression, unspecified: Secondary | ICD-10-CM | POA: Insufficient documentation

## 2024-02-14 DIAGNOSIS — I10 Essential (primary) hypertension: Secondary | ICD-10-CM | POA: Insufficient documentation

## 2024-02-14 DIAGNOSIS — M1990 Unspecified osteoarthritis, unspecified site: Secondary | ICD-10-CM | POA: Insufficient documentation

## 2024-02-14 DIAGNOSIS — E785 Hyperlipidemia, unspecified: Secondary | ICD-10-CM | POA: Insufficient documentation

## 2024-02-14 DIAGNOSIS — F419 Anxiety disorder, unspecified: Secondary | ICD-10-CM | POA: Insufficient documentation

## 2024-02-14 DIAGNOSIS — J189 Pneumonia, unspecified organism: Secondary | ICD-10-CM | POA: Insufficient documentation

## 2024-02-15 ENCOUNTER — Ambulatory Visit

## 2024-02-15 ENCOUNTER — Ambulatory Visit: Admitting: Internal Medicine

## 2024-02-15 ENCOUNTER — Other Ambulatory Visit: Payer: Self-pay

## 2024-02-15 VITALS — BP 140/84 | HR 63 | Ht 64.6 in | Wt 161.6 lb

## 2024-02-15 DIAGNOSIS — R0609 Other forms of dyspnea: Secondary | ICD-10-CM | POA: Diagnosis not present

## 2024-02-15 DIAGNOSIS — I2510 Atherosclerotic heart disease of native coronary artery without angina pectoris: Secondary | ICD-10-CM | POA: Insufficient documentation

## 2024-02-15 DIAGNOSIS — E782 Mixed hyperlipidemia: Secondary | ICD-10-CM

## 2024-02-15 DIAGNOSIS — I700 Atherosclerosis of aorta: Secondary | ICD-10-CM | POA: Insufficient documentation

## 2024-02-15 DIAGNOSIS — F411 Generalized anxiety disorder: Secondary | ICD-10-CM | POA: Insufficient documentation

## 2024-02-15 DIAGNOSIS — G459 Transient cerebral ischemic attack, unspecified: Secondary | ICD-10-CM | POA: Insufficient documentation

## 2024-02-15 DIAGNOSIS — I10 Essential (primary) hypertension: Secondary | ICD-10-CM

## 2024-02-15 MED ORDER — AMLODIPINE BESYLATE 2.5 MG PO TABS: 2.5000 mg | ORAL_TABLET | Freq: Every day | ORAL | 3 refills | Status: AC

## 2024-02-15 NOTE — Patient Instructions (Addendum)
 Medication Instructions:  Your physician has recommended you make the following change in your medication:   Start Amlodipine  2.5 mg daily  *If you need a refill on your cardiac medications before your next appointment, please call your pharmacy*   Lab Work: None ordered If you have labs (blood work) drawn today and your tests are completely normal, you will receive your results only by: MyChart Message (if you have MyChart) OR A paper copy in the mail If you have any lab test that is abnormal or we need to change your treatment, we will call you to review the results.   Testing/Procedures:   [facility] at [facility] [address] [place], [place] [address] Phone: [phone]   Please arrive 15 minutes prior to your appointment time for registration and insurance purposes.  The test will take approximately 3 to 4 hours to complete; you may bring reading material.  If someone comes with you to your appointment, they will need to remain in the main lobby due to limited space in the testing area. **If you are pregnant or breastfeeding, please notify the nuclear lab prior to your appointment**  How to prepare for your Myocardial Perfusion Test: Do not eat or drink 3 hours prior to your test, except you may have water. Do not consume products containing caffeine (regular or decaffeinated) 12 hours prior to your test. (ex: coffee, chocolate, sodas, tea). Do bring a list of your current medications with you.  If not listed below, you may take your medications as normal. Do wear comfortable clothes (no dresses or overalls) and walking shoes, tennis shoes preferred (No heels or open toe shoes are allowed). Do NOT wear cologne, perfume, aftershave, or lotions (deodorant is allowed). If these instructions are not followed, your test will have to be rescheduled.  If you cannot keep your appointment, please provide 24 hours notification to the Nuclear Lab, to avoid a  possible $50 charge to your account.     Your physician has requested that you have an echocardiogram. Echocardiography is a painless test that uses sound waves to create images of your heart. It provides your doctor with information about the size and shape of your heart and how well your heart's chambers and valves are working. This procedure takes approximately one hour. There are no restrictions for this procedure. Please do NOT wear cologne, perfume, aftershave, or lotions (deodorant is allowed). Please arrive 15 minutes prior to your appointment time.  Please note: We ask at that you not bring children with you during ultrasound (echo/ vascular) testing. Due to room size and safety concerns, children are not allowed in the ultrasound rooms during exams. Our front office staff cannot provide observation of children in our lobby area while testing is being conducted. An adult accompanying a patient to their appointment will only be allowed in the ultrasound room at the discretion of the ultrasound technician under special circumstances. We apologize for any inconvenience.    Follow-Up: At [facility], you and your health needs are our priority.  As part of our continuing mission to provide you with exceptional heart care, we have created designated Provider Care Teams.  These Care Teams include your primary Cardiologist (physician) and Advanced Practice Providers (APPs -  Physician Assistants and Nurse Practitioners) who all work together to provide you with the care you need, when you need it.  We recommend signing up for the patient portal called MyChart.  Sign up information is provided on this  After Visit Summary.  MyChart is used to connect with patients for Virtual Visits (Telemedicine).  Patients are able to view lab/test results, encounter notes, upcoming appointments, etc.  Non-urgent messages can be sent to your provider as well.   To learn more about what you can do with  MyChart, go to [email or web address].    Your next appointment:   8 week(s)  Provider:   Alean Kobus, MD   Other Instructions  Cardiac Nuclear Scan A cardiac nuclear scan is a test that is done to check the flow of blood to your heart. It is done when you are resting and when you are exercising. The test looks for problems such as: Not enough blood reaching a portion of the heart. The heart muscle not working as it should. You may need this test if you have: Heart disease. Lab results that are not normal. Had heart surgery or a balloon procedure to open up blocked arteries (angioplasty) or a small mesh tube (stent). Chest pain. Shortness of breath. Had a heart attack. In this test, a special dye (tracer) is put into your bloodstream. The tracer will travel to your heart. A camera will then take pictures of your heart to see how the tracer moves through your heart. This test is usually done at a hospital and takes 2-4 hours. Tell a doctor about: Any allergies you have. All medicines you are taking, including vitamins, herbs, eye drops, creams, and over-the-counter medicines. Any bleeding problems you have. Any surgeries you have had. Any medical conditions you have. Whether you are pregnant or may be pregnant. Any history of asthma or long-term (chronic) lung disease. Any history of heart rhythm disorders or heart valve conditions. What are the risks? Your doctor will talk with you about risks. These may include: Serious chest pain and heart attack. This is only a risk if the stress portion of the test is done. Fast or uneven heartbeats (palpitations). A feeling of warmth in your chest. This feeling usually does not last long. Allergic reaction to the tracer. Shortness of breath or trouble breathing. What happens before the test? Ask your doctor about changing or stopping your normal medicines. Follow instructions from your doctor about what you cannot eat or  drink. Remove your jewelry on the day of the test. Ask your doctor if you need to avoid nicotine or caffeine. What happens during the test? An IV tube will be inserted into one of your veins. Your doctor will give you a small amount of tracer through the IV tube. You will wait for 20-40 minutes while the tracer moves through your bloodstream. Your heart will be monitored with an electrocardiogram (ECG). You will lie down on an exam table. Pictures of your heart will be taken for about 15-20 minutes. You may also have a stress test. For this test, one of these things may be done: You will be asked to exercise on a treadmill or a stationary bike. You will be given medicines that will make your heart work harder. This is done if you are unable to exercise. When blood flow to your heart has peaked, a tracer will again be given through the IV tube. After 20-40 minutes, you will get back on the exam table. More pictures will be taken of your heart. Depending on the tracer that is used, more pictures may need to be taken 3-4 hours later. Your IV tube will be removed when the test is over. The test may vary among doctors and hospitals.  What happens after the test? Ask your doctor: Whether you can return to your normal schedule, including diet, activities, travel, and medicines. Whether you should drink more fluids. This will help to remove the tracer from your body. Ask your doctor, or the department that is doing the test: When will my results be ready? How will I get my results? What are my treatment options? What other tests do I need? What are my next steps? This information is not intended to replace advice given to you by your health care provider. Make sure you discuss any questions you have with your health care provider. Document Revised: 07/22/2021 Document Reviewed: 07/22/2021 Elsevier Patient Education  2023 Elsevier Inc.  Echocardiogram An echocardiogram is a test that uses  sound waves (ultrasound) to produce images of the heart. Images from an echocardiogram can provide important information about: Heart size and shape. The size and thickness and movement of your heart's walls. Heart muscle function and strength. Heart valve function or if you have stenosis. Stenosis is when the heart valves are too narrow. If blood is flowing backward through the heart valves (regurgitation). A tumor or infectious growth around the heart valves. Areas of heart muscle that are not working well because of poor blood flow or injury from a heart attack. Aneurysm detection. An aneurysm is a weak or damaged part of an artery wall. The wall bulges out from the normal force of blood pumping through the body. Tell a health care provider about: Any allergies you have. All medicines you are taking, including vitamins, herbs, eye drops, creams, and over-the-counter medicines. Any blood disorders you have. Any surgeries you have had. Any medical conditions you have. Whether you are pregnant or may be pregnant. What are the risks? Generally, this is a safe test. However, problems may occur, including an allergic reaction to dye (contrast) that may be used during the test. What happens before the test? No specific preparation is needed. You may eat and drink normally. What happens during the test?  You will take off your clothes from the waist up and put on a hospital gown. Electrodes or electrocardiogram (ECG)patches may be placed on your chest. The electrodes or patches are then connected to a device that monitors your heart rate and rhythm. You will lie down on a table for an ultrasound exam. A gel will be applied to your chest to help sound waves pass through your skin. A handheld device, called a transducer, will be pressed against your chest and moved over your heart. The transducer produces sound waves that travel to your heart and bounce back (or echo back) to the transducer.  These sound waves will be captured in real-time and changed into images of your heart that can be viewed on a video monitor. The images will be recorded on a computer and reviewed by your health care provider. You may be asked to change positions or hold your breath for a short time. This makes it easier to get different views or better views of your heart. In some cases, you may receive contrast through an IV in one of your veins. This can improve the quality of the pictures from your heart. The procedure may vary among health care providers and hospitals. What can I expect after the test? You may return to your normal, everyday life, including diet, activities, and medicines, unless your health care provider tells you not to do that. Follow these instructions at home: It is up to you to get  the results of your test. Ask your health care provider, or the department that is doing the test, when your results will be ready. Keep all follow-up visits. This is important. Summary An echocardiogram is a test that uses sound waves (ultrasound) to produce images of the heart. Images from an echocardiogram can provide important information about the size and shape of your heart, heart muscle function, heart valve function, and other possible heart problems. You do not need to do anything to prepare before this test. You may eat and drink normally. After the echocardiogram is completed, you may return to your normal, everyday life, unless your health care provider tells you not to do that. This information is not intended to replace advice given to you by your health care provider. Make sure you discuss any questions you have with your health care provider. Document Revised: 11/06/2020 Document Reviewed: 10/17/2019 Elsevier Patient Education  2023 Elsevier Inc.    Important Information About Sugar

## 2024-02-15 NOTE — Assessment & Plan Note (Signed)
 Suboptimal blood pressure control. Target blood pressure below 130/80 mmHg Start amlodipine  2.5 mg once daily, discussed the mechanism of action and potential side effects.

## 2024-02-15 NOTE — Assessment & Plan Note (Signed)
 Atypical shortness of breath with exertion and associated chest pressure with exertion.  Discussed the findings of atherosclerosis plaque buildup in the setting of her age, risk factors for heart disease.  Given her symptoms of dyspnea on exertion and chest pain on exertion, further evaluation with echocardiogram and Lexiscan stress as nuclear imaging were recommended. She is agreeable. - Ordered TTE - Order Lexiscan stress with nuclear imaging.  Continue aspirin 81 mg once daily Continue pravastatin 40 mg once daily.

## 2024-02-15 NOTE — Progress Notes (Signed)
 Cardiology Consultation:    Date:  02/15/2024   ID:  Gabrielle [name], Gabrielle [name] 07-09-1955, MRN [id number]  PCP:  Gabrielle Greig LABOR, NP  Cardiologist:  Gabrielle JONELLE Kobus, MD   Referring MD: Gabrielle Greig LABOR, NP   No chief complaint on file.    ASSESSMENT AND PLAN:   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 Also has history of TIA, hypertension, hyperlipidemia, intolerant to higher intensity statins [remains on pravastatin 40 mg] diabetes, CKD stage III, hypothyroidism, anemia, anxiety/depression, former smoker [quit in 2016], occasional alcohol consumption. CT chest 01/05/2024 reported three-vessel coronary artery calcification and aortic atherosclerosis. Denies any prior history of CAD, CHF, MI.  Denies any prior echocardiogram or stress test.  Referred for further evaluation of symptoms of shortness of breath and atypical chest pain in the setting of coronary atherosclerosis noted on CT chest imaging.  Problem List Items Addressed This Visit     Hypertension   Suboptimal blood pressure control. Target blood pressure below 130/80 mmHg Start amlodipine  2.5 mg once daily, discussed the mechanism of action and potential side effects.       Hyperlipidemia   Last lipid panel from 10/26/2023 with LDL 66, HDL 61, total cholesterol 857 and triglycerides 77.   Continue pravastatin 40 mg once daily. Target LDL less than 70 mg/dL. Continue management as per PCP      Exertional dyspnea   Exertional dyspnea associated with chest pressure for several months progressive. In the setting of coronary atherosclerosis and cardiac risk factors proceed with echocardiogram and  stress test as discussed below.      Relevant Orders   EKG 12-Lead (Completed)   ECHOCARDIOGRAM COMPLETE   MYOCARDIAL PERFUSION IMAGING   Cardiac Stress Test: Informed Consent Details: Physician/Practitioner Attestation; Transcribe to consent form and obtain patient signature   Coronary atherosclerosis - Primary   Atypical shortness of breath with exertion and associated chest pressure with exertion.  Discussed the findings of atherosclerosis plaque buildup in the setting of her age, risk factors for heart disease.  Given her symptoms of dyspnea on exertion and chest pain on exertion, further evaluation with echocardiogram and Lexiscan stress as nuclear imaging were recommended. She is agreeable. - Ordered TTE - Order Lexiscan stress with nuclear imaging.  Continue aspirin 81 mg once daily Continue pravastatin 40 mg once daily.        Return to clinic tentatively in 8 weeks.  History of Present Illness:    Gabrielle [name] is [name] 68 y.o. female who is being seen today for the evaluation of dyspnea on exertion and coronary and aortic atherosclerosis noted on CT chest imaging 01/07/2024 at the request of [name], Gabrielle A, NP. Follows up with oncologist Gabrielle [name] woman here for the visit today accompanied by her husband.  They live together.  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 Also has history of TIA, hypertension, hyperlipidemia, intolerant to higher intensity statins [remains on pravastatin 40  mg] diabetes, CKD stage III, hypothyroidism, anemia, anxiety/depression, former smoker [quit in 2016], occasional alcohol consumption. CT chest 01/05/2024 reported three-vessel  coronary artery calcification and aortic atherosclerosis. Denies any prior history of CAD, CHF, MI.  Mentions over the last several weeks to months she has been dealing with symptoms of shortness of breath with exertion and associated with chest pressure at times. She also has atypical neck pain on the back that radiates down the left arm that has been chronic.  Shortness of breath with exertion has been somewhat progressing over the last several months.  She has been attributing it to her pulmonary issues given prior lung cancer. Denies any significant symptoms at rest. No orthopnea, paroxysmal nocturnal dyspnea.  No palpitations or lightheadedness, dizziness or syncopal episodes.  Denies any blood in urine or stools.  Mentions blood pressures at home typically suboptimal with systolic in 140s.  Good compliance with her medications.  EKG in the clinic today shows sinus rhythm heart rate 63/min, PR interval 166 ms, QRS duration 88 ms, nonspecific ST-T changes in anteroseptal leads.  Last lipid panel reviewed from 10/26/2023 with LDL 66, HDL 61, total cholesterol 857 and triglycerides 77.  Past Medical History:  Diagnosis Date   Anxiety 02/14/2024   Aortic atherosclerosis    Arthralgia of right knee 01/23/2022   Arthritis    Bilateral   Depression 02/14/2024   Diabetes mellitus (HCC) 02/14/2024   Diarrhea 10/15/2020   Effusion of left knee 11/19/2021   Encounter for antineoplastic chemotherapy 12/30/2019   Encounter for antineoplastic immunotherapy 01/08/2020   Exertional dyspnea    Generalized anxiety disorder    Goals of care, counseling/discussion 12/30/2019   Hydronephrosis, left 03/03/2017   Hyperlipidemia    Hypertension    Itching 07/22/2020   MRSA (methicillin resistant staph aureus) culture positive 03/23/2017   Urine culture     Osteoarthritis of left knee 05/04/2021   Osteoarthritis of right knee 02/07/2022   Pleural effusion    Pneumonia    Stage IV  adenocarcinoma of lung, left (HCC)    TIA (transient ischemic attack)     Past Surgical History:  Procedure Laterality Date   NEPHRECTOMY Left     Current Medications: Current Meds  Medication Sig   aspirin EC 81 MG tablet Take 162 mg by mouth daily.   Cholecalciferol (VITAMIN D) 50 MCG (2000 UT) CAPS Take 2,000 Units by mouth daily.   Cyanocobalamin (B-12 PO) Take 1 tablet by mouth daily.   ferrous sulfate 325 (65 FE) MG tablet Take 325 mg by mouth daily with breakfast.   levothyroxine  (SYNTHROID ) 50 MCG tablet TAKE 1 TABLET BY MOUTH EVERY DAY BEFORE BREAKFAST   pravastatin (PRAVACHOL) 40 MG tablet Take 40 mg by mouth daily.   sertraline (ZOLOFT) 50 MG tablet Take 100 mg by mouth daily.   Turmeric 500 MG TABS Take 500 mg by mouth daily.     Allergies:   Ace inhibitors, Crestor [rosuvastatin], and Lipitor [atorvastatin]   Social History   Socioeconomic History   Marital status: Married    Spouse name: Not on file   Number of children: Not on file   Years of education: Not on file   Highest education level: Not on file  Occupational History   Not on file  Tobacco Use   Smoking status: Former    Current packs/day: 0.00    Average packs/day: 1 pack/day for 40.0 years (40.0 ttl pk-yrs)    Types: Cigarettes    Start date: 39  Quit date: 2016    Years since quitting: 9.9   Smokeless tobacco: Never  Substance and Sexual Activity   Alcohol use: Yes    Comment: Occasional   Drug use: Never   Sexual activity: Not on file  Other Topics Concern   Not on file  Social History Narrative   Not on file   Social Drivers of Health   Financial Resource Strain: Not on file  Food Insecurity: Not on file  Transportation Needs: Not on file  Physical Activity: Not on file  Stress: Not on file  Social Connections: Not on file     Family History: The patient's family history includes COPD in her mother; Cancer in her brother; Diabetes in an other family member; Hypertension  in her father. There is no history of Heart disease. ROS:   Please see the history of present illness.    All 14 point review of systems negative except as described per history of present illness.  EKGs/Labs/Other Studies Reviewed:    The following studies were reviewed today:   EKG:  EKG Interpretation Date/Time:  Tuesday February 15 2024 13:50:01 EST Ventricular Rate:  63 PR Interval:  166 QRS Duration:  88 QT Interval:  408 QTC Calculation: 417 R Axis:   -16  Text Interpretation: Normal sinus rhythm Low voltage QRS Cannot rule out Anterior infarct , age undetermined Abnormal ECG No previous ECGs available Confirmed by Liborio Hai reddy [phone]) on 02/15/2024 2:16:24 PM    Recent Labs: 01/05/2024: ALT 15; BUN 20; Creatinine 1.21; Hemoglobin 11.6; Platelet Count 144; Potassium 4.5; Sodium 139  Recent Lipid Panel No results found for: CHOL, TRIG, HDL, CHOLHDL, VLDL, LDLCALC, LDLDIRECT  Physical Exam:    VS:  BP (!) 162/80   Pulse 63   Ht 5' 4.6 (1.641 m)   Wt 161 lb 9.6 oz (73.3 kg)   SpO2 98%   BMI 27.23 kg/m     Wt Readings from Last 3 Encounters:  02/15/24 161 lb 9.6 oz (73.3 kg)  01/10/24 162 lb (73.5 kg)  10/29/23 160 lb 3.2 oz (72.7 kg)     GENERAL:  Well nourished, well developed in no acute distress NECK: No JVD; No carotid bruits CARDIAC: RRR, S1 and S2 present, no murmurs, no rubs, no gallops CHEST:  Clear to auscultation without rales, wheezing or rhonchi  Extremities: No pitting pedal edema. Pulses bilaterally symmetric with radial 2+ and dorsalis pedis 2+ NEUROLOGIC:  Alert and oriented x 3  Medication Adjustments/Labs and Tests Ordered: Current medicines are reviewed at length with the patient today.  Concerns regarding medicines are outlined above.  Orders Placed This Encounter  Procedures   Cardiac Stress Test: Informed Consent Details: Physician/Practitioner Attestation; Transcribe to consent form and obtain patient signature    MYOCARDIAL PERFUSION IMAGING   EKG 12-Lead   ECHOCARDIOGRAM COMPLETE   No orders of the defined types were placed in this encounter.   Signed, Keerat Denicola reddy Sheneika Walstad, MD, MPH, [facility]. 02/15/2024 2:33 PM    [facility]

## 2024-02-15 NOTE — Assessment & Plan Note (Signed)
 Last lipid panel from 10/26/2023 with LDL 66, HDL 61, total cholesterol 857 and triglycerides 77.   Continue pravastatin 40 mg once daily. Target LDL less than 70 mg/dL. Continue management as per PCP

## 2024-02-15 NOTE — Addendum Note (Signed)
 Addended by: ONEITA BERLINER on: 02/15/2024 02:42 PM   Modules accepted: Orders

## 2024-02-15 NOTE — Assessment & Plan Note (Addendum)
 Exertional dyspnea associated with chest pressure for several months progressive. In the setting of coronary atherosclerosis and cardiac risk factors proceed with echocardiogram and stress test as discussed below.

## 2024-03-14 ENCOUNTER — Telehealth: Payer: Self-pay

## 2024-03-14 NOTE — Telephone Encounter (Signed)
 Patient called the office this morning and left a voicemail requesting a return call.   Returned call and spoke with patient regarding missed call. Patient reports that around the beginning of December she noticed two lumps on the sides of her neck--one on the left and one on the right--located on the lower neck near the collarbone. She states the lumps are approximately quarter-sized. Patient denies redness, warmth, difficulty swallowing. Patient reports she was seen by her PCP on 02/28/24, and at that time the PCP was unable to palpate the lumps. Informed patient that this information will be relayed to Dr. Sherrod and that she will be contacted with further recommendations. Patient voiced understanding.

## 2024-03-15 ENCOUNTER — Encounter: Payer: Self-pay | Admitting: Internal Medicine

## 2024-03-15 NOTE — Progress Notes (Signed)
 [facility] OFFICE PROGRESS NOTE  [name] LABOR, NP [address] [place] [address]  DIAGNOSIS: stage IV (T3, N0, M1 a) non-small cell lung cancer, adenocarcinoma presented with large left lower lobe lung mass in addition to another lingual mass and malignant left pleural effusion diagnosed in September 2021   Biomarker Findings Tumor Mutational Burden - 13 Muts/Mb Microsatellite status - MS-Stable Genomic Findings For a complete list of the genes assayed, please refer to the Appendix. KRAS G12C PRDM1 D265fs*46 TERT promoter -146C>T TP53 V157F 7 Disease relevant genes with no reportable alterations: ALK, BRAF, EGFR, ERBB2, MET, RET, ROS1    PDL1 Expression: 95%  PRIOR THERAPY:  1) Palliative radiotherapy to the enlarging right axillary lymphadenopathy under the care of Dr. Dewey. 2) Libtayo  (Cempilimab) 350 mg IV every 3 weeks.  First dose January 15, 2020.  Status post 35 cycles. 3) SRS to solitary brain lesion completed in fall 2025  CURRENT THERAPY: Observation   [name] 69 y.o. female returns to the clinic today for a follow up visit accompanied by her daughter. The patient was last seen in the clinic by Dr. Sherrod on 01/10/24. She is followed for her lung cancer. She completed two years of immunotherapy and is currently on observation.   She called the clinic earlier this week.  She stated that starting in the beginning of December she noticed to lumps on the side of her neck on the left and right side.  She states they are approximately quarter in size.  She saw her PCP on 02/28/2024 and they were unable to palpate the lumps.  Therefore she is here to reevaluate this in our clinic.  One lump is described as tender and prominent, extending from the cervical region toward the arm, with associated arm soreness. The lump has remained stable in size since onset and is described as firm. A second, non-tender lump is present  inferior to the right ear, both approximately pea-sized. She denies recent upper respiratory, ear, or scalp infections, though she experienced a cold last week (after the lumps appeared).   She also notes small white lesions on her gums and plans dental evaluation. She denies dysphagia, voice changes, or hoarseness. She experiences occasional frontal headaches. She denies fever, chills, night sweats, unintentional weight loss, gastrointestinal symptoms, vision changes, rashes, or scalp lesions.  Headaches sometimes too. Frontal headaches. She denies any redness, warmth, or difficulty swallowing.  She saw her PCP on 02/28/2024 and they were unable to palpate the lumps.  Therefore she is here to reevaluate this in our clinic.  She has a previously identified brain lesion, which was first noted in 2021 and has not changed in size according to prior MRI scans. She is scheduled for follow-up brain imaging on 04/04/24. Her prior axillary lymphadenopathy, treated with radiation, remains asymptomatic.  She is here today for evaluation and repeat blood work.    MEDICAL HISTORY: Past Medical History:  Diagnosis Date   Anxiety 02/14/2024   Aortic atherosclerosis    Arthralgia of right knee 01/23/2022   Arthritis    Bilateral   Depression 02/14/2024   Diabetes mellitus (HCC) 02/14/2024   Diarrhea 10/15/2020   Effusion of left knee 11/19/2021   Encounter for antineoplastic chemotherapy 12/30/2019   Encounter for antineoplastic immunotherapy 01/08/2020   Exertional dyspnea    Generalized anxiety disorder    Goals of care, counseling/discussion 12/30/2019   Hydronephrosis, left 03/03/2017   Hyperlipidemia  Hypertension    Itching 07/22/2020   MRSA (methicillin resistant staph aureus) culture positive 03/23/2017   Urine culture     Osteoarthritis of left knee 05/04/2021   Osteoarthritis of right knee 02/07/2022   Pleural effusion    Pneumonia    Stage IV adenocarcinoma of lung, left (HCC)     TIA (transient ischemic attack)     ALLERGIES:  is allergic to ace inhibitors, crestor [rosuvastatin], and lipitor [atorvastatin].  MEDICATIONS:  Current Outpatient Medications  Medication Sig Dispense Refill   amLODipine  (NORVASC ) 2.5 MG tablet Take 1 tablet (2.5 mg total) by mouth daily. 180 tablet 3   aspirin EC 81 MG tablet Take 162 mg by mouth daily.     Cholecalciferol (VITAMIN D) 50 MCG (2000 UT) CAPS Take 2,000 Units by mouth daily.     Cyanocobalamin (B-12 PO) Take 1 tablet by mouth daily.     ferrous sulfate 325 (65 FE) MG tablet Take 325 mg by mouth daily with breakfast.     levothyroxine  (SYNTHROID ) 50 MCG tablet TAKE 1 TABLET BY MOUTH EVERY DAY BEFORE BREAKFAST 90 tablet 1   pravastatin (PRAVACHOL) 40 MG tablet Take 40 mg by mouth daily.     sertraline (ZOLOFT) 50 MG tablet Take 100 mg by mouth daily.     Turmeric 500 MG TABS Take 500 mg by mouth daily.     No current facility-administered medications for this visit.    SURGICAL HISTORY:  Past Surgical History:  Procedure Laterality Date   NEPHRECTOMY Left     REVIEW OF SYSTEMS:   Review of Systems  Constitutional: Negative for appetite change, chills, fatigue, fever and unexpected weight change.  HENT: Negative for mouth sores, nosebleeds, sore throat and trouble swallowing.   Eyes: Negative for eye problems and icterus.  Respiratory: Negative for cough, hemoptysis, shortness of breath and wheezing.   Cardiovascular: Negative for chest pain and leg swelling.  Gastrointestinal: Negative for abdominal pain, constipation, diarrhea, nausea and vomiting.  Genitourinary: Negative for bladder incontinence, difficulty urinating, dysuria, frequency and hematuria.   Musculoskeletal: Negative for back pain, gait problem, neck pain and neck stiffness.  Skin: Negative for itching and rash.  Neurological: Occasional frontal headache. Negative for dizziness, extremity weakness, gait problem, light-headedness and seizures.   Hematological: Negative for adenopathy. Does not bruise/bleed easily.  Psychiatric/Behavioral: Negative for confusion, depression and sleep disturbance. The patient is not nervous/anxious.     PHYSICAL EXAMINATION:  Blood pressure (!) 144/66, pulse 67, temperature 98 F (36.7 C), temperature source Temporal, resp. rate 16, weight 160 lb (72.6 kg), SpO2 98%.  ECOG PERFORMANCE STATUS: 1  Physical Exam  Constitutional: Oriented to person, place, and time and well-developed, well-nourished, and in no distress.  HENT:  Head: Normocephalic and atraumatic.  Mouth/Throat: Oropharynx is clear and moist. No oropharyngeal exudate.  Eyes: Conjunctivae are normal. Right eye exhibits no discharge. Left eye exhibits no discharge. No scleral icterus.  Neck: Normal range of motion. Neck supple.  Cardiovascular: Normal rate, regular rhythm, normal heart sounds and intact distal pulses.   Pulmonary/Chest: Effort normal and breath sounds normal. No respiratory distress. No wheezes. No rales.  Abdominal: Soft. Bowel sounds are normal. Exhibits no distension and no mass. There is no tenderness.  Musculoskeletal: Normal range of motion. Exhibits no edema.  Lymphadenopathy:    Pea sized lymph nodes in the left cervical region and right superficial cervical Neurological: Alert and oriented to person, place, and time. Exhibits normal muscle tone. Gait normal. Coordination normal.  Skin: Skin is warm and dry. No rash noted. Not diaphoretic. No erythema. No pallor.  Psychiatric: Mood, memory and judgment normal.  Vitals reviewed.  LABORATORY DATA: Lab Results  Component Value Date   WBC 4.1 01/05/2024   HGB 11.6 (L) 01/05/2024   HCT 35.7 (L) 01/05/2024   MCV 92.5 01/05/2024   PLT 144 (L) 01/05/2024      Chemistry      Component Value Date/Time   NA 139 01/05/2024 1239   K 4.5 01/05/2024 1239   CL 107 01/05/2024 1239   CO2 27 01/05/2024 1239   BUN 20 01/05/2024 1239   CREATININE 1.21 (H)  01/05/2024 1239      Component Value Date/Time   CALCIUM 9.6 01/05/2024 1239   ALKPHOS 52 01/05/2024 1239   AST 17 01/05/2024 1239   ALT 15 01/05/2024 1239   BILITOT 0.3 01/05/2024 1239       RADIOGRAPHIC STUDIES:  No results found.   ASSESSMENT/PLAN:  This is a very pleasant 69 year old Caucasian female diagnosed with stage IV (T3, N0, M1A) non-small cell lung cancer, adenocarcinoma.  She presented with a large left lower lobe lung mass in addition to another lingular mass and a malignant left pleural effusion.  She was diagnosed in September 2021.  Her molecular studies show that she has a PD-L1 expression at 95%.  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   She completed 2 years of immunotherapy with Libtayo  350 mg IV every 3 weeks.  She has been on observation since 2023.  The patient underwent radiation to the enlarging right axillary lymph node under the care of Dr. Dewey. 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 Dewey in August 2025.  Discussed with Dr. Sherrod.   She has been off treatment for several years. She has new left and right mildly enlarged lymph nodes. Dr. Sherrod recommends PET scan to restage her disease given suspected progression.  See her back in the clinic in approximately 2.5-3 weeks to review the results and next steps in her care based on the results.  She will keep the brain MRI scheduled for later this month and follow up with radiation oncology.   The patient was advised to call immediately if she has any concerning symptoms in the interval. The patient voices understanding of current disease status and treatment options and is in agreement with the current care plan. All questions were answered. The patient knows to call the clinic with any problems, questions or concerns. We can certainly see the patient  much sooner if necessary     Orders Placed This Encounter  Procedures   NM PET Image Restage (PS) Skull Base to Thigh (F-18 FDG)    Standing Status:   Future    Expected Date:   03/23/2024    Expiration Date:   03/16/2025    If indicated for the ordered procedure, I authorize the administration of a radiopharmaceutical per Radiology protocol:   Yes    Preferred imaging location?:   [facility]   CBC with Differential (Cancer Center Only)    Standing Status:   Future    Expected Date:   03/23/2024    Expiration Date:   03/16/2025   CMP (Cancer Center only)    Standing Status:   Future    Expected  Date:   03/23/2024    Expiration Date:   03/16/2025     The total time spent in the appointment was 20-29 minutes  Ariauna Farabee L Vlad Mayberry, PA-C 03/16/2024

## 2024-03-15 NOTE — Telephone Encounter (Signed)
 Per Dr. Sherrod, patient needs to be seen this week or early next week.  LVM with patient to return the call to get scheduled.

## 2024-03-15 NOTE — Telephone Encounter (Signed)
 Spoke with patient and scheduled patient with Cassie, PA tomorrow at 230 PM.  She voiced understanding.

## 2024-03-16 ENCOUNTER — Inpatient Hospital Stay: Attending: Internal Medicine | Admitting: Physician Assistant

## 2024-03-16 VITALS — BP 144/66 | HR 67 | Temp 98.0°F | Resp 16 | Wt 160.0 lb

## 2024-03-16 DIAGNOSIS — C3492 Malignant neoplasm of unspecified part of left bronchus or lung: Secondary | ICD-10-CM | POA: Diagnosis not present

## 2024-03-16 DIAGNOSIS — C3432 Malignant neoplasm of lower lobe, left bronchus or lung: Secondary | ICD-10-CM | POA: Diagnosis present

## 2024-03-16 DIAGNOSIS — Z79899 Other long term (current) drug therapy: Secondary | ICD-10-CM | POA: Diagnosis not present

## 2024-03-16 DIAGNOSIS — Z923 Personal history of irradiation: Secondary | ICD-10-CM | POA: Diagnosis not present

## 2024-03-16 DIAGNOSIS — C7931 Secondary malignant neoplasm of brain: Secondary | ICD-10-CM | POA: Insufficient documentation

## 2024-03-21 ENCOUNTER — Telehealth (HOSPITAL_COMMUNITY): Payer: Self-pay

## 2024-03-21 NOTE — Telephone Encounter (Signed)
 Spoke with the patient, instructions given. S.Draylon Mercadel CCT

## 2024-03-29 ENCOUNTER — Ambulatory Visit

## 2024-03-29 DIAGNOSIS — R0609 Other forms of dyspnea: Secondary | ICD-10-CM | POA: Insufficient documentation

## 2024-03-29 MED ORDER — TECHNETIUM TC 99M TETROFOSMIN IV KIT
29.7000 | PACK | Freq: Once | INTRAVENOUS | Status: AC | PRN
  Administered 2024-03-29: 29.7 via INTRAVENOUS

## 2024-03-29 MED ORDER — REGADENOSON 0.4 MG/5ML IV SOLN
0.4000 mg | Freq: Once | INTRAVENOUS | Status: AC
  Administered 2024-03-29: 0.4 mg via INTRAVENOUS

## 2024-03-29 MED ORDER — TECHNETIUM TC 99M TETROFOSMIN IV KIT
9.9000 | PACK | Freq: Once | INTRAVENOUS | Status: AC | PRN
  Administered 2024-03-29: 9.9 via INTRAVENOUS

## 2024-03-30 ENCOUNTER — Encounter (HOSPITAL_COMMUNITY)
Admission: RE | Admit: 2024-03-30 | Discharge: 2024-03-30 | Disposition: A | Discharge status: Home / Self Care | Source: Ambulatory Visit | Attending: Physician Assistant | Admitting: Physician Assistant

## 2024-03-30 DIAGNOSIS — D110 Benign neoplasm of parotid gland: Secondary | ICD-10-CM | POA: Diagnosis not present

## 2024-03-30 DIAGNOSIS — Z905 Acquired absence of kidney: Secondary | ICD-10-CM | POA: Diagnosis not present

## 2024-03-30 DIAGNOSIS — C787 Secondary malignant neoplasm of liver and intrahepatic bile duct: Secondary | ICD-10-CM | POA: Diagnosis not present

## 2024-03-30 DIAGNOSIS — C782 Secondary malignant neoplasm of pleura: Secondary | ICD-10-CM | POA: Diagnosis not present

## 2024-03-30 DIAGNOSIS — C3492 Malignant neoplasm of unspecified part of left bronchus or lung: Secondary | ICD-10-CM | POA: Diagnosis present

## 2024-03-30 DIAGNOSIS — K449 Diaphragmatic hernia without obstruction or gangrene: Secondary | ICD-10-CM | POA: Diagnosis not present

## 2024-03-30 DIAGNOSIS — C770 Secondary and unspecified malignant neoplasm of lymph nodes of head, face and neck: Secondary | ICD-10-CM | POA: Diagnosis not present

## 2024-03-30 DIAGNOSIS — R933 Abnormal findings on diagnostic imaging of other parts of digestive tract: Secondary | ICD-10-CM | POA: Diagnosis not present

## 2024-03-30 DIAGNOSIS — I2510 Atherosclerotic heart disease of native coronary artery without angina pectoris: Secondary | ICD-10-CM | POA: Insufficient documentation

## 2024-03-30 DIAGNOSIS — C7951 Secondary malignant neoplasm of bone: Secondary | ICD-10-CM | POA: Diagnosis not present

## 2024-03-30 DIAGNOSIS — C771 Secondary and unspecified malignant neoplasm of intrathoracic lymph nodes: Secondary | ICD-10-CM | POA: Diagnosis not present

## 2024-03-30 DIAGNOSIS — I700 Atherosclerosis of aorta: Secondary | ICD-10-CM | POA: Diagnosis not present

## 2024-03-30 LAB — GLUCOSE, CAPILLARY: Glucose-Capillary: 100 mg/dL — ABNORMAL HIGH (ref 70–99)

## 2024-03-30 MED ORDER — FLUDEOXYGLUCOSE F - 18 (FDG) INJECTION
7.4800 | Freq: Once | INTRAVENOUS | Status: AC
  Administered 2024-03-30: 7.48 via INTRAVENOUS

## 2024-03-31 LAB — MYOCARDIAL PERFUSION IMAGING
LV dias vol: 64 mL (ref 46–106)
LV sys vol: 17 mL
Nuc Stress EF: 73 %
Peak HR: 80 {beats}/min
Rest HR: 56 {beats}/min
Rest Nuclear Isotope Dose: 9.9 mCi
SDS: 2
SRS: 4
SSS: 6
ST Depression (mm): 0 mm
Stress Nuclear Isotope Dose: 29.7 mCi
TID: 1.07

## 2024-04-03 ENCOUNTER — Ambulatory Visit

## 2024-04-03 NOTE — Progress Notes (Unsigned)
 [facility] OFFICE PROGRESS NOTE  [name] LABOR, NP [address] [place] [address] [place] [address]  DIAGNOSIS: stage IV (T3, N0, M1 a) non-small cell lung cancer, adenocarcinoma presented with large left lower lobe lung mass in addition to another lingual mass and malignant left pleural effusion diagnosed in September 2021. She was found to have progressive disease with pleural-based masses in the left hemithorax, left supraclavicular, mediastinal, hilar, pericardial, right internal mammary, left subpectoral, axillary, and distal paraesophageal Adenopathy, hepatic mass, and osseous metastatic disease involving the left third rib and left femoral head, and left level V cervical lymph nodes in January 2026.     Biomarker Findings Tumor Mutational Burden - 13 Muts/Mb Microsatellite status - MS-Stable Genomic Findings For a complete list of the genes assayed, please refer to the Appendix. KRAS G12C PRDM1 D265fs*46 TERT promoter -146C>T TP53 V157F 7 Disease relevant genes with no reportable alterations: ALK, BRAF, EGFR, ERBB2, MET, RET, ROS1    PDL1 Expression: 95%  PRIOR THERAPY: 1) Palliative radiotherapy to the enlarging right axillary lymphadenopathy under the care of Dr. Dewey. 2) Libtayo  (Cempilimab) 350 mg IV every 3 weeks.  First dose January 15, 2020.  Status post 35 cycles. 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 First dose on 04/13/24.   [name] 69 y.o. female returns to the clinic today for follow-up visit accompanied by her husband.  The patient seen in clinic by myself on 03/16/2024.  The patient had called the clinic at that time due to concern for new cervical therefore a PET scan was ordered to restage her disease.  Unfortunately the scan did show some worsening metastatic disease.  Therefore she is here today to go over her PET scan  results and discuss management of her condition.  She denies any major changes in her health since she was last seen.  She does feel that the cervical lymphadenopathy is ***  She denies any fever, chills, night sweats, or unexplained weight loss.  She denies any new chest pain, shortness of breath, cough, or hemoptysis.  She denies any nausea, vomiting, diarrhea, or constipation.  She denies any rashes or skin changes.  At her last appointment she did have some oral thrush which has since ***.  Frontal headaches?  Brain MRI?  She is here today for evaluation and to review her scan and discuss the next steps in her care.  MEDICAL HISTORY: Past Medical History:  Diagnosis Date   Anxiety 02/14/2024   Aortic atherosclerosis    Arthralgia of right knee 01/23/2022   Arthritis    Bilateral   Depression 02/14/2024   Diabetes mellitus (HCC) 02/14/2024   Diarrhea 10/15/2020   Effusion of left knee 11/19/2021   Encounter for antineoplastic chemotherapy 12/30/2019   Encounter for antineoplastic immunotherapy 01/08/2020   Exertional dyspnea    Generalized anxiety disorder    Goals of care, counseling/discussion 12/30/2019   Hydronephrosis, left 03/03/2017   Hyperlipidemia    Hypertension    Itching 07/22/2020   MRSA (methicillin resistant staph aureus) culture positive 03/23/2017   Urine culture     Osteoarthritis of left knee 05/04/2021   Osteoarthritis of right knee 02/07/2022   Pleural effusion    Pneumonia    Stage IV adenocarcinoma of lung, left (HCC)    TIA (transient ischemic attack)     ALLERGIES:  is allergic to ace  inhibitors, crestor [rosuvastatin], and lipitor [atorvastatin].  MEDICATIONS:  Current Outpatient Medications  Medication Sig Dispense Refill   amLODipine  (NORVASC ) 2.5 MG tablet Take 1 tablet (2.5 mg total) by mouth daily. 180 tablet 3   aspirin EC 81 MG tablet Take 162 mg by mouth daily.     Cholecalciferol (VITAMIN D) 50 MCG (2000 UT) CAPS Take 2,000 Units by  mouth daily.     Cyanocobalamin  (B-12 PO) Take 1 tablet by mouth daily.     ferrous sulfate 325 (65 FE) MG tablet Take 325 mg by mouth daily with breakfast.     levothyroxine  (SYNTHROID ) 50 MCG tablet TAKE 1 TABLET BY MOUTH EVERY DAY BEFORE BREAKFAST 90 tablet 1   pravastatin (PRAVACHOL) 40 MG tablet Take 40 mg by mouth daily.     sertraline (ZOLOFT) 50 MG tablet Take 100 mg by mouth daily.     Turmeric 500 MG TABS Take 500 mg by mouth daily.     No current facility-administered medications for this visit.    SURGICAL HISTORY:  Past Surgical History:  Procedure Laterality Date   NEPHRECTOMY Left     REVIEW OF SYSTEMS:   Review of Systems  Constitutional: Negative for appetite change, chills, fatigue, fever and unexpected weight change.  HENT:   Negative for mouth sores, nosebleeds, sore throat and trouble swallowing.   Eyes: Negative for eye problems and icterus.  Respiratory: Negative for cough, hemoptysis, shortness of breath and wheezing.   Cardiovascular: Negative for chest pain and leg swelling.  Gastrointestinal: Negative for abdominal pain, constipation, diarrhea, nausea and vomiting.  Genitourinary: Negative for bladder incontinence, difficulty urinating, dysuria, frequency and hematuria.   Musculoskeletal: Negative for back pain, gait problem, neck pain and neck stiffness.  Skin: Negative for itching and rash.  Neurological: Negative for dizziness, extremity weakness, gait problem, headaches, light-headedness and seizures.  Hematological: Negative for adenopathy. Does not bruise/bleed easily.  Psychiatric/Behavioral: Negative for confusion, depression and sleep disturbance. The patient is not nervous/anxious.     PHYSICAL EXAMINATION:  There were no vitals taken for this visit.  ECOG PERFORMANCE STATUS: {CHL ONC ECOG [id number]  Physical Exam  Constitutional: Oriented to person, place, and time and well-developed, well-nourished, and in no distress. No distress.   HENT:  Head: Normocephalic and atraumatic.  Mouth/Throat: Oropharynx is clear and moist. No oropharyngeal exudate.  Eyes: Conjunctivae are normal. Right eye exhibits no discharge. Left eye exhibits no discharge. No scleral icterus.  Neck: Normal range of motion. Neck supple.  Cardiovascular: Normal rate, regular rhythm, normal heart sounds and intact distal pulses.   Pulmonary/Chest: Effort normal and breath sounds normal. No respiratory distress. No wheezes. No rales.  Abdominal: Soft. Bowel sounds are normal. Exhibits no distension and no mass. There is no tenderness.  Musculoskeletal: Normal range of motion. Exhibits no edema.  Lymphadenopathy:    No cervical adenopathy.  Neurological: Alert and oriented to person, place, and time. Exhibits normal muscle tone. Gait normal. Coordination normal.  Skin: Skin is warm and dry. No rash noted. Not diaphoretic. No erythema. No pallor.  Psychiatric: Mood, memory and judgment normal.  Vitals reviewed.  LABORATORY DATA: Lab Results  Component Value Date   WBC 4.1 01/05/2024   HGB 11.6 (L) 01/05/2024   HCT 35.7 (L) 01/05/2024   MCV 92.5 01/05/2024   PLT 144 (L) 01/05/2024      Chemistry      Component Value Date/Time   NA 139 01/05/2024 1239   K 4.5 01/05/2024 1239   CL  107 01/05/2024 1239   CO2 27 01/05/2024 1239   BUN 20 01/05/2024 1239   CREATININE 1.21 (H) 01/05/2024 1239      Component Value Date/Time   CALCIUM 9.6 01/05/2024 1239   ALKPHOS 52 01/05/2024 1239   AST 17 01/05/2024 1239   ALT 15 01/05/2024 1239   BILITOT 0.3 01/05/2024 1239       RADIOGRAPHIC STUDIES:  NM PET Image Restage (PS) Skull Base to Thigh (F-18 FDG) Result Date: 04/02/2024 EXAM: PET AND CT SKULL BASE TO MID THIGH 03/30/2024 03:33:43 PM TECHNIQUE: RADIOPHARMACEUTICAL: 7.48 mCi F-18 FDG Uptake time 60 minutes. Glucose level 100 mg/dl. Blood pool SUV 2.1. PET imaging was acquired from the base of the skull to the mid thighs. Non-contrast enhanced  computed tomography was obtained for attenuation correction and anatomic localization. COMPARISON: CT scan 01/05/2024 and PET CT from 12/22/2019. CLINICAL HISTORY: Non-small cell lung cancer (NSCLC), metastatic, assess treatment response; Restaging. Suspected recurrent disease. 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 Small left neck level V lymph nodes measuring up to 0.6 cm in diameter on image 32 series 4 with maximum SUV 8.0, compatible with malignancy. CHEST: Hypermetabolic left supraclavicular, prevascular, AP window, subcarinal, left infrahilar, pericardial, right internal mammary, left subpectoral, axillary, and distal paraesophageal adenopathy. Left supraclavicular node 1.4 cm in short axis on image 51 series 4 with maximum SUV 12.9. Subcarinal node 1.1 cm in short axis on image 70 series 4 with maximum SUV 12.3. Hypermetabolic mostly pleural based masses in the left hemithorax compatible with active malignancy. Index lesion near the cardiac apex measures 1.7 x 3.0 cm on image 75 series 4 with maximum SUV 11.2. Accentuated activity near a moderate sized hiatal hernia, maximum SUV 9.1, possibly physiologic although tumor in the hiatal hernia not excluded. Ground glass nodularity posteriorly in the right upper lobe measuring 1.0 x 0.6 cm on image 17 series 7 with maximum SUV 1.7. Thoracic aortic, coronary artery, and branch vessel atheromatous vascular calcifications. ABDOMEN AND PELVIS: Enlarging segment 4b mass approximately 3.0 cm in diameter with maximum SUV 17.5, compatible with malignancy. Left nephrectomy. Minimal hypermetabolic focus in the sigmoid colon is nonspecific and could be physiologic or due to a polyp, maximum SUV 6.8. No metabolically active intraperitoneal mass. No metabolically active lymphadenopathy. Physiologic activity within the gastrointestinal and genitourinary systems. BONES AND SOFT TISSUE: Sclerotic irregular metastatic  lesion of the left 3rd rib posterolaterally, maximum SUV 7.2, compatible with metastatic disease. Mostly lytic lesion of the left femoral head 1.7 cm in diameter on image 173 series 4 with maximum SUV 16.9. IMPRESSION: 1. Hypermetabolic mostly pleural-based masses in the left hemithorax, compatible with active malignancy. 2. Hypermetabolic left supraclavicular, mediastinal, hilar, pericardial, right internal mammary, left subpectoral, axillary, and distal paraesophageal adenopathy, compatible with malignancy. 3. Enlarging hypermetabolic segment 4b hepatic mass, compatible with malignancy. 4. Hypermetabolic osseous metastatic disease involving the left third rib and left femoral head. 5. Subcentimeter hypermetabolic left level V cervical lymph nodes, compatible with malignancy. 6. Right upper lobe ground-glass nodularity with low-level uptake, indeterminate. 7. Moderate-sized hiatal hernia with accentuated activity, indeterminate. 8. Focal hypermetabolic activity in the sigmoid colon, nonspecific, with colonoscopic evaluation recommended. 9. Stable hypermetabolic bilateral parotid lesions, favoring Warthin tumors. 10. Left nephrectomy. 11. Thoracic aortic and coronary artery atherosclerotic calcifications. Electronically signed by: Ryan Salvage MD 04/02/2024 11:47 AM EST RP Workstation: [id number]   MYOCARDIAL PERFUSION IMAGING Result Date: 03/31/2024   The study is normal. The study is low risk.  No ST deviation was noted.   Left ventricular function is normal. Nuclear stress EF: 73%. The left ventricular ejection fraction is hyperdynamic (>65%). End diastolic cavity size is normal.     ASSESSMENT/PLAN:  This is a very pleasant 69 year old Caucasian female diagnosed with stage IV (T3, N0, M1A) non-small cell lung cancer, adenocarcinoma.  She presented with a large left lower lobe lung mass in addition to another lingular mass and a malignant left pleural effusion.  She was diagnosed in September 2021.   Her molecular studies show that she has a PD-L1 expression at 95%.  She was found to have progressive metastatic disease with pleural-based masses in the left hemithorax, left supraclavicular, mediastinal, hilar, pericardial, right internal mammary, left subpectoral, axillary, and distal paraesophageal Adenopathy, hepatic mass, and osseous metastatic disease involving the left third rib and left femoral head, and left level V cervical lymph nodes in January 2026.   the patient also has a KRAS G12 C mutation which will be an option in the second line setting.    She completed 2 years of immunotherapy with Libtayo  350 mg IV every 3 weeks.  She has been on observation since 2023.   The patient underwent radiation to the enlarging right axillary lymph node under the care of Dr. Dewey. Her last day of radiation was on 07/11/21    He was found to have metastatic disease to the brain and underwent SRS under the care of Dr. Dewey in August 2025.   The patient developed new cervical lymphadenopathy in December 2025.  Therefore she had a PET scan recently.  The patient was seen with Dr. Sherrod today.  Dr. Sherrod personally and independently reviewed the scan and discussed the results with the patient today.  Unfortunately the patient's scan showed progression/widespread metastatic disease.  Dr. Sherrod discussed treatment options which includes resuming immunotherapy IV every 3 weeks, immunotherapy and chemotherapy together.the patient is interested in resuming immunotherapy.  We will arrange for her to receive her first dose next week on 04/13/2024   We will see her back in 4 weeks before undergoing cycle #2.  Palliative radiation to the bones?  ***brain MRI  The patient was advised to call immediately if she has any concerning symptoms in the interval. The patient voices understanding of current disease status and treatment options and is in agreement with the current care plan. All questions were  answered. The patient knows to call the clinic with any problems, questions or concerns. We can certainly see the patient much sooner if necessary    No orders of the defined types were placed in this encounter.    I spent {CHL ONC TIME VISIT - DTPQU:[id number]} counseling the patient face to face. The total time spent in the appointment was {CHL ONC TIME VISIT - DTPQU:[id number]}.  Karema Tocci L Santia Labate, PA-C 04/03/24

## 2024-04-04 ENCOUNTER — Other Ambulatory Visit

## 2024-04-05 ENCOUNTER — Other Ambulatory Visit: Payer: Self-pay | Admitting: Physician Assistant

## 2024-04-05 DIAGNOSIS — E039 Hypothyroidism, unspecified: Secondary | ICD-10-CM

## 2024-04-06 ENCOUNTER — Inpatient Hospital Stay

## 2024-04-06 ENCOUNTER — Inpatient Hospital Stay: Admitting: Physician Assistant

## 2024-04-06 VITALS — BP 123/62 | HR 61 | Temp 97.6°F | Resp 16 | Ht 65.0 in | Wt 161.3 lb

## 2024-04-06 DIAGNOSIS — C3492 Malignant neoplasm of unspecified part of left bronchus or lung: Secondary | ICD-10-CM

## 2024-04-06 DIAGNOSIS — Z7189 Other specified counseling: Secondary | ICD-10-CM | POA: Diagnosis not present

## 2024-04-06 DIAGNOSIS — E039 Hypothyroidism, unspecified: Secondary | ICD-10-CM

## 2024-04-06 DIAGNOSIS — C3432 Malignant neoplasm of lower lobe, left bronchus or lung: Secondary | ICD-10-CM | POA: Diagnosis not present

## 2024-04-06 LAB — CBC WITH DIFFERENTIAL (CANCER CENTER ONLY)
Abs Immature Granulocytes: 0.02 10*3/uL (ref 0.00–0.07)
Basophils Absolute: 0.1 10*3/uL (ref 0.0–0.1)
Basophils Relative: 1 %
Eosinophils Absolute: 0.3 10*3/uL (ref 0.0–0.5)
Eosinophils Relative: 6 %
HCT: 35.5 % — ABNORMAL LOW (ref 36.0–46.0)
Hemoglobin: 11.5 g/dL — ABNORMAL LOW (ref 12.0–15.0)
Immature Granulocytes: 0 %
Lymphocytes Relative: 21 %
Lymphs Abs: 1.1 10*3/uL (ref 0.7–4.0)
MCH: 28.8 pg (ref 26.0–34.0)
MCHC: 32.4 g/dL (ref 30.0–36.0)
MCV: 88.8 fL (ref 80.0–100.0)
Monocytes Absolute: 0.4 10*3/uL (ref 0.1–1.0)
Monocytes Relative: 7 %
Neutro Abs: 3.3 10*3/uL (ref 1.7–7.7)
Neutrophils Relative %: 65 %
Platelet Count: 182 10*3/uL (ref 150–400)
RBC: 4 MIL/uL (ref 3.87–5.11)
RDW: 13 % (ref 11.5–15.5)
WBC Count: 5.1 10*3/uL (ref 4.0–10.5)
nRBC: 0 % (ref 0.0–0.2)

## 2024-04-06 LAB — CMP (CANCER CENTER ONLY)
ALT: 22 U/L (ref 0–44)
AST: 22 U/L (ref 15–41)
Albumin: 4.4 g/dL (ref 3.5–5.0)
Alkaline Phosphatase: 85 U/L (ref 38–126)
Anion gap: 13 (ref 5–15)
BUN: 30 mg/dL — ABNORMAL HIGH (ref 8–23)
CO2: 20 mmol/L — ABNORMAL LOW (ref 22–32)
Calcium: 9.5 mg/dL (ref 8.9–10.3)
Chloride: 103 mmol/L (ref 98–111)
Creatinine: 1.27 mg/dL — ABNORMAL HIGH (ref 0.44–1.00)
GFR, Estimated: 46 mL/min — ABNORMAL LOW
Glucose, Bld: 172 mg/dL — ABNORMAL HIGH (ref 70–99)
Potassium: 4.2 mmol/L (ref 3.5–5.1)
Sodium: 137 mmol/L (ref 135–145)
Total Bilirubin: 0.3 mg/dL (ref 0.0–1.2)
Total Protein: 7.1 g/dL (ref 6.5–8.1)

## 2024-04-06 LAB — TSH: TSH: 1.93 u[IU]/mL (ref 0.350–4.500)

## 2024-04-06 MED ORDER — PROCHLORPERAZINE MALEATE 10 MG PO TABS: 10.0000 mg | ORAL_TABLET | Freq: Four times a day (QID) | ORAL | 2 refills | Status: AC | PRN

## 2024-04-06 MED ORDER — LIDOCAINE-PRILOCAINE 2.5-2.5 % EX CREA: 1.0000 | TOPICAL_CREAM | CUTANEOUS | 2 refills | Status: AC | PRN

## 2024-04-06 MED ORDER — FOLIC ACID 1 MG PO TABS: 1.0000 mg | ORAL_TABLET | Freq: Every day | ORAL | 2 refills | Status: AC

## 2024-04-06 MED ORDER — CYANOCOBALAMIN 1000 MCG/ML IJ SOLN
1000.0000 ug | Freq: Once | INTRAMUSCULAR | Status: AC
  Administered 2024-04-06: 1000 ug via INTRAMUSCULAR
  Filled 2024-04-06: qty 1

## 2024-04-06 NOTE — Progress Notes (Signed)
 DISCONTINUE OFF PATHWAY REGIMEN - Non-Small Cell Lung   [id number]$$[id number]$  350 mg IV D1 q21 Days:   A cycle is every 21 days:     Cemiplimab -rwlc   **Always confirm dose/schedule in your pharmacy ordering system**  PRIOR TREATMENT: Off Pathway: Cemiplimab  350 mg IV D1 q21 Days  START ON PATHWAY REGIMEN - Non-Small Cell Lung     A cycle is every 21 days:     Pembrolizumab      Pemetrexed      Carboplatin   **Always confirm dose/schedule in your pharmacy ordering system**  Patient Characteristics: Stage IV Metastatic, Nonsquamous, Molecular Analysis Completed, Molecular Alteration Present and Targeted Therapy Exhausted, OR EGFR/KRAS/HER2/NRG1/c-Met Present and Standard Chemotherapy/Immunotherapy Planned, OR No Alteration Present, Initial  Chemotherapy/Immunotherapy, PS = 0, 1, BRAF/MET/KRAS/HER2 Mutation Positive or c-Met High Overexpression Positive (EGFR Wildtype), Candidate for Immunotherapy, PD-L1 Expression Positive  ? 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 50% (TPS) Intent of Therapy: Non-Curative / Palliative Intent, Discussed with Patient

## 2024-04-06 NOTE — Patient Instructions (Signed)
 Summary:  -There are two main categories of lung cancer, they are named based on the size of the cancer cell. One is called Non-Small cell lung cancer. The other type is Small Cell Lung Cancer -The sample (biopsy) that they took of your tumor was consistent with a subtype of Non-small cell lung cancer called Adenocarcinoma. This is the most common type of lung cancer.  -We covered a lot of important information at your appointment today regarding what the treatment plan is moving forward. Here are the the main points that were discussed at your office visit with us  today:  -The treatment that you will receive consists of two chemotherapy drugs, called Carboplatin and Alimta (also called Pemetrexed) and one immunotherapy drug called Keytruda (pembrolizumab).  -We are planning on starting your treatment next week on 04/13/24 but before your start your treatment, I would like you to attend a Chemotherapy Education Class. This involves having you sit down with one of our nurse educators. She will discuss with your one-on-one more details about your treatment as well as general information about resources here at the cancer center.  -Your treatment will be given once every 3 weeks. We will check your labs once a week for the first ~5 treatments just to make sure that important components of your blood are in an acceptable range -We will get a CT scan after 3 treatments to check on the progress of treatment  Medications:  -I have sent a few important medication prescriptions to your pharmacy.  -Compazine  was sent to your pharmacy. This medication is for nausea. You may take this every 6 hours as needed if you feel nauseous.  -I have also sent a prescription for 1 mg of folic acid  to your pharmacy. We need you to take 1 tablet every day.  -We will administer vitamin B12 every 9 weeks while you are here in the clinic. You have received your first dose today.   Referrals or Imaging: -I will arrange for brain  MRI  Follow up:  -We will see you back for a follow up visit 1 week after your first treatment to see how it went and help manage any side effects of treatment that you may have   -If you need to reach us  at any time, the main office number to the cancer center is [phone], when you call, ask to speak to either Cassie's or Dr. Jeannett nurse.

## 2024-04-07 ENCOUNTER — Other Ambulatory Visit: Payer: Self-pay

## 2024-04-10 ENCOUNTER — Inpatient Hospital Stay

## 2024-04-10 ENCOUNTER — Ambulatory Visit: Admitting: Radiation Oncology

## 2024-04-11 ENCOUNTER — Ambulatory Visit

## 2024-04-11 ENCOUNTER — Other Ambulatory Visit: Payer: Self-pay

## 2024-04-12 NOTE — Progress Notes (Signed)
 Pharmacist Chemotherapy Monitoring - Initial Assessment    Anticipated start date: 04/13/24   The following has been reviewed per standard work regarding the patient's treatment regimen: The patient's diagnosis, treatment plan and drug doses, and organ/hematologic function Lab orders and baseline tests specific to treatment regimen  The treatment plan start date, drug sequencing, and pre-medications Prior authorization status  Patient's documented medication list, including drug-drug interaction screen and prescriptions for anti-emetics and supportive care specific to the treatment regimen The drug concentrations, fluid compatibility, administration routes, and timing of the medications to be used The patient's access for treatment and lifetime cumulative dose history, if applicable  The patient's medication allergies and previous infusion related reactions, if applicable   Changes made to treatment plan:  N/A  Follow up needed:  1) Monitor Scr/Crcl. May need to decrease Carbo AUC and Alimta  to 375-400mg /m2 depending on CrCl  2) No DDI noted with Turmeric and chemotherapy   Gabrielle [name], RPH, BCPS, BCOP 04/12/2024   1:18 PM

## 2024-04-13 ENCOUNTER — Ambulatory Visit: Payer: Self-pay

## 2024-04-13 ENCOUNTER — Inpatient Hospital Stay

## 2024-04-13 ENCOUNTER — Inpatient Hospital Stay: Attending: Internal Medicine

## 2024-04-13 ENCOUNTER — Inpatient Hospital Stay: Admitting: Physician Assistant

## 2024-04-13 ENCOUNTER — Inpatient Hospital Stay: Admitting: Hematology

## 2024-04-13 VITALS — BP 153/70 | HR 56 | Temp 97.7°F | Resp 20 | Wt 162.8 lb

## 2024-04-13 DIAGNOSIS — C3492 Malignant neoplasm of unspecified part of left bronchus or lung: Secondary | ICD-10-CM

## 2024-04-13 LAB — CBC WITH DIFFERENTIAL (CANCER CENTER ONLY)
Abs Immature Granulocytes: 0.01 10*3/uL (ref 0.00–0.07)
Basophils Absolute: 0.1 10*3/uL (ref 0.0–0.1)
Basophils Relative: 1 %
Eosinophils Absolute: 0.2 10*3/uL (ref 0.0–0.5)
Eosinophils Relative: 5 %
HCT: 36.3 % (ref 36.0–46.0)
Hemoglobin: 11.7 g/dL — ABNORMAL LOW (ref 12.0–15.0)
Immature Granulocytes: 0 %
Lymphocytes Relative: 20 %
Lymphs Abs: 0.9 10*3/uL (ref 0.7–4.0)
MCH: 29 pg (ref 26.0–34.0)
MCHC: 32.2 g/dL (ref 30.0–36.0)
MCV: 89.9 fL (ref 80.0–100.0)
Monocytes Absolute: 0.3 10*3/uL (ref 0.1–1.0)
Monocytes Relative: 7 %
Neutro Abs: 3.1 10*3/uL (ref 1.7–7.7)
Neutrophils Relative %: 67 %
Platelet Count: 179 10*3/uL (ref 150–400)
RBC: 4.04 MIL/uL (ref 3.87–5.11)
RDW: 13.1 % (ref 11.5–15.5)
WBC Count: 4.6 10*3/uL (ref 4.0–10.5)
nRBC: 0 % (ref 0.0–0.2)

## 2024-04-13 LAB — TSH: TSH: 1.49 u[IU]/mL (ref 0.350–4.500)

## 2024-04-13 LAB — CMP (CANCER CENTER ONLY)
ALT: 15 U/L (ref 0–44)
AST: 14 U/L — ABNORMAL LOW (ref 15–41)
Albumin: 4.3 g/dL (ref 3.5–5.0)
Alkaline Phosphatase: 79 U/L (ref 38–126)
Anion gap: 11 (ref 5–15)
BUN: 28 mg/dL — ABNORMAL HIGH (ref 8–23)
CO2: 23 mmol/L (ref 22–32)
Calcium: 9.4 mg/dL (ref 8.9–10.3)
Chloride: 104 mmol/L (ref 98–111)
Creatinine: 1.26 mg/dL — ABNORMAL HIGH (ref 0.44–1.00)
GFR, Estimated: 46 mL/min — ABNORMAL LOW
Glucose, Bld: 174 mg/dL — ABNORMAL HIGH (ref 70–99)
Potassium: 4.3 mmol/L (ref 3.5–5.1)
Sodium: 139 mmol/L (ref 135–145)
Total Bilirubin: 0.2 mg/dL (ref 0.0–1.2)
Total Protein: 7.1 g/dL (ref 6.5–8.1)

## 2024-04-13 LAB — T4, FREE: Free T4: 1.13 ng/dL (ref 0.80–2.00)

## 2024-04-13 MED ORDER — SODIUM CHLORIDE 0.9 % IV SOLN
366.5000 mg | Freq: Once | INTRAVENOUS | Status: AC
  Administered 2024-04-13: 370 mg via INTRAVENOUS
  Filled 2024-04-13: qty 37

## 2024-04-13 MED ORDER — SODIUM CHLORIDE 0.9 % IV SOLN
500.0000 mg/m2 | Freq: Once | INTRAVENOUS | Status: AC
  Administered 2024-04-13: 900 mg via INTRAVENOUS
  Filled 2024-04-13: qty 20

## 2024-04-13 MED ORDER — APREPITANT 130 MG/18ML IV EMUL
130.0000 mg | Freq: Once | INTRAVENOUS | Status: AC
  Administered 2024-04-13: 130 mg via INTRAVENOUS
  Filled 2024-04-13: qty 18

## 2024-04-13 MED ORDER — SODIUM CHLORIDE 0.9 % IV SOLN: INTRAVENOUS | Status: DC

## 2024-04-13 MED ORDER — DEXAMETHASONE SOD PHOSPHATE PF 10 MG/ML IJ SOLN
10.0000 mg | Freq: Once | INTRAMUSCULAR | Status: AC
  Administered 2024-04-13: 10 mg via INTRAVENOUS
  Filled 2024-04-13: qty 1

## 2024-04-13 MED ORDER — PALONOSETRON HCL INJECTION 0.25 MG/5ML
0.2500 mg | Freq: Once | INTRAVENOUS | Status: AC
  Administered 2024-04-13: 0.25 mg via INTRAVENOUS
  Filled 2024-04-13: qty 5

## 2024-04-13 MED ORDER — SODIUM CHLORIDE 0.9 % IV SOLN
200.0000 mg | Freq: Once | INTRAVENOUS | Status: AC
  Administered 2024-04-13: 200 mg via INTRAVENOUS
  Filled 2024-04-13: qty 200

## 2024-04-13 NOTE — Patient Instructions (Signed)
 [facility] - A DEPT OF [facility]  Discharge Instructions: Thank you for choosing [facility] to provide your oncology and hematology care.   If you have a lab appointment with the Cancer Center, please go directly to the Cancer Center and check in at the registration area.   Wear comfortable clothing and clothing appropriate for easy access to any Portacath or PICC line.   We strive to give you quality time with your provider. You may need to reschedule your appointment if you arrive late (15 or more minutes).  Arriving late affects you and other patients whose appointments are after yours.  Also, if you miss three or more appointments without notifying the office, you may be dismissed from the clinic at the providers discretion.      For prescription refill requests, have your pharmacy contact our office and allow 72 hours for refills to be completed.    Today you received the following chemotherapy and/or immunotherapy agents Keytruda /Alimta /Carboplatin       To help prevent nausea and vomiting after your treatment, we encourage you to take your nausea medication as directed.  BELOW ARE SYMPTOMS THAT SHOULD BE REPORTED IMMEDIATELY: *FEVER GREATER THAN 100.4 F (38 C) OR HIGHER *CHILLS OR SWEATING *NAUSEA AND VOMITING THAT IS NOT CONTROLLED WITH YOUR NAUSEA MEDICATION *UNUSUAL SHORTNESS OF BREATH *UNUSUAL BRUISING OR BLEEDING *URINARY PROBLEMS (pain or burning when urinating, or frequent urination) *BOWEL PROBLEMS (unusual diarrhea, constipation, pain near the anus) TENDERNESS IN MOUTH AND THROAT WITH OR WITHOUT PRESENCE OF ULCERS (sore throat, sores in mouth, or a toothache) UNUSUAL RASH, SWELLING OR PAIN  UNUSUAL VAGINAL DISCHARGE OR ITCHING   Items with * indicate a potential emergency and should be followed up as soon as possible or go to the Emergency Department if any problems should occur.  Please show the CHEMOTHERAPY ALERT CARD  or IMMUNOTHERAPY ALERT CARD at check-in to the Emergency Department and triage nurse.  Should you have questions after your visit or need to cancel or reschedule your appointment, please contact [facility] - A DEPT OF [facility]  Dept: [phone]  and follow the prompts.  Office hours are 8:00 a.m. to 4:30 p.m. Monday - Friday. Please note that voicemails left after 4:00 p.m. may not be returned until the following business day.  We are closed weekends and major holidays. You have access to a nurse at all times for urgent questions. Please call the main number to the clinic Dept: [phone] and follow the prompts.   For any non-urgent questions, you may also contact your provider using MyChart. We now offer e-Visits for anyone 30 and older to request care online for non-urgent symptoms. For details visit [email or web address].   Also download the MyChart app! Go to the app store, search MyChart, open the app, select [facility], and log in with your MyChart username and password.  Pembrolizumab  Injection What is this medication? PEMBROLIZUMAB  (PEM broe LIZ ue mab) treats some types of cancer. It works by helping your immune system slow or stop the spread of cancer cells. It is a monoclonal antibody. This medicine may be used for other purposes; ask your health care provider or pharmacist if you have questions. COMMON BRAND NAME(S): Keytruda  What should I tell my care team before I take this medication? They need to know if you have any of these conditions: Autoimmune conditions, such as Crohn disease, ulcerative colitis, lupus  Have had radiation therapy to your chest area Have received or plan to receive a stem cell transplant that uses donor stem cells (allogeneic) Nervous system problems, such as Guillain-Barre syndrome or myasthenia gravis Organ or tissue transplant An unusual or allergic reaction to pembrolizumab , other medications, foods, dyes, or  preservatives Pregnant or trying to get pregnant Breastfeeding How should I use this medication? This medication is injected into a vein. It is given by your care team in a hospital or clinic setting. A special MedGuide will be given to you before each treatment. Be sure to read this information carefully each time. Talk to your care team about the use of this medication in children. While it may be prescribed for children as young as 6 months for selected conditions, precautions do apply. Overdosage: If you think you have taken too much of this medicine contact a poison control center or emergency room at once. NOTE: This medicine is only for you. Do not share this medicine with others. What if I miss a dose? Keep appointments for follow-up doses. It is important not to miss your dose. Call your care team if you are unable to keep an appointment. What may interact with this medication? Interactions have not been studied. This list may not describe all possible interactions. Give your health care provider a list of all the medicines, herbs, non-prescription drugs, or dietary supplements you use. Also tell them if you smoke, drink alcohol, or use illegal drugs. Some items may interact with your medicine. What should I watch for while using this medication? Your condition will be monitored carefully while you are receiving this medication. You may need blood work while taking this medication. This medication may cause serious skin reactions. They can happen weeks to months after starting the medication. Contact your care team right away if you notice fevers or flu-like symptoms with a rash. The rash may be red or purple and then turn into blisters or peeling of the skin. You may also notice a red rash with swelling of the face, lips, or lymph nodes in your neck or under your arms. Tell your care team right away if you have any change in your eyesight. Talk to your care team if you may be pregnant.  Serious birth defects can occur if you take this medication during pregnancy and for 4 months after the last dose. You will need a negative pregnancy test before starting this medication. Contraception is recommended while taking this medication and for 4 months after the last dose. Your care team can help you find the option that works for you. Do not breastfeed while taking this medication and for 4 months after the last dose. What side effects may I notice from receiving this medication? Side effects that you should report to your care team as soon as possible: Allergic reactions--skin rash, itching, hives, swelling of the face, lips, tongue, or throat Dry cough, shortness of breath or trouble breathing Eye pain, redness, irritation, or discharge with blurry or decreased vision Heart muscle inflammation--unusual weakness or fatigue, shortness of breath, chest pain, fast or irregular heartbeat, dizziness, swelling of the ankles, feet, or hands Hormone gland problems--headache, sensitivity to light, unusual weakness or fatigue, dizziness, fast or irregular heartbeat, increased sensitivity to cold or heat, excessive sweating, constipation, hair loss, increased thirst or amount of urine, tremors or shaking, irritability Infusion reactions--chest pain, shortness of breath or trouble breathing, feeling faint or lightheaded Kidney injury (glomerulonephritis)--decrease in the amount of urine, red or  dark brown urine, foamy or bubbly urine, swelling of the ankles, hands, or feet Liver injury--right upper belly pain, loss of appetite, nausea, light-colored stool, dark yellow or brown urine, yellowing skin or eyes, unusual weakness or fatigue Pain, tingling, or numbness in the hands or feet, muscle weakness, change in vision, confusion or trouble speaking, loss of balance or coordination, trouble walking, seizures Rash, fever, and swollen lymph nodes Redness, blistering, peeling, or loosening of the skin,  including inside the mouth Sudden or severe stomach pain, bloody diarrhea, fever, nausea, vomiting Side effects that usually do not require medical attention (report to your care team if they continue or are bothersome): Bone, joint, or muscle pain Diarrhea Fatigue Loss of appetite Nausea Skin rash This list may not describe all possible side effects. Call your doctor for medical advice about side effects. You may report side effects to FDA at 1-800-FDA-1088. Where should I keep my medication? This medication is given in a hospital or clinic. It will not be stored at home. NOTE: This sheet is a summary. It may not cover all possible information. If you have questions about this medicine, talk to your doctor, pharmacist, or health care provider.  2025 Elsevier/Gold Standard (2023-12-27 00:00:00)  Pemetrexed  Injection What is this medication? PEMETREXED  (PEM e TREX ed) treats some types of cancer. It works by slowing down the growth of cancer cells. This medicine may be used for other purposes; ask your health care provider or pharmacist if you have questions. COMMON BRAND NAME(S): Alimta , PEMFEXY, PEMRYDI RTU What should I tell my care team before I take this medication? They need to know if you have any of these conditions: Infection, such as chickenpox, cold sores, or herpes Kidney disease Low blood cell levels (white cells, red cells, and platelets) Lung or breathing disease, such as asthma Radiation therapy An unusual or allergic reaction to pemetrexed , other medications, foods, dyes, or preservatives If you or your partner are pregnant or trying to get pregnant Breast-feeding How should I use this medication? This medication is injected into a vein. It is given by your care team in a hospital or clinic setting. Talk to your care team about the use of this medication in children. Special care may be needed. Overdosage: If you think you have taken too much of this medicine contact  a poison control center or emergency room at once. NOTE: This medicine is only for you. Do not share this medicine with others. What if I miss a dose? Keep appointments for follow-up doses. It is important not to miss your dose. Call your care team if you are unable to keep an appointment. What may interact with this medication? Do not take this medication with any of the following: Live virus vaccines This medication may also interact with the following: Ibuprofen This list may not describe all possible interactions. Give your health care provider a list of all the medicines, herbs, non-prescription drugs, or dietary supplements you use. Also tell them if you smoke, drink alcohol, or use illegal drugs. Some items may interact with your medicine. What should I watch for while using this medication? Your condition will be monitored carefully while you are receiving this medication. This medication may make you feel generally unwell. This is not uncommon as chemotherapy can affect healthy cells as well as cancer cells. Report any side effects. Continue your course of treatment even though you feel ill unless your care team tells you to stop. This medication can cause serious side effects.  To reduce the risk, your care team may give you other medications to take before receiving this one. Be sure to follow the directions from your care team. This medication can cause a rash or redness in areas of the body that have previously had radiation therapy. If you have had radiation therapy, tell your care team if you notice a rash in this area. This medication may increase your risk of getting an infection. Call your care team for advice if you get a fever, chills, sore throat, or other symptoms of a cold or flu. Do not treat yourself. Try to avoid being around people who are sick. Be careful brushing or flossing your teeth or using a toothpick because you may get an infection or bleed more easily. If you have  any dental work done, tell your dentist you are receiving this medication. Avoid taking medications that contain aspirin, acetaminophen, ibuprofen, naproxen, or ketoprofen unless instructed by your care team. These medications may hide a fever. Check with your care team if you have severe diarrhea, nausea, and vomiting, or if you sweat a lot. The loss of too much body fluid may make it dangerous for you to take this medication. Talk to your care team if you or your partner wish to become pregnant or think either of you might be pregnant. This medication can cause serious birth defects if taken during pregnancy and for 6 months after the last dose. A negative pregnancy test is required before starting this medication. A reliable form of contraception is recommended while taking this medication and for 6 months after the last dose. Talk to your care team about reliable forms of contraception. Do not father a child while taking this medication and for 3 months after the last dose. Use a condom while having sex during this time period. Do not breastfeed while taking this medication and for 1 week after the last dose. This medication may cause infertility. Talk to your care team if you are concerned about your fertility. What side effects may I notice from receiving this medication? Side effects that you should report to your care team as soon as possible: Allergic reactions--skin rash, itching, hives, swelling of the face, lips, tongue, or throat Dry cough, shortness of breath or trouble breathing Infection--fever, chills, cough, sore throat, wounds that don't heal, pain or trouble when passing urine, general feeling of discomfort or being unwell Kidney injury--decrease in the amount of urine, swelling of the ankles, hands, or feet Low red blood cell level--unusual weakness or fatigue, dizziness, headache, trouble breathing Redness, blistering, peeling, or loosening of the skin, including inside the  mouth Unusual bruising or bleeding Side effects that usually do not require medical attention (report to your care team if they continue or are bothersome): Fatigue Loss of appetite Nausea Vomiting This list may not describe all possible side effects. Call your doctor for medical advice about side effects. You may report side effects to FDA at 1-800-FDA-1088. Where should I keep my medication? This medication is given in a hospital or clinic. It will not be stored at home. NOTE: This sheet is a summary. It may not cover all possible information. If you have questions about this medicine, talk to your doctor, pharmacist, or health care provider.  2024 Elsevier/Gold Standard (2021-07-01 00:00:00)  Carboplatin  Injection What is this medication? CARBOPLATIN  (KAR boe pla tin) treats some types of cancer. It works by slowing down the growth of cancer cells. This medicine may be used for other purposes;  ask your health care provider or pharmacist if you have questions. COMMON BRAND NAME(S): Paraplatin  What should I tell my care team before I take this medication? They need to know if you have any of these conditions: Blood disorders Hearing problems Kidney disease Recent or ongoing radiation therapy An unusual or allergic reaction to carboplatin , cisplatin, other medications, foods, dyes, or preservatives Pregnant or trying to get pregnant Breast-feeding How should I use this medication? This medication is injected into a vein. It is given by your care team in a hospital or clinic setting. Talk to your care team about the use of this medication in children. Special care may be needed. Overdosage: If you think you have taken too much of this medicine contact a poison control center or emergency room at once. NOTE: This medicine is only for you. Do not share this medicine with others. What if I miss a dose? Keep appointments for follow-up doses. It is important not to miss your dose. Call  your care team if you are unable to keep an appointment. What may interact with this medication? Medications for seizures Some antibiotics, such as amikacin, gentamicin, neomycin, streptomycin, tobramycin Vaccines This list may not describe all possible interactions. Give your health care provider a list of all the medicines, herbs, non-prescription drugs, or dietary supplements you use. Also tell them if you smoke, drink alcohol, or use illegal drugs. Some items may interact with your medicine. What should I watch for while using this medication? Your condition will be monitored carefully while you are receiving this medication. You may need blood work while taking this medication. This medication may make you feel generally unwell. This is not uncommon, as chemotherapy can affect healthy cells as well as cancer cells. Report any side effects. Continue your course of treatment even though you feel ill unless your care team tells you to stop. In some cases, you may be given additional medications to help with side effects. Follow all directions for their use. This medication may increase your risk of getting an infection. Call your care team for advice if you get a fever, chills, sore throat, or other symptoms of a cold or flu. Do not treat yourself. Try to avoid being around people who are sick. Avoid taking medications that contain aspirin, acetaminophen, ibuprofen, naproxen, or ketoprofen unless instructed by your care team. These medications may hide a fever. Be careful brushing or flossing your teeth or using a toothpick because you may get an infection or bleed more easily. If you have any dental work done, tell your dentist you are receiving this medication. Talk to your care team if you wish to become pregnant or think you might be pregnant. This medication can cause serious birth defects. Talk to your care team about effective forms of contraception. Do not breast-feed while taking this  medication. What side effects may I notice from receiving this medication? Side effects that you should report to your care team as soon as possible: Allergic reactions--skin rash, itching, hives, swelling of the face, lips, tongue, or throat Infection--fever, chills, cough, sore throat, wounds that don't heal, pain or trouble when passing urine, general feeling of discomfort or being unwell Low red blood cell level--unusual weakness or fatigue, dizziness, headache, trouble breathing Pain, tingling, or numbness in the hands or feet, muscle weakness, change in vision, confusion or trouble speaking, loss of balance or coordination, trouble walking, seizures Unusual bruising or bleeding Side effects that usually do not require medical attention (report to  your care team if they continue or are bothersome): Hair loss Nausea Unusual weakness or fatigue Vomiting This list may not describe all possible side effects. Call your doctor for medical advice about side effects. You may report side effects to FDA at 1-800-FDA-1088. Where should I keep my medication? This medication is given in a hospital or clinic. It will not be stored at home. NOTE: This sheet is a summary. It may not cover all possible information. If you have questions about this medicine, talk to your doctor, pharmacist, or health care provider.  2024 Elsevier/Gold Standard (2021-06-17 00:00:00)

## 2024-04-17 ENCOUNTER — Other Ambulatory Visit

## 2024-04-20 ENCOUNTER — Inpatient Hospital Stay: Admitting: Physician Assistant

## 2024-04-20 ENCOUNTER — Inpatient Hospital Stay

## 2024-04-21 ENCOUNTER — Ambulatory Visit

## 2024-04-24 ENCOUNTER — Other Ambulatory Visit

## 2024-04-25 ENCOUNTER — Other Ambulatory Visit

## 2024-04-26 ENCOUNTER — Other Ambulatory Visit

## 2024-04-27 ENCOUNTER — Inpatient Hospital Stay

## 2024-04-28 ENCOUNTER — Ambulatory Visit

## 2024-05-01 ENCOUNTER — Inpatient Hospital Stay: Attending: Internal Medicine

## 2024-05-01 ENCOUNTER — Inpatient Hospital Stay

## 2024-05-02 ENCOUNTER — Ambulatory Visit: Admitting: Radiation Oncology

## 2024-05-04 ENCOUNTER — Inpatient Hospital Stay

## 2024-05-04 ENCOUNTER — Inpatient Hospital Stay: Admitting: Internal Medicine

## 2024-05-09 ENCOUNTER — Inpatient Hospital Stay: Admitting: Internal Medicine

## 2024-05-11 ENCOUNTER — Inpatient Hospital Stay: Attending: Internal Medicine

## 2024-05-18 ENCOUNTER — Inpatient Hospital Stay

## 2024-05-25 ENCOUNTER — Inpatient Hospital Stay: Admitting: Internal Medicine

## 2024-05-25 ENCOUNTER — Inpatient Hospital Stay
